# Patient Record
Sex: Male | Born: 1960 | Race: Black or African American | Hispanic: No | Marital: Single | Smoking: Current every day smoker
Health system: Southern US, Community
[De-identification: ages and names within clinical notes are randomized; demographics above are authoritative.]

## PROBLEM LIST (undated history)

## (undated) HISTORY — PX: FACIAL RECONSTRUCTION SURGERY: SHX631

---

## 2013-04-24 ENCOUNTER — Emergency Department: Payer: Self-pay | Admitting: Emergency Medicine

## 2013-04-24 LAB — COMPREHENSIVE METABOLIC PANEL
ALT: 109 U/L — AB (ref 12–78)
Albumin: 3.9 g/dL (ref 3.4–5.0)
Alkaline Phosphatase: 104 U/L
Anion Gap: 6 — ABNORMAL LOW (ref 7–16)
BILIRUBIN TOTAL: 0.5 mg/dL (ref 0.2–1.0)
BUN: 4 mg/dL — AB (ref 7–18)
CALCIUM: 8.8 mg/dL (ref 8.5–10.1)
CHLORIDE: 99 mmol/L (ref 98–107)
Co2: 28 mmol/L (ref 21–32)
Creatinine: 0.68 mg/dL (ref 0.60–1.30)
EGFR (Non-African Amer.): >60
GLUCOSE: 85 mg/dL (ref 65–99)
OSMOLALITY: 263 (ref 275–301)
POTASSIUM: 4.2 mmol/L (ref 3.5–5.1)
SGOT(AST): 140 U/L — ABNORMAL HIGH (ref 15–37)
Sodium: 133 mmol/L — ABNORMAL LOW (ref 136–145)
TOTAL PROTEIN: 9 g/dL — AB (ref 6.4–8.2)

## 2013-04-24 LAB — URINALYSIS, COMPLETE
Bacteria: NONE SEEN
Bilirubin,UR: NEGATIVE
Glucose,UR: NEGATIVE mg/dL (ref 0–75)
Ketone: NEGATIVE
LEUKOCYTE ESTERASE: NEGATIVE
Nitrite: NEGATIVE
Ph: 6 (ref 4.5–8.0)
Protein: 30
RBC,UR: <1 /HPF (ref 0–5)
SPECIFIC GRAVITY: 1.003 (ref 1.003–1.030)
Squamous Epithelial: NONE SEEN

## 2013-04-24 LAB — CBC
HCT: 43.7 % (ref 40.0–52.0)
HGB: 15 g/dL (ref 13.0–18.0)
MCH: 32.4 pg (ref 26.0–34.0)
MCHC: 34.3 g/dL (ref 32.0–36.0)
MCV: 95 fL (ref 80–100)
Platelet: 65 10*3/uL — ABNORMAL LOW (ref 150–440)
RBC: 4.62 10*6/uL (ref 4.40–5.90)
RDW: 14.4 % (ref 11.5–14.5)
WBC: 5.2 10*3/uL (ref 3.8–10.6)

## 2013-04-24 LAB — LIPASE, BLOOD: LIPASE: 376 U/L (ref 73–393)

## 2013-08-19 ENCOUNTER — Emergency Department: Payer: Self-pay | Admitting: Emergency Medicine

## 2015-09-10 ENCOUNTER — Emergency Department: Payer: Self-pay

## 2015-09-10 DIAGNOSIS — R55 Syncope and collapse: Secondary | ICD-10-CM | POA: Insufficient documentation

## 2015-09-10 LAB — CBC
HEMATOCRIT: 38.4 % — AB (ref 40.0–52.0)
HEMOGLOBIN: 13.2 g/dL (ref 13.0–18.0)
MCH: 33.7 pg (ref 26.0–34.0)
MCHC: 34.3 g/dL (ref 32.0–36.0)
MCV: 98.1 fL (ref 80.0–100.0)
Platelets: 115 10*3/uL — ABNORMAL LOW (ref 150–440)
RBC: 3.92 MIL/uL — AB (ref 4.40–5.90)
RDW: 14.2 % (ref 11.5–14.5)
WBC: 5.4 10*3/uL (ref 3.8–10.6)

## 2015-09-10 LAB — URINALYSIS COMPLETE WITH MICROSCOPIC (ARMC ONLY)
Bilirubin Urine: NEGATIVE
Glucose, UA: NEGATIVE mg/dL
HGB URINE DIPSTICK: NEGATIVE
KETONES UR: NEGATIVE mg/dL
NITRITE: NEGATIVE
PH: 6 (ref 5.0–8.0)
PROTEIN: NEGATIVE mg/dL
SPECIFIC GRAVITY, URINE: 1.005 (ref 1.005–1.030)

## 2015-09-10 LAB — BASIC METABOLIC PANEL
ANION GAP: 8 (ref 5–15)
BUN: 6 mg/dL (ref 6–20)
CHLORIDE: 97 mmol/L — AB (ref 101–111)
CO2: 28 mmol/L (ref 22–32)
Calcium: 9.1 mg/dL (ref 8.9–10.3)
Creatinine, Ser: 0.74 mg/dL (ref 0.61–1.24)
GFR calc non Af Amer: >60 mL/min (ref >60)
Glucose, Bld: 109 mg/dL — ABNORMAL HIGH (ref 65–99)
Potassium: 3.9 mmol/L (ref 3.5–5.1)
Sodium: 133 mmol/L — ABNORMAL LOW (ref 135–145)

## 2015-09-10 NOTE — ED Notes (Signed)
Patient reports thought he had eaten something funny so he went to the bathroom to have a bowel movement.  States when he got up to clean himself he got sweaty and passed out x 2.  Wife states that patient has had diarrhea x 2 weeks and complaining of pain down his entire left side.  Wife reports that patient head his head on the tub when he fell.  Wife also reports that patient has appeared off to her for the past 2 weeks.

## 2015-09-10 NOTE — ED Notes (Signed)
Patient here via EMS for syncopal episode while on the toilet. Admits to drinking several beers today. Wife was insistent that he come to the ER because he "passed out." Patient is ambulatory with steady gait. Initial BP 90's, with EMS pressure was 140's.

## 2015-09-11 ENCOUNTER — Emergency Department: Payer: Self-pay

## 2015-09-11 ENCOUNTER — Emergency Department
Admission: EM | Admit: 2015-09-11 | Discharge: 2015-09-11 | Disposition: A | Discharge status: Home / Self Care | Payer: Self-pay | Attending: Emergency Medicine | Admitting: Emergency Medicine

## 2015-09-11 DIAGNOSIS — R55 Syncope and collapse: Secondary | ICD-10-CM

## 2015-09-11 MED ORDER — IBUPROFEN 600 MG PO TABS
600.0000 mg | ORAL_TABLET | ORAL | Status: AC
  Administered 2015-09-11: 600 mg via ORAL
  Filled 2015-09-11: qty 1

## 2015-09-11 NOTE — ED Provider Notes (Signed)
[facility] Emergency Department Provider Note  ____________________________________________  Time seen: Approximately 4:24 AM  I have reviewed the triage vital signs and the nursing notes.   HISTORY  Chief Complaint Loss of Consciousness    HPI Willie Baker is a 55 y.o. male denies previous medical history.  Patient reports that he was using the bathroom today, had a somewhat loose stool and while using the bathroom "feel lightheaded and then passed out. His wife came in and stood him up and then he passed out a second time very briefly. He recalls event except for a brief episode where he passed out. There is no seizure. He is not any chest pain, palpitations, shortness of breath.  Currently reports he feels fine.  No past medical history on file.  There are no active problems to display for this patient.   No past surgical history on file.  No current outpatient prescriptions on file.  Allergies Review of patient's allergies indicates not on file.  No family history on file.  Social History Social History  Substance Use Topics  . Smoking status: Not on file  . Smokeless tobacco: Not on file  . Alcohol Use: Not on file  He does smoke, one pack a day He does use 2-3 beers daily Denies illicit drug use  Review of Systems Constitutional: No fever/chills Eyes: No visual changes. ENT: No sore throat. Cardiovascular: Denies chest pain. Respiratory: Denies shortness of breath. Gastrointestinal: No abdominal pain.  No nausea, no vomiting.  No diarrhea.  No constipation. Genitourinary: Negative for dysuria. Musculoskeletal: Negative for back pain. Reports the big toe feels slightly sore over his right foot, and no numbness tingling or weakness. Skin: Negative for rash. Neurological: Negative for headaches, focal weakness or numbness.  10-point ROS otherwise negative.  ____________________________________________   PHYSICAL  EXAM:  VITAL SIGNS: ED Triage Vitals  Enc Vitals Group     BP 09/10/15 1942 101/77 mmHg     Pulse Rate 09/10/15 1942 80     Resp 09/10/15 1942 20     Temp 09/10/15 1942 97.8 F (36.6 C)     Temp Source 09/10/15 1942 Oral     SpO2 09/10/15 1942 98 %     Weight 09/10/15 1942 170 lb (77.111 kg)     Height 09/10/15 1942 6\' 1"  (1.854 m)     Head Cir --      Peak Flow --      Pain Score 09/10/15 1943 4     Pain Loc --      Pain Edu? --      Excl. in GC? --    Constitutional: Alert and oriented. Well appearing and in no acute distress. Eyes: Conjunctivae are normal. PERRL. EOMI. Head: Atraumatic. Nose: No congestion/rhinnorhea. Mouth/Throat: Mucous membranes are moist.  Oropharynx non-erythematous. Neck: No stridor.  No cervical spine tenderness Cardiovascular: Normal rate, regular rhythm. Grossly normal heart sounds.  Good peripheral circulation. Respiratory: Normal respiratory effort.  No retractions. Lungs CTAB. Gastrointestinal: Soft and nontender. No distention.  Musculoskeletal: No lower extremity tenderness nor edema.  No joint effusions. Neurologic:  Normal speech and language. No gross focal neurologic deficits are appreciated. No gait instability. Skin:  Skin is warm, dry and intact. No rash noted. Psychiatric: Mood and affect are normal. Speech and behavior are normal.  ____________________________________________   LABS (all labs ordered are listed, but only abnormal results are displayed)  Labs Reviewed  BASIC METABOLIC PANEL - Abnormal; Notable for the following:  Sodium 133 (*)    Chloride 97 (*)    Glucose, Bld 109 (*)    All other components within normal limits  CBC - Abnormal; Notable for the following:    RBC 3.92 (*)    HCT 38.4 (*)    Platelets 115 (*)    All other components within normal limits  URINALYSIS COMPLETEWITH MICROSCOPIC ([facility] ONLY) - Abnormal; Notable for the following:    Color, Urine YELLOW (*)    APPearance CLEAR (*)     Leukocytes, UA TRACE (*)    Bacteria, UA RARE (*)    Squamous Epithelial / LPF 0-5 (*)    All other components within normal limits   ____________________________________________  EKG  ED ECG REPORT I, Baran Kuhrt, the attending physician, personally viewed and interpreted this ECG.  Date: 09/10/2015 EKG Time: 1953 Rate: 70 Rhythm: normal sinus rhythm QRS Axis: normal Intervals: normal ST/T Wave abnormalities: normal Conduction Disturbances: none Narrative Interpretation: unremarkable  ____________________________________________  RADIOLOGY  DG Foot Complete Right (Final result) Result time: 09/11/15 03:52:02   Final result by Rad Results In Interface (09/11/15 03:52:02)   Narrative:   CLINICAL DATA: Right foot pain. Medial pain, no known injury.  EXAM: RIGHT FOOT COMPLETE - 3+ VIEW  COMPARISON: None.  FINDINGS: There is no evidence of fracture or dislocation. Questionable bony erosion involving the lateral aspect of the first metatarsal head with overhanging edge. Minimal medial soft tissue thickening about the first metatarsal phalangeal joint.  IMPRESSION: Soft tissue thickening about the medial first metatarsal phalangeal joint. Questionable bony erosion at the first metatarsal head. Findings suggest gout.   Electronically Signed By: Rubye Oaks M.D. On: 09/11/2015 03:52          CT Head Wo Contrast (Final result) Result time: 09/10/15 21:39:33   Final result by Rad Results In Interface (09/10/15 21:39:33)   Narrative:   CLINICAL DATA: Acute onset of diaphoresis and syncope. Recent diarrhea. Hit head on tub. Initial encounter.  EXAM: CT HEAD WITHOUT CONTRAST  TECHNIQUE: Contiguous axial images were obtained from the base of the skull through the vertex without intravenous contrast.  COMPARISON: None.  FINDINGS: There is no evidence of acute infarction, mass lesion, or intra- or extra-axial hemorrhage on CT.  Mild  periventricular white matter change may reflect small vessel ischemic microangiopathy.  The posterior fossa, including the cerebellum, brainstem and fourth ventricle, is within normal limits. The third and lateral ventricles, and basal ganglia are unremarkable in appearance. The cerebral hemispheres are symmetric in appearance, with normal gray-white differentiation. No mass effect or midline shift is seen.  There is no evidence of fracture; visualized osseous structures are unremarkable in appearance. The visualized portions of the orbits are within normal limits. The paranasal sinuses and mastoid air cells are well-aerated. No significant soft tissue abnormalities are seen.  IMPRESSION: 1. No evidence of traumatic intracranial injury or fracture. 2. Mild small vessel ischemic microangiopathy.   Electronically Signed By: Roanna Raider M.D. On: 09/10/2015 21:39       ____________________________________________   PROCEDURES  Procedure(s) performed: None  Critical Care performed: No  ____________________________________________   INITIAL IMPRESSION / ASSESSMENT AND PLAN / ED COURSE  Pertinent labs & imaging results that were available during my care of the patient were reviewed by me and considered in my medical decision making (see chart for details).  Patient does for evaluation of syncope. He reports that he got lightheaded while having a bowel movement, started to lower himself and then fell striking  his head on the floor. He has no evidence of traumatic injury. Nexus negative. He is currently awake and alert with no evidence of neurologic cardiac or pulmonary condition or acuity. He is ambulatory without distress, reports all symptoms resolved. He denies any black or bloody stool, infectious symptoms, abdominal pain or other concerns at this time.  Patient is negative for high risk factors by [facility] syncope, no evidence of arrhythmia, he is  hemodynamically stable.  Labs reassuring. Discussed with the patient, and since he has no primary I suggested that he may wish to follow up and told him he should establish a primary care for further evaluation. Patient is agreeable. Wife will be driving him home. Provided work note for the next couple of days.  Return precautions and treatment recommendations and follow-up discussed with the patient who is agreeable with the plan.  ____________________________________________   FINAL CLINICAL IMPRESSION(S) / ED DIAGNOSES  Final diagnoses:  Syncope and collapse      Sharyn Creamer, MD 09/11/15 [id number]

## 2015-09-11 NOTE — ED Notes (Signed)
Pt presents to ED with c/o lower back pain radiating down right leg. Pt reports was using bathroom, was having diarrhea, when he attempted to stand patient reports became dizzy and diaphoretic and had syncopal episode x 2. Syncopal episode was witnessed by spouse, who states pt hit head on tub. Pt denies hx of the same. Pt alert and oriented x4, skin warm and dry, no increased work in breathing noted.

## 2015-09-11 NOTE — ED Notes (Signed)

## 2015-09-11 NOTE — Discharge Instructions (Signed)
You have been seen today in the Emergency Department (ED)  for syncope (passing out).  Your workup including labs and EKG show reassuring results.  Your symptoms may be due to dehydration, so it is important that you drink plenty of non-alcoholic fluids. ° °Please call your regular doctor as soon as possible to schedule the next available clinic appointment to follow up with him/her regarding your visit to the ED and your symptoms.  Return to the Emergency Department (ED)  if you have any further syncopal episodes (pass out again) or develop ANY chest pain, pressure, tightness, trouble breathing, sudden sweating, or other symptoms that concern you. ° ° ° °Syncope °Syncope is a medical term for fainting or passing out. This means you lose consciousness and drop to the ground. People are generally unconscious for less than 5 minutes. You may have some muscle twitches for up to 15 seconds before waking up and returning to normal. Syncope occurs more often in older adults, but it can happen to anyone. While most causes of syncope are not dangerous, syncope can be a sign of a serious medical problem. It is important to seek medical care.  °CAUSES  °Syncope is caused by a sudden drop in blood flow to the brain. The specific cause is often not determined. Factors that can bring on syncope include: °· Taking medicines that lower blood pressure. °· Sudden changes in posture, such as standing up quickly. °· Taking more medicine than prescribed. °· Standing in one place for too long. °· Seizure disorders. °· Dehydration and excessive exposure to heat. °· Low blood sugar (hypoglycemia). °· Straining to have a bowel movement. °· Heart disease, irregular heartbeat, or other circulatory problems. °· Fear, emotional distress, seeing blood, or severe pain. °SYMPTOMS  °Right before fainting, you may: °· Feel dizzy or light-headed. °· Feel nauseous. °· See all white or all black in your field of vision. °· Have cold, clammy  skin. °DIAGNOSIS  °Your health care provider will ask about your symptoms, perform a physical exam, and perform an electrocardiogram (ECG) to record the electrical activity of your heart. Your health care provider may also perform other heart or blood tests to determine the cause of your syncope which may include: °· Transthoracic echocardiogram (TTE). During echocardiography, sound waves are used to evaluate how blood flows through your heart. °· Transesophageal echocardiogram (TEE). °· Cardiac monitoring. This allows your health care provider to monitor your heart rate and rhythm in real time. °· Holter monitor. This is a portable device that records your heartbeat and can help diagnose heart arrhythmias. It allows your health care provider to track your heart activity for several days, if needed. °· Stress tests by exercise or by giving medicine that makes the heart beat faster. °TREATMENT  °In most cases, no treatment is needed. Depending on the cause of your syncope, your health care provider may recommend changing or stopping some of your medicines. °HOME CARE INSTRUCTIONS °· Have someone stay with you until you feel stable. °· Do not drive, use machinery, or play sports until your health care provider says it is okay. °· Keep all follow-up appointments as directed by your health care provider. °· Lie down right away if you start feeling like you might faint. Breathe deeply and steadily. Wait until all the symptoms have passed. °· Drink enough fluids to keep your urine clear or pale yellow. °· If you are taking blood pressure or heart medicine, get up slowly and take several minutes to sit   and then stand. This can reduce dizziness. °SEEK IMMEDIATE MEDICAL CARE IF:  °· You have a severe headache. °· You have unusual pain in the chest, abdomen, or back. °· You are bleeding from your mouth or rectum, or you have black or tarry stool. °· You have an irregular or very fast heartbeat. °· You have pain with  breathing. °· You have repeated fainting or seizure-like jerking during an episode. °· You faint when sitting or lying down. °· You have confusion. °· You have trouble walking. °· You have severe weakness. °· You have vision problems. °If you fainted, call your local emergency services (911 in [place] Do not drive yourself to the hospital.  °  °This information is not intended to replace advice given to you by your health care provider. Make sure you discuss any questions you have with your health care provider. °  °Document Released: 04/03/2005 Document Revised: 08/18/2014 Document Reviewed: 06/02/2011 °Elsevier Interactive Patient Education ©2016 [employer] ° °

## 2015-10-24 ENCOUNTER — Emergency Department
Admission: EM | Admit: 2015-10-24 | Discharge: 2015-10-24 | Disposition: A | Discharge status: Home / Self Care | Payer: Self-pay | Attending: Emergency Medicine | Admitting: Emergency Medicine

## 2015-10-24 DIAGNOSIS — Z5321 Procedure and treatment not carried out due to patient leaving prior to being seen by health care provider: Secondary | ICD-10-CM | POA: Insufficient documentation

## 2015-10-24 DIAGNOSIS — R066 Hiccough: Secondary | ICD-10-CM | POA: Insufficient documentation

## 2015-10-24 NOTE — ED Notes (Signed)
In room to triage pt and pt reports he is no longer having the hiccups which is what brought him to the ED. Pt states he is going to go home and will return if he needs later.

## 2016-05-24 ENCOUNTER — Inpatient Hospital Stay
Admission: EM | Admit: 2016-05-24 | Discharge: 2016-06-23 | DRG: 896 | Disposition: A | Discharge status: Hospice | Payer: Medicaid Other | Attending: Internal Medicine | Admitting: Internal Medicine

## 2016-05-24 ENCOUNTER — Encounter: Payer: Self-pay | Admitting: Emergency Medicine

## 2016-05-24 DIAGNOSIS — R112 Nausea with vomiting, unspecified: Secondary | ICD-10-CM

## 2016-05-24 DIAGNOSIS — E8809 Other disorders of plasma-protein metabolism, not elsewhere classified: Secondary | ICD-10-CM | POA: Diagnosis not present

## 2016-05-24 DIAGNOSIS — Z6821 Body mass index (BMI) 21.0-21.9, adult: Secondary | ICD-10-CM

## 2016-05-24 DIAGNOSIS — Z515 Encounter for palliative care: Secondary | ICD-10-CM | POA: Diagnosis not present

## 2016-05-24 DIAGNOSIS — E876 Hypokalemia: Secondary | ICD-10-CM | POA: Diagnosis present

## 2016-05-24 DIAGNOSIS — E860 Dehydration: Secondary | ICD-10-CM | POA: Diagnosis present

## 2016-05-24 DIAGNOSIS — R05 Cough: Secondary | ICD-10-CM

## 2016-05-24 DIAGNOSIS — Z7189 Other specified counseling: Secondary | ICD-10-CM

## 2016-05-24 DIAGNOSIS — F10251 Alcohol dependence with alcohol-induced psychotic disorder with hallucinations: Secondary | ICD-10-CM | POA: Diagnosis present

## 2016-05-24 DIAGNOSIS — F10231 Alcohol dependence with withdrawal delirium: Principal | ICD-10-CM | POA: Diagnosis present

## 2016-05-24 DIAGNOSIS — E871 Hypo-osmolality and hyponatremia: Secondary | ICD-10-CM

## 2016-05-24 DIAGNOSIS — Z4659 Encounter for fitting and adjustment of other gastrointestinal appliance and device: Secondary | ICD-10-CM

## 2016-05-24 DIAGNOSIS — R109 Unspecified abdominal pain: Secondary | ICD-10-CM

## 2016-05-24 DIAGNOSIS — K56600 Partial intestinal obstruction, unspecified as to cause: Secondary | ICD-10-CM | POA: Diagnosis present

## 2016-05-24 DIAGNOSIS — E43 Unspecified severe protein-calorie malnutrition: Secondary | ICD-10-CM | POA: Diagnosis present

## 2016-05-24 DIAGNOSIS — K567 Ileus, unspecified: Secondary | ICD-10-CM

## 2016-05-24 DIAGNOSIS — F0280 Dementia in other diseases classified elsewhere without behavioral disturbance: Secondary | ICD-10-CM | POA: Diagnosis present

## 2016-05-24 DIAGNOSIS — F1096 Alcohol use, unspecified with alcohol-induced persisting amnestic disorder: Secondary | ICD-10-CM

## 2016-05-24 DIAGNOSIS — R32 Unspecified urinary incontinence: Secondary | ICD-10-CM | POA: Diagnosis not present

## 2016-05-24 DIAGNOSIS — K56609 Unspecified intestinal obstruction, unspecified as to partial versus complete obstruction: Secondary | ICD-10-CM

## 2016-05-24 DIAGNOSIS — F1026 Alcohol dependence with alcohol-induced persisting amnestic disorder: Secondary | ICD-10-CM | POA: Diagnosis present

## 2016-05-24 DIAGNOSIS — R509 Fever, unspecified: Secondary | ICD-10-CM | POA: Diagnosis not present

## 2016-05-24 DIAGNOSIS — K7010 Alcoholic hepatitis without ascites: Secondary | ICD-10-CM | POA: Diagnosis present

## 2016-05-24 DIAGNOSIS — R000 Tachycardia, unspecified: Secondary | ICD-10-CM | POA: Diagnosis present

## 2016-05-24 DIAGNOSIS — F1010 Alcohol abuse, uncomplicated: Secondary | ICD-10-CM

## 2016-05-24 DIAGNOSIS — R059 Cough, unspecified: Secondary | ICD-10-CM

## 2016-05-24 DIAGNOSIS — Z66 Do not resuscitate: Secondary | ICD-10-CM | POA: Diagnosis not present

## 2016-05-24 DIAGNOSIS — G9340 Encephalopathy, unspecified: Secondary | ICD-10-CM | POA: Diagnosis present

## 2016-05-24 DIAGNOSIS — R410 Disorientation, unspecified: Secondary | ICD-10-CM

## 2016-05-24 DIAGNOSIS — F17200 Nicotine dependence, unspecified, uncomplicated: Secondary | ICD-10-CM | POA: Diagnosis present

## 2016-05-24 LAB — COMPREHENSIVE METABOLIC PANEL
ALT: 48 U/L (ref 17–63)
AST: 110 U/L — ABNORMAL HIGH (ref 15–41)
Albumin: 4 g/dL (ref 3.5–5.0)
Alkaline Phosphatase: 88 U/L (ref 38–126)
Anion gap: 9 (ref 5–15)
BUN: <5 mg/dL — ABNORMAL LOW (ref 6–20)
CHLORIDE: 93 mmol/L — AB (ref 101–111)
CO2: 26 mmol/L (ref 22–32)
Calcium: 8.9 mg/dL (ref 8.9–10.3)
Creatinine, Ser: 0.67 mg/dL (ref 0.61–1.24)
Glucose, Bld: 101 mg/dL — ABNORMAL HIGH (ref 65–99)
POTASSIUM: 3.9 mmol/L (ref 3.5–5.1)
SODIUM: 128 mmol/L — AB (ref 135–145)
Total Bilirubin: 1 mg/dL (ref 0.3–1.2)
Total Protein: 8.3 g/dL — ABNORMAL HIGH (ref 6.5–8.1)

## 2016-05-24 LAB — URINALYSIS, COMPLETE (UACMP) WITH MICROSCOPIC
BACTERIA UA: NONE SEEN
Bilirubin Urine: NEGATIVE
GLUCOSE, UA: NEGATIVE mg/dL
Hgb urine dipstick: NEGATIVE
KETONES UR: NEGATIVE mg/dL
Nitrite: NEGATIVE
PROTEIN: NEGATIVE mg/dL
Specific Gravity, Urine: 1.004 — ABNORMAL LOW (ref 1.005–1.030)
pH: 7 (ref 5.0–8.0)

## 2016-05-24 LAB — CBC
HEMATOCRIT: 39.4 % — AB (ref 40.0–52.0)
HEMOGLOBIN: 13.5 g/dL (ref 13.0–18.0)
MCH: 33.7 pg (ref 26.0–34.0)
MCHC: 34.2 g/dL (ref 32.0–36.0)
MCV: 98.5 fL (ref 80.0–100.0)
Platelets: 102 10*3/uL — ABNORMAL LOW (ref 150–440)
RBC: 4 MIL/uL — AB (ref 4.40–5.90)
RDW: 13.2 % (ref 11.5–14.5)
WBC: 5.9 10*3/uL (ref 3.8–10.6)

## 2016-05-24 LAB — LIPASE, BLOOD: LIPASE: 36 U/L (ref 11–51)

## 2016-05-24 MED ORDER — ONDANSETRON 4 MG PO TBDP
ORAL_TABLET | ORAL | Status: AC
  Filled 2016-05-24: qty 1

## 2016-05-24 MED ORDER — ONDANSETRON 4 MG PO TBDP
4.0000 mg | ORAL_TABLET | Freq: Once | ORAL | Status: AC | PRN
  Administered 2016-05-24: 4 mg via ORAL

## 2016-05-24 MED ORDER — PROCHLORPERAZINE EDISYLATE 5 MG/ML IJ SOLN
10.0000 mg | Freq: Once | INTRAMUSCULAR | Status: AC
  Administered 2016-05-24: 10 mg via INTRAVENOUS
  Filled 2016-05-24: qty 2

## 2016-05-24 MED ORDER — SODIUM CHLORIDE 0.9 % IV BOLUS (SEPSIS)
1000.0000 mL | Freq: Once | INTRAVENOUS | Status: AC
  Administered 2016-05-24: 1000 mL via INTRAVENOUS

## 2016-05-24 NOTE — ED Triage Notes (Signed)
Pt ambulatory to triage in NAD, report n/v/d x 3-4 days, reports fever/chills at home as well.

## 2016-05-24 NOTE — ED Provider Notes (Signed)
[facility] Emergency Department Provider Note  ____________________________________________   I have reviewed the triage vital signs and the nursing notes.   HISTORY  Chief Complaint Emesis   History limited by: Not Limited   HPI Willie Baker is a 56 y.o. male who presents to the emergency department today because of concern for nausea and vomiting. These symptoms have been going on for the past four days. The patient states that he has not been able to keep anything down. The patient has not noticed any blood in his emesis. States that he has also been having some diarrhea. He denies any fevers. Denies any abdominal pain. The patient does state that he drinks roughly 12 beers a day. Denies frequent OTC pain medication use.    History reviewed. No pertinent past medical history.  There are no active problems to display for this patient.   History reviewed. No pertinent surgical history.  Prior to Admission medications   Not on File    Allergies Milk-related compounds  History reviewed. No pertinent family history.  Social History Social History  Substance Use Topics  . Smoking status: Current Every Day Smoker  . Smokeless tobacco: Never Used  . Alcohol use Yes    Review of Systems  Constitutional: Negative for fever. Cardiovascular: Negative for chest pain. Respiratory: Negative for shortness of breath. Gastrointestinal: Negative for abdominal pain. Positive for nausea, vomiting and diarrhea.  Neurological: Negative for headaches, focal weakness or numbness.  10-point ROS otherwise negative.  ____________________________________________   PHYSICAL EXAM:  VITAL SIGNS: ED Triage Vitals  Enc Vitals Group     BP 05/24/16 1902 120/79     Pulse Rate 05/24/16 1902 (!) 103     Resp 05/24/16 1902 20     Temp 05/24/16 1902 99.9 F (37.7 C)     Temp Source 05/24/16 1902 Oral     SpO2 05/24/16 1902 98 %     Weight 05/24/16 1903  160 lb (72.6 kg)     Height 05/24/16 1903 6\' 1"  (1.854 m)     Head Circumference --      Peak Flow --      Pain Score 05/24/16 1904 9   Constitutional: Alert and oriented. Well appearing and in no distress. Eyes: Conjunctivae are normal. Normal extraocular movements. ENT   Head: Normocephalic and atraumatic.   Nose: No congestion/rhinnorhea.   Mouth/Throat: Mucous membranes are moist.   Neck: No stridor. Hematological/Lymphatic/Immunilogical: No cervical lymphadenopathy. Cardiovascular: Tachycardic, regular rhythm.  No murmurs, rubs, or gallops.  Respiratory: Normal respiratory effort without tachypnea nor retractions. Breath sounds are clear and equal bilaterally. No wheezes/rales/rhonchi. Gastrointestinal: Soft and non tender. No rebound. No guarding.  Genitourinary: Deferred Musculoskeletal: Normal range of motion in all extremities. No lower extremity edema. Neurologic:  Normal speech and language. No gross focal neurologic deficits are appreciated.  Skin:  Skin is warm, dry and intact. No rash noted. Psychiatric: Mood and affect are normal. Speech and behavior are normal. Patient exhibits appropriate insight and judgment.  ____________________________________________    LABS (pertinent positives/negatives)  Labs Reviewed  COMPREHENSIVE METABOLIC PANEL - Abnormal; Notable for the following:       Result Value   Sodium 128 (*)    Chloride 93 (*)    Glucose, Bld 101 (*)    BUN <5 (*)    Total Protein 8.3 (*)    AST 110 (*)    All other components within normal limits  CBC - Abnormal; Notable for  the following:    RBC 4.00 (*)    HCT 39.4 (*)    Platelets 102 (*)    All other components within normal limits  URINALYSIS, COMPLETE (UACMP) WITH MICROSCOPIC - Abnormal; Notable for the following:    Color, Urine YELLOW (*)    APPearance CLEAR (*)    Specific Gravity, Urine 1.004 (*)    Leukocytes, UA SMALL (*)    Squamous Epithelial / LPF 0-5 (*)    All other  components within normal limits  LIPASE, BLOOD     ____________________________________________   EKG  I, Phineas SemenGraydon Enaya Howze, attending physician, personally viewed and interpreted this EKG  EKG Time: 0210 Rate: 127 Rhythm: sinus tachycardia Axis: normal Intervals: qtc 451 QRS: narrow, q waves V1 ST changes: no st elevation Impression: abnormal ekg   ____________________________________________    RADIOLOGY  None  ____________________________________________   PROCEDURES  Procedures  ____________________________________________   INITIAL IMPRESSION / ASSESSMENT AND PLAN / ED COURSE  Pertinent labs & imaging results that were available during my care of the patient were reviewed by me and considered in my medical decision making (see chart for details).  Patient presented to the emergency department today because of concerns for nausea and vomiting. Blood work showed that his sodium 1 level was slightly decreased. No addition the patient was tachycardic. He did receive multiple IV fluid boluses here in the emergency department however remained tachycardic. This point I think the tachycardia is probably related both to dehydration and alcohol withdrawal. Will however plan on admission the hospital service for further workup and evaluation.  ____________________________________________   FINAL CLINICAL IMPRESSION(S) / ED DIAGNOSES  Final diagnoses:  Nausea and vomiting, intractability of vomiting not specified, unspecified vomiting type  Hyponatremia     Note: This dictation was prepared with Dragon dictation. Any transcriptional errors that result from this process are unintentional     Phineas SemenGraydon Rahi Chandonnet, MD 05/25/16 [phone]

## 2016-05-25 DIAGNOSIS — R000 Tachycardia, unspecified: Secondary | ICD-10-CM | POA: Diagnosis present

## 2016-05-25 LAB — TSH: TSH: 1.451 u[IU]/mL (ref 0.350–4.500)

## 2016-05-25 LAB — ETHANOL: Alcohol, Ethyl (B): <5 mg/dL (ref <5)

## 2016-05-25 MED ORDER — ADULT MULTIVITAMIN W/MINERALS CH
1.0000 | ORAL_TABLET | Freq: Every day | ORAL | Status: DC
  Administered 2016-05-25 – 2016-05-28 (×3): 1 via ORAL
  Filled 2016-05-25 (×4): qty 1

## 2016-05-25 MED ORDER — SODIUM CHLORIDE 0.9 % IV SOLN
INTRAVENOUS | Status: DC
  Administered 2016-05-25 – 2016-05-26 (×5): via INTRAVENOUS

## 2016-05-25 MED ORDER — DOCUSATE SODIUM 100 MG PO CAPS
100.0000 mg | ORAL_CAPSULE | Freq: Two times a day (BID) | ORAL | Status: DC
  Administered 2016-05-25 – 2016-06-20 (×13): 100 mg via ORAL
  Filled 2016-05-25 (×20): qty 1

## 2016-05-25 MED ORDER — VITAMIN B-1 100 MG PO TABS
100.0000 mg | ORAL_TABLET | Freq: Every day | ORAL | Status: DC
  Administered 2016-05-25 – 2016-06-12 (×7): 100 mg via ORAL
  Filled 2016-05-25 (×10): qty 1

## 2016-05-25 MED ORDER — PANTOPRAZOLE SODIUM 40 MG PO TBEC
40.0000 mg | DELAYED_RELEASE_TABLET | Freq: Every day | ORAL | Status: DC
  Administered 2016-05-25 – 2016-05-28 (×3): 40 mg via ORAL
  Filled 2016-05-25 (×3): qty 1

## 2016-05-25 MED ORDER — LORAZEPAM 0.5 MG PO TABS
1.0000 mg | ORAL_TABLET | Freq: Four times a day (QID) | ORAL | Status: DC | PRN
  Administered 2016-05-25: 21:00:00 1 mg via ORAL
  Filled 2016-05-25: qty 1

## 2016-05-25 MED ORDER — LORAZEPAM 2 MG/ML IJ SOLN
0.0000 mg | Freq: Four times a day (QID) | INTRAMUSCULAR | Status: AC
  Administered 2016-05-25: 23:00:00 2 mg via INTRAVENOUS
  Administered 2016-05-26 (×2): 4 mg via INTRAVENOUS
  Filled 2016-05-25 (×3): qty 2

## 2016-05-25 MED ORDER — FOLIC ACID 1 MG PO TABS
1.0000 mg | ORAL_TABLET | Freq: Every day | ORAL | Status: DC
  Administered 2016-05-25 – 2016-05-28 (×3): 1 mg via ORAL
  Filled 2016-05-25 (×3): qty 1

## 2016-05-25 MED ORDER — LORAZEPAM 2 MG/ML IJ SOLN
1.0000 mg | Freq: Four times a day (QID) | INTRAMUSCULAR | Status: DC | PRN
  Administered 2016-05-26: 1 mg via INTRAVENOUS
  Filled 2016-05-25 (×2): qty 1

## 2016-05-25 MED ORDER — ONDANSETRON HCL 4 MG PO TABS: 4.0000 mg | ORAL_TABLET | Freq: Four times a day (QID) | ORAL | Status: DC | PRN

## 2016-05-25 MED ORDER — SODIUM CHLORIDE 0.9 % IV BOLUS (SEPSIS)
1000.0000 mL | Freq: Once | INTRAVENOUS | Status: AC
  Administered 2016-05-25: 1000 mL via INTRAVENOUS

## 2016-05-25 MED ORDER — METOPROLOL TARTRATE 25 MG PO TABS
25.0000 mg | ORAL_TABLET | Freq: Two times a day (BID) | ORAL | Status: DC
  Administered 2016-05-25 – 2016-05-28 (×6): 25 mg via ORAL
  Filled 2016-05-25 (×6): qty 1

## 2016-05-25 MED ORDER — BOOST / RESOURCE BREEZE PO LIQD
1.0000 | Freq: Three times a day (TID) | ORAL | Status: DC
  Administered 2016-05-27: 1 via ORAL
  Administered 2016-05-27: 237 mL via ORAL
  Administered 2016-05-28: 0.5 via ORAL
  Administered 2016-05-31: 1 via ORAL

## 2016-05-25 MED ORDER — ACETAMINOPHEN 650 MG RE SUPP
650.0000 mg | Freq: Four times a day (QID) | RECTAL | Status: DC | PRN
  Administered 2016-05-28 – 2016-06-03 (×3): 650 mg via RECTAL
  Filled 2016-05-25 (×3): qty 1

## 2016-05-25 MED ORDER — LORAZEPAM 2 MG/ML IJ SOLN
1.0000 mg | Freq: Once | INTRAMUSCULAR | Status: AC
  Administered 2016-05-25: 1 mg via INTRAVENOUS
  Filled 2016-05-25: qty 1

## 2016-05-25 MED ORDER — ONDANSETRON HCL 4 MG/2ML IJ SOLN: 4.0000 mg | Freq: Four times a day (QID) | INTRAMUSCULAR | Status: DC | PRN

## 2016-05-25 MED ORDER — SODIUM CHLORIDE 0.9% FLUSH
3.0000 mL | Freq: Two times a day (BID) | INTRAVENOUS | Status: DC
  Administered 2016-05-25 – 2016-06-13 (×31): 3 mL via INTRAVENOUS
  Administered 2016-06-14: 21:00:00 10 mL via INTRAVENOUS
  Administered 2016-06-14 – 2016-06-15 (×2): 3 mL via INTRAVENOUS
  Administered 2016-06-15: 10 mL via INTRAVENOUS
  Administered 2016-06-16 – 2016-06-22 (×11): 3 mL via INTRAVENOUS

## 2016-05-25 MED ORDER — ACETAMINOPHEN 325 MG PO TABS
650.0000 mg | ORAL_TABLET | Freq: Four times a day (QID) | ORAL | Status: DC | PRN
  Administered 2016-05-25: 20:00:00 650 mg via ORAL
  Filled 2016-05-25: qty 2

## 2016-05-25 MED ORDER — THIAMINE HCL 100 MG/ML IJ SOLN
100.0000 mg | Freq: Every day | INTRAMUSCULAR | Status: DC
  Administered 2016-05-26 – 2016-06-13 (×12): 100 mg via INTRAVENOUS
  Filled 2016-05-25 (×14): qty 2

## 2016-05-25 MED ORDER — ENOXAPARIN SODIUM 40 MG/0.4ML ~~LOC~~ SOLN: 40.0000 mg | SUBCUTANEOUS | Status: DC

## 2016-05-25 MED ORDER — TIOTROPIUM BROMIDE MONOHYDRATE 18 MCG IN CAPS
18.0000 ug | ORAL_CAPSULE | Freq: Every day | RESPIRATORY_TRACT | Status: DC
  Administered 2016-05-25 – 2016-06-13 (×4): 18 ug via RESPIRATORY_TRACT
  Filled 2016-05-25 (×2): qty 5

## 2016-05-25 MED ORDER — LORAZEPAM 2 MG/ML IJ SOLN
0.0000 mg | Freq: Two times a day (BID) | INTRAMUSCULAR | Status: AC
Start: 2016-05-27 — End: 2016-05-28
  Administered 2016-05-27 (×2): 2 mg via INTRAVENOUS
  Administered 2016-05-27 – 2016-05-28 (×2): 4 mg via INTRAVENOUS
  Filled 2016-05-25: qty 2
  Filled 2016-05-25 (×4): qty 1
  Filled 2016-05-25: qty 2

## 2016-05-25 NOTE — H&P (Signed)
Willie Baker is an 56 y.o. male.   Chief Complaint: Abdominal pain HPI: The patient with no past medical history presents emergency department complaining of abdominal pain. He states that he has had multiple episodes of nonbloody nonbilious emesis today. The patient also states that he has been subjectively febrile. Upon presentation the patient did not have a fever but he was tachycardic. After multiple fluid boluses his tachycardia persisted as well as his nausea. The patient admits to being a drinker and has not had any liquor for more than 24 hours. Due to concern for potential withdrawal from alcohol and persistent tachycardia the emergency department staff called the hospitalist service for admission.  History reviewed. No pertinent past medical history.  Past Surgical History:  Procedure Laterality Date  . FACIAL RECONSTRUCTION SURGERY      Family History  Problem Relation Age of Onset  . Breast cancer Mother    Social History:  reports that he has been smoking.  He has never used smokeless tobacco. He reports that he drinks alcohol. He reports that he does not use drugs.  Allergies:  Allergies  Allergen Reactions  . Milk-Related Compounds     No prescriptions prior to admission.    Results for orders placed or performed during the hospital encounter of 05/24/16 (from the past 48 hour(s))  Lipase, blood     Status: None   Collection Time: 05/24/16  7:02 PM  Result Value Ref Range   Lipase 36 11 - 51 U/L  Comprehensive metabolic panel     Status: Abnormal   Collection Time: 05/24/16  7:02 PM  Result Value Ref Range   Sodium 128 (L) 135 - 145 mmol/L   Potassium 3.9 3.5 - 5.1 mmol/L   Chloride 93 (L) 101 - 111 mmol/L   CO2 26 22 - 32 mmol/L   Glucose, Bld 101 (H) 65 - 99 mg/dL   BUN <5 (L) 6 - 20 mg/dL   Creatinine, Ser 0.67 0.61 - 1.24 mg/dL   Calcium 8.9 8.9 - 10.3 mg/dL   Total Protein 8.3 (H) 6.5 - 8.1 g/dL   Albumin 4.0 3.5 - 5.0 g/dL   AST 110 (H) 15 - 41  U/L   ALT 48 17 - 63 U/L   Alkaline Phosphatase 88 38 - 126 U/L   Total Bilirubin 1.0 0.3 - 1.2 mg/dL   GFR calc non Af Amer >60 >60 mL/min   GFR calc Af Amer >60 >60 mL/min    Comment: (NOTE) The eGFR has been calculated using the CKD EPI equation. This calculation has not been validated in all clinical situations. eGFR's persistently <60 mL/min signify possible Chronic Kidney Disease.    Anion gap 9 5 - 15  CBC     Status: Abnormal   Collection Time: 05/24/16  7:02 PM  Result Value Ref Range   WBC 5.9 3.8 - 10.6 K/uL   RBC 4.00 (L) 4.40 - 5.90 MIL/uL   Hemoglobin 13.5 13.0 - 18.0 g/dL   HCT 39.4 (L) 40.0 - 52.0 %   MCV 98.5 80.0 - 100.0 fL   MCH 33.7 26.0 - 34.0 pg   MCHC 34.2 32.0 - 36.0 g/dL   RDW 13.2 11.5 - 14.5 %   Platelets 102 (L) 150 - 440 K/uL  Urinalysis, Complete w Microscopic     Status: Abnormal   Collection Time: 05/24/16  7:02 PM  Result Value Ref Range   Color, Urine YELLOW (A) YELLOW   APPearance CLEAR (A)  CLEAR   Specific Gravity, Urine 1.004 (L) 1.005 - 1.030   pH 7.0 5.0 - 8.0   Glucose, UA NEGATIVE NEGATIVE mg/dL   Hgb urine dipstick NEGATIVE NEGATIVE   Bilirubin Urine NEGATIVE NEGATIVE   Ketones, ur NEGATIVE NEGATIVE mg/dL   Protein, ur NEGATIVE NEGATIVE mg/dL   Nitrite NEGATIVE NEGATIVE   Leukocytes, UA SMALL (A) NEGATIVE   RBC / HPF 0-5 0 - 5 RBC/hpf   WBC, UA 0-5 0 - 5 WBC/hpf   Bacteria, UA NONE SEEN NONE SEEN   Squamous Epithelial / LPF 0-5 (A) NONE SEEN  TSH     Status: None   Collection Time: 05/24/16  7:02 PM  Result Value Ref Range   TSH 1.451 0.350 - 4.500 uIU/mL    Comment: Performed by a 3rd Generation assay with a functional sensitivity of <=0.01 uIU/mL.   No results found.  Review of Systems  Constitutional: Positive for fever (subjective). Negative for chills.  HENT: Negative for sore throat and tinnitus.   Eyes: Negative for blurred vision and redness.  Respiratory: Negative for cough and shortness of breath.    Cardiovascular: Negative for chest pain, palpitations, orthopnea and PND.  Gastrointestinal: Positive for abdominal pain, nausea and vomiting. Negative for diarrhea.  Genitourinary: Negative for dysuria, frequency and urgency.  Musculoskeletal: Negative for joint pain and myalgias.  Skin: Negative for rash.       No lesions  Neurological: Negative for speech change, focal weakness and weakness.  Endo/Heme/Allergies: Does not bruise/bleed easily.       No temperature intolerance  Psychiatric/Behavioral: Negative for depression and suicidal ideas.    Blood pressure (!) 145/80, pulse (!) 111, temperature 100.1 F (37.8 C), temperature source Oral, resp. rate 18, height 6' 1"  (1.854 m), weight 69.1 kg (152 lb 6 oz), SpO2 98 %. Physical Exam  Constitutional: He is oriented to person, place, and time. He appears well-developed and well-nourished. No distress.  HENT:  Head: Normocephalic and atraumatic.  Mouth/Throat: Oropharynx is clear and moist.  Eyes: Conjunctivae and EOM are normal. Pupils are equal, round, and reactive to light. No scleral icterus.  Neck: Normal range of motion. Neck supple. No JVD present. No tracheal deviation present. No thyromegaly present.  Cardiovascular: Normal rate, regular rhythm and normal heart sounds.  Exam reveals no gallop and no friction rub.   No murmur heard. Respiratory: Effort normal and breath sounds normal. No respiratory distress. He has no wheezes.  GI: Soft. Bowel sounds are normal. He exhibits no distension. There is no tenderness.  Genitourinary:  Genitourinary Comments: Deferred  Musculoskeletal: Normal range of motion. He exhibits no edema.  Lymphadenopathy:    He has no cervical adenopathy.  Neurological: He is alert and oriented to person, place, and time. No cranial nerve deficit.  Skin: Skin is warm and dry. No rash noted. No erythema.  Psychiatric: He has a normal mood and affect. His behavior is normal. Judgment and thought content  normal.     Assessment/Plan This is a 56 year old male admitted for persistent tachycardia. 1. Tachycardia: Sinus; persistent. Exacerbated by nausea and vomiting, relative dehydration and possible alcohol withdrawals. Continue to fluid resuscitate and monitor telemetry. 2. Intractable nausea and vomiting: Underlying cause is likely gastritis secondary to alcohol use. I started the patient on Protonix and we will address his nausea symptomatically. 3. Alcohol abuse: Symptoms likely represent withdrawal. I place the patient on a CIWA scale. 4. Hyponatremia: Secondary to the above. Hydrate with normal saline. Encourage by mouth intake.  Advance diet as tolerated. 5. DVT prophylaxis: Lovenox 6. GI prophylaxis: PPI The patient is a full code. Time spent on admission was inpatient care possibly 45 minutes  Harrie Foreman, MD 05/25/2016, 7:02 AM

## 2016-05-25 NOTE — Progress Notes (Signed)
[facility] [place] [facility]   PATIENT NAME: Willie Baker    MR#:  [id number]  DATE OF BIRTH:  04/06/1961  SUBJECTIVE:    Patient doing well. Remains tachycardic and still endorses epigastric pain and loose stools. No vomiting since admission. Says he was drinking 6-12 beers a day for a very long time. Understands it is an issue and is causing his medical issues.    REVIEW OF SYSTEMS:   Review of Systems  Constitutional: Negative for chills, fever and weight loss.  HENT: Negative for ear pain, hearing loss and tinnitus.   Eyes: Negative for blurred vision, double vision and photophobia.  Respiratory: Negative for cough, hemoptysis, sputum production and shortness of breath.   Cardiovascular: Negative for chest pain, palpitations and orthopnea.  Gastrointestinal: Positive for abdominal pain and diarrhea. Negative for blood in stool, heartburn, nausea and vomiting.  Genitourinary: Negative for dysuria, frequency and urgency.  Musculoskeletal: Negative for back pain, myalgias and neck pain.  Skin: Negative for itching and rash.  Neurological: Negative for dizziness, tingling and headaches.  Endo/Heme/Allergies: Negative for environmental allergies and polydipsia. Does not bruise/bleed easily.  Psychiatric/Behavioral: Negative for depression, substance abuse and suicidal ideas.    DRUG ALLERGIES:   Allergies  Allergen Reactions  . Milk-Related Compounds     VITALS:  Blood pressure (!) 145/80, pulse (!) 111, temperature 100.1 F (37.8 C), temperature source Oral, resp. rate 18, height 6\' 1"  (1.854 m), weight 69.1 kg (152 lb 6 oz), SpO2 98 %.  PHYSICAL EXAMINATION:   Physical Exam  GENERAL:  56 y.o.-year-old patient lying in the bed with no acute distress.  EYES: Pupils equal, round, reactive to light and accommodation. No scleral icterus. Extraocular muscles intact.  HEENT: Head atraumatic, normocephalic. Oropharynx and nasopharynx clear.  NECK:   Supple, no jugular venous distention. No thyroid enlargement, no tenderness.  LUNGS: Normal breath sounds bilaterally, no wheezing, rales, rhonchi. No use of accessory muscles of respiration.  CARDIOVASCULAR: S1, S2 normal. No murmurs, rubs, or gallops. Tachycardic. ABDOMEN: Soft, nontender, nondistended. Bowel sounds present. No organomegaly or mass.  EXTREMITIES: No cyanosis, clubbing or edema b/l.    NEUROLOGIC: Cranial nerves II through XII are intact. No focal Motor or sensory deficits b/l.   PSYCHIATRIC:  patient is alert and oriented x 3.  SKIN: No obvious rash, lesion, or ulcer.   LABORATORY PANEL:  CBC  Recent Labs Lab 05/24/16 1902  WBC 5.9  HGB 13.5  HCT 39.4*  PLT 102*    Chemistries   Recent Labs Lab 05/24/16 1902  NA 128*  K 3.9  CL 93*  CO2 26  GLUCOSE 101*  BUN <5*  CREATININE 0.67  CALCIUM 8.9  AST 110*  ALT 48  ALKPHOS 88  BILITOT 1.0   Cardiac Enzymes No results for input(s): TROPONINI in the last 168 hours. RADIOLOGY:  No results found. ASSESSMENT AND PLAN:   56 yo male with no past medical history presented to the ED with abdominal pain and non-bloody emesis. Admitted for persistent tachycardia.  1) Tachycardia - persistent, due to suspected alcohol withdrawals - Continue IVF  - Continue to monitor heart rate   2) Epigastric pain w/ intractable nausea and vomiting - likely due to gastritis secondary to alcohol abuse  - Continue IV Protonix - Address nausea symptomatically   3) Alcohol abuse  - Patient educated on importance of alcohol cessation  - Continue CIWA scale   4) Hyponatremia  - Sodium 128 yesterday, recheck  CMP and monitor levels - Advance to heart healthy diet   5) DVT prophylaxis: Lovenox  Case discussed with Care Management/Social Worker. Management plans discussed with the patient, family and they are in agreement.  CODE STATUS: full  DVT Prophylaxis: Lovenox  TOTAL TIME TAKING CARE OF THIS PATIENT: 20 minutes.   >50% time spent on counselling and coordination of care  POSSIBLE D/C IN 1 DAYS, DEPENDING ON CLINICAL CONDITION.    Bethena Midget,  Physician Assistant Student    This is a Consulting civil engineer note, solely for education purposes. This note was reviewed with and approved by attending preceptor.  CC: Primary care physician; No PCP Per Patient

## 2016-05-25 NOTE — Progress Notes (Signed)
Initial Nutrition Assessment  DOCUMENTATION CODES:   Non-severe (moderate) malnutrition in context of chronic illness  INTERVENTION:  Provide Boost Breeze po TID, each supplement provides 250 kcal and 9 grams of protein.  Encouraged adequate intake of calories and protein with meals and beverages to prevent further weight loss.   NUTRITION DIAGNOSIS:   Malnutrition (Moderate) related to chronic illness, poor appetite (abdominal pain, NV) as evidenced by energy intake < 75% for > or equal to 1 month, moderate depletions of muscle mass, moderate depletion of body fat.  GOAL:   Patient will meet greater than or equal to 90% of their needs  MONITOR:   PO intake, Supplement acceptance, Labs, I & O's, Weight trends  REASON FOR ASSESSMENT:   Malnutrition Screening Tool    ASSESSMENT:   56 year old male with no PMHx presented with abdominal pain and non-bloody emesis admitted for persistent tachycardia.    Spoke with patient at bedside. He reports he has had a poor appetite for the past 4-5 months. He has been experiencing absence of hunger (anorexia), abdominal pain, and N/V for the past 3-4 months. Patient reports he is only having one small meal per day (usually a sandwich or chicken nuggets from [employer] with no sides). Patient reports even when his appetite was good he would only eat 1-2 meals per day. 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 Patient is amenable to drinking Boost Breeze between meals.   Patient reports UBW "160-something" and that he has been losing weight. Per chart patient has lost 18 lbs (10.6% body weight) over 8 months, which is not significant for time frame.   Medications reviewed and include: Colace, folic acid 1 mg daily, multivitamin with minerals daily, pantoprazole, thiamine 100 mg daily, NS @ 125 ml/hr.   Labs reviewed: Sodium 128,  Chloride 93, BUN <5.   Nutrition-Focused physical exam completed. Findings are moderate fat depletion, moderate muscle depletion, and no edema.   Diet Order:  Diet Heart Room service appropriate? Yes; Fluid consistency: Thin  Skin:  Reviewed, no issues  Last BM:  05/25/2016  Height:   Ht Readings from Last 1 Encounters:  05/25/16 6\' 1"  (1.854 m)    Weight:   Wt Readings from Last 1 Encounters:  05/25/16 152 lb 6 oz (69.1 kg)    Ideal Body Weight:  83.6 kg  BMI:  Body mass index is 20.1 kg/m.  Estimated Nutritional Needs:   Kcal:  1900-2065 (MSJ x 1.2-1.3)  Protein:  70-83 grams (1-1.2 grams/kg)  Fluid:  2 L/day (30 ml/kg)  EDUCATION NEEDS:   No education needs identified at this time  Helane RimaLeanne Caldonia Leap, MS, RD, LDN Pager: [phone] After Hours Pager: [phone]

## 2016-05-25 NOTE — Progress Notes (Signed)
[facility] - [place] [facility]   PATIENT NAME: Willie Baker    MR#:  [id number]  DATE OF BIRTH:  07/22/1960  SUBJECTIVE:  Came in with nausea and vomiting and epigastric pain Tolerating CLD Wife in the room Wants to go home Remains tachycardic-denies palpitations Last ETOH drink 2 days ago--drinks heavy but minimizes  REVIEW OF SYSTEMS:   Review of Systems  Constitutional: Negative for chills, fever and weight loss.  HENT: Negative for ear discharge, ear pain and nosebleeds.   Eyes: Negative for blurred vision, pain and discharge.  Respiratory: Negative for sputum production, shortness of breath, wheezing and stridor.   Cardiovascular: Negative for chest pain, palpitations, orthopnea and PND.  Gastrointestinal: Positive for abdominal pain, nausea and vomiting. Negative for diarrhea.  Genitourinary: Negative for frequency and urgency.  Musculoskeletal: Negative for back pain and joint pain.  Neurological: Positive for weakness. Negative for sensory change, speech change and focal weakness.  Psychiatric/Behavioral: Negative for depression and hallucinations. The patient is not nervous/anxious.    Tolerating Diet:CLD Tolerating PT: not needed  DRUG ALLERGIES:   Allergies  Allergen Reactions  . Milk-Related Compounds     VITALS:  Blood pressure (!) 145/80, pulse (!) 111, temperature 100.1 F (37.8 C), temperature source Oral, resp. rate 18, height 6\' 1"  (1.854 m), weight 69.1 kg (152 lb 6 oz), SpO2 98 %.  PHYSICAL EXAMINATION:   Physical Exam  GENERAL:  56 y.o.-year-old patient lying in the bed with no acute distress.  EYES: Pupils equal, round, reactive to light and accommodation. No scleral icterus. Extraocular muscles intact.  HEENT: Head atraumatic, normocephalic. Oropharynx and nasopharynx clear.  NECK:  Supple, no jugular venous distention. No thyroid enlargement, no tenderness.  LUNGS: Normal breath sounds bilaterally, no wheezing,  rales, rhonchi. No use of accessory muscles of respiration.  CARDIOVASCULAR: S1, S2 normal. No murmurs, rubs, or gallops. tachycardia ABDOMEN: Soft, nontender, nondistended. Bowel sounds present. No organomegaly or mass.  EXTREMITIES: No cyanosis, clubbing or edema b/l.    NEUROLOGIC: Cranial nerves II through XII are intact. No focal Motor or sensory deficits b/l.   PSYCHIATRIC:  patient is alert and oriented x 3.  SKIN: No obvious rash, lesion, or ulcer.   LABORATORY PANEL:  CBC  Recent Labs Lab 05/24/16 1902  WBC 5.9  HGB 13.5  HCT 39.4*  PLT 102*    Chemistries   Recent Labs Lab 05/24/16 1902  NA 128*  K 3.9  CL 93*  CO2 26  GLUCOSE 101*  BUN <5*  CREATININE 0.67  CALCIUM 8.9  AST 110*  ALT 48  ALKPHOS 88  BILITOT 1.0   Cardiac Enzymes No results for input(s): TROPONINI in the last 168 hours. RADIOLOGY:  No results found. ASSESSMENT AND PLAN:   56 yo male with no past medical history presented to the ED with abdominal pain and non-bloody emesis. Admitted for persistent tachycardia.  1) Tachycardia - persistent, due to suspected alcohol withdrawal although scoring low on CIWA - Continue IVF  - Continue to monitor heart rate  -Po metoprolol  2) Epigastric pain w/ intractable nausea and vomiting - likely due to gastritis secondary to alcohol abuse  - Continue IV Protonix - Address nausea symptomatically  -tolerating CLD---advance diet  3) Alcohol abuse with ETOH induce hepatitis - Patient educated on importance of alcohol cessation  - Continue CIWA scale   4) Hyponatremia  - Sodium 128 yesterday, recheck CMP and monitor levels -check labs in am  5) DVT prophylaxis:SCD's  Has low ptls Case discussed with Care Management/Social Worker. Management plans discussed with the patient, family and they are in agreement.  CODE STATUS: full  DVT Prophylaxis: SCD. Pt ambulatory TOTAL TIME TAKING CARE OF THIS PATIENT: 25 minutes.  >50% time spent on  counselling and coordination of care pt ad wife POSSIBLE D/C IN **1-2DAYS, DEPENDING ON CLINICAL CONDITION.  Note: This dictation was prepared with Dragon dictation along with smaller phrase technology. Any transcriptional errors that result from this process are unintentional.  Willie Baker M.D on 05/25/2016 at 12:26 PM  Between 7am to 6pm - Pager - [phone]  After 6pm go to [email or web address] - Social research officer, government  [facility]  Office  [phone]  CC: Primary care physician; No PCP Per Patient

## 2016-05-25 NOTE — Plan of Care (Signed)
Dr. Text to inform of 2 loose BMS this morning.  However, patient states he has had loose BMS for several months.

## 2016-05-26 DIAGNOSIS — E871 Hypo-osmolality and hyponatremia: Secondary | ICD-10-CM | POA: Diagnosis present

## 2016-05-26 DIAGNOSIS — K56600 Partial intestinal obstruction, unspecified as to cause: Secondary | ICD-10-CM | POA: Diagnosis present

## 2016-05-26 DIAGNOSIS — R112 Nausea with vomiting, unspecified: Secondary | ICD-10-CM | POA: Diagnosis present

## 2016-05-26 DIAGNOSIS — R32 Unspecified urinary incontinence: Secondary | ICD-10-CM | POA: Diagnosis not present

## 2016-05-26 DIAGNOSIS — Z6821 Body mass index (BMI) 21.0-21.9, adult: Secondary | ICD-10-CM | POA: Diagnosis not present

## 2016-05-26 DIAGNOSIS — F17200 Nicotine dependence, unspecified, uncomplicated: Secondary | ICD-10-CM | POA: Diagnosis present

## 2016-05-26 DIAGNOSIS — R509 Fever, unspecified: Secondary | ICD-10-CM | POA: Diagnosis not present

## 2016-05-26 DIAGNOSIS — K7010 Alcoholic hepatitis without ascites: Secondary | ICD-10-CM | POA: Diagnosis present

## 2016-05-26 DIAGNOSIS — F10231 Alcohol dependence with withdrawal delirium: Secondary | ICD-10-CM | POA: Diagnosis present

## 2016-05-26 DIAGNOSIS — E8809 Other disorders of plasma-protein metabolism, not elsewhere classified: Secondary | ICD-10-CM | POA: Diagnosis not present

## 2016-05-26 DIAGNOSIS — F10251 Alcohol dependence with alcohol-induced psychotic disorder with hallucinations: Secondary | ICD-10-CM | POA: Diagnosis present

## 2016-05-26 DIAGNOSIS — E860 Dehydration: Secondary | ICD-10-CM | POA: Diagnosis present

## 2016-05-26 DIAGNOSIS — Z515 Encounter for palliative care: Secondary | ICD-10-CM | POA: Diagnosis not present

## 2016-05-26 DIAGNOSIS — Z66 Do not resuscitate: Secondary | ICD-10-CM | POA: Diagnosis not present

## 2016-05-26 DIAGNOSIS — G9340 Encephalopathy, unspecified: Secondary | ICD-10-CM | POA: Diagnosis present

## 2016-05-26 DIAGNOSIS — F0280 Dementia in other diseases classified elsewhere without behavioral disturbance: Secondary | ICD-10-CM | POA: Diagnosis present

## 2016-05-26 DIAGNOSIS — E43 Unspecified severe protein-calorie malnutrition: Secondary | ICD-10-CM | POA: Diagnosis present

## 2016-05-26 DIAGNOSIS — F1026 Alcohol dependence with alcohol-induced persisting amnestic disorder: Secondary | ICD-10-CM | POA: Diagnosis present

## 2016-05-26 DIAGNOSIS — E876 Hypokalemia: Secondary | ICD-10-CM | POA: Diagnosis present

## 2016-05-26 LAB — PHOSPHORUS: Phosphorus: 2.6 mg/dL (ref 2.5–4.6)

## 2016-05-26 LAB — POTASSIUM: Potassium: 3 mmol/L — ABNORMAL LOW (ref 3.5–5.1)

## 2016-05-26 LAB — MAGNESIUM: Magnesium: 1.3 mg/dL — ABNORMAL LOW (ref 1.7–2.4)

## 2016-05-26 LAB — HEMOGLOBIN A1C
Hgb A1c MFr Bld: 4.8 % (ref 4.8–5.6)
Mean Plasma Glucose: 91 mg/dL

## 2016-05-26 LAB — SODIUM: Sodium: 129 mmol/L — ABNORMAL LOW (ref 135–145)

## 2016-05-26 LAB — MRSA PCR SCREENING: MRSA by PCR: NEGATIVE

## 2016-05-26 LAB — GLUCOSE, CAPILLARY: Glucose-Capillary: 119 mg/dL — ABNORMAL HIGH (ref 65–99)

## 2016-05-26 MED ORDER — POTASSIUM CHLORIDE IN NACL 20-0.9 MEQ/L-% IV SOLN
INTRAVENOUS | Status: DC
  Administered 2016-05-26: 15:00:00 via INTRAVENOUS
  Administered 2016-05-27: 125 mL/h via INTRAVENOUS
  Administered 2016-05-27 – 2016-06-04 (×19): via INTRAVENOUS
  Filled 2016-05-26 (×29): qty 1000

## 2016-05-26 MED ORDER — LORAZEPAM 2 MG/ML IJ SOLN
4.0000 mg | Freq: Once | INTRAMUSCULAR | Status: AC
  Administered 2016-05-26: 4 mg via INTRAVENOUS
  Filled 2016-05-26: qty 2

## 2016-05-26 MED ORDER — LORAZEPAM 2 MG/ML IJ SOLN
2.0000 mg | Freq: Once | INTRAMUSCULAR | Status: AC
  Administered 2016-05-26: 02:00:00 2 mg via INTRAVENOUS

## 2016-05-26 MED ORDER — CHLORDIAZEPOXIDE HCL 5 MG PO CAPS
10.0000 mg | ORAL_CAPSULE | Freq: Once | ORAL | Status: AC
  Administered 2016-05-26: 10 mg via ORAL
  Filled 2016-05-26: qty 2

## 2016-05-26 MED ORDER — NICOTINE 14 MG/24HR TD PT24
14.0000 mg | MEDICATED_PATCH | Freq: Every day | TRANSDERMAL | Status: DC
  Administered 2016-05-26 – 2016-06-19 (×23): 14 mg via TRANSDERMAL
  Filled 2016-05-26 (×10): qty 1
  Filled 2016-05-26: qty 2
  Filled 2016-05-26 (×12): qty 1

## 2016-05-26 MED ORDER — MAGNESIUM SULFATE 4 GM/100ML IV SOLN
4.0000 g | Freq: Once | INTRAVENOUS | Status: AC
  Administered 2016-05-26: 4 g via INTRAVENOUS
  Filled 2016-05-26: qty 100

## 2016-05-26 MED ORDER — DEXMEDETOMIDINE HCL IN NACL 200 MCG/50ML IV SOLN
0.4000 ug/kg/h | INTRAVENOUS | Status: DC
  Administered 2016-05-26 (×2): 0.9 ug/kg/h via INTRAVENOUS
  Administered 2016-05-26: 1 ug/kg/h via INTRAVENOUS
  Administered 2016-05-26: 0.5 ug/kg/h via INTRAVENOUS
  Administered 2016-05-26 (×2): 0.8 ug/kg/h via INTRAVENOUS
  Administered 2016-05-27: 1.1 ug/kg/h via INTRAVENOUS
  Administered 2016-05-27: 1.2 ug/kg/h via INTRAVENOUS
  Administered 2016-05-27 (×2): 1.1 ug/kg/h via INTRAVENOUS
  Administered 2016-05-27: 1.2 ug/kg/h via INTRAVENOUS
  Administered 2016-05-27: 1.1 ug/kg/h via INTRAVENOUS
  Filled 2016-05-26 (×11): qty 50

## 2016-05-26 MED ORDER — HALOPERIDOL LACTATE 5 MG/ML IJ SOLN
5.0000 mg | Freq: Once | INTRAMUSCULAR | Status: AC
  Administered 2016-05-26: 5 mg via INTRAVENOUS

## 2016-05-26 MED ORDER — CHLORDIAZEPOXIDE HCL 10 MG PO CAPS: 10.0000 mg | ORAL_CAPSULE | Freq: Three times a day (TID) | ORAL | Status: DC

## 2016-05-26 MED ORDER — HALOPERIDOL LACTATE 5 MG/ML IJ SOLN
INTRAMUSCULAR | Status: AC
  Filled 2016-05-26: qty 1

## 2016-05-26 NOTE — Progress Notes (Signed)
05/26/16 0948  Charting Type  Charting Type Initial  Orders Chart Check (once per shift) Completed  Neurological  Neuro (WDL) X  Level of Consciousness Alert  Orientation Level Disoriented X4  Cognition Impulsive;Poor attention/concentration;Poor judgement;Poor safety awareness  Speech Slurred/Dysarthria  Pupil Assessment  Yes  R Pupil Size (mm) 3  R Pupil Shape Round  R Pupil Reaction Sluggish  L Pupil Size (mm) 3  L Pupil Shape Round  L Pupil Reaction Sluggish  Additional Pupil Assessments No  Motor Function/Sensation Assessment Motor response  RUE Motor Response Tremors;Purposeful movement  LUE Motor Response Purposeful movement;Tremors  RLE Motor Response Purposeful movement;Tremors  LLE Motor Response Tremors;Purposeful movement  Neuro Symptoms Agitation  Neuro symptoms relieved by Anti-anxiety medication  Glasgow Coma Scale  Eye Opening 4  Best Verbal Response (NON-intubated) 4  Best Motor Response 6  Glasgow Coma Scale Score 14  Richmond Agitation Sedation Scale  Richmond Agitation Sedation Scale (RASS) 3  RASS Goal 0  CAM-ICU Feature 1:  Acute onset of fluctuating course  Is the patient different from his/her baseline mental status? Yes  Has the patient had any fluctuation in mental status in the past 24 hours? Yes  CAM-ICU Feature 2:  Inattention  Number of errors greater than 2? Yes  CAM-ICU Feature 3:  Altered level of consciousness  RASS anything other than zero? Yes  CAM-ICU Feature 4:  Disorganized thinking  CAM-ICU Positive? Yes  Delirium Prevention:  Universal Requirements (Complete for all ICU patients)  Universal Precautions Initiated *See Row Information* Yes  Interventions for patient with positive CAM  CAM Positive:  Interventions Continue Universal (preventative) measures;Utilize bed alarms;Relocate patient close to nurses' station;Hide tubes (stockinette over IV line), consider removing tubes and drains if at all possible;Use minimum amount of  sedation for briefest duration possible  HEENT  HEENT (WDL) X  Teeth Missing (Comment)  Tongue Pink  Mucous Membrane(s) Moist  Voice Clear  Respiratory  Respiratory (WDL) WDL  Cardiac  Cardiac (WDL) X  Pulse Regular  Heart Sounds S1, S2;No adventitious heart sounds  Jugular Venous Distention (JVD) No  Cardiac Rhythm NSR  Telemetry Box Number [id number]  Tele Box Verification Completed by Second Verifier Completed  Antiarrhythmic device No  Vascular  Vascular (WDL) WDL  Integumentary  Integumentary (WDL) X  RN Assisting with Skin Assessment on Admission staci rn  Skin Color Appropriate for ethnicity  Skin Condition Dry;Flaky  Skin Integrity Intact  Skin Turgor Non-tenting  Braden Scale (Ages 8 and up)  Sensory Perceptions 4  Moisture 3  Activity 1  Mobility 3  Nutrition 2  Friction and Shear 3  Braden Scale Score 16  Braden Interventions  Braden Scale Interventions PUP Handout;Pillow;Heels  Musculoskeletal  Musculoskeletal (WDL) X  Assistive Device None  Generalized Weakness Yes  Weight Bearing Restrictions No  Gastrointestinal  Gastrointestinal (WDL) WDL  Last BM Date 05/25/16  GU Assessment  Genitourinary (WDL) WDL  Genitourinary Symptoms Other (Comment) (incontinence)  Genitalia  Male Genitalia Intact  Psychosocial  Psychosocial (WDL) X  Patient Behaviors Uncooperative;Agitated;Irritable;Aggressive physically;Restless  Needs Expressed Emotional  Emotional support given Given to patient

## 2016-05-26 NOTE — Progress Notes (Addendum)
CIWA 4. Pt will not allow lab to draw blood.  Sodium ordered for this morning.  Lab will return later today. Pt staying in bed but still disoriented to place, time, situation. Some visual  hallucinations. Henriette CombsSarah Seymone Forlenza RN [phone] Pt continues to try and get up.  Tried to assist pt with urinal but he insisted on getting up and became aggressive with staff. Security called again and two officers assisted with getting pt to lay back in bed.  Pt incontinent of urine and bed/gown was changed.  Charge nurse called [facility] doc and received order for another 4 mg IV Ativan.  CIWA 22 at this time.  VSS. Henriette CombsSarah Wyeth Hoffer RN

## 2016-05-26 NOTE — Progress Notes (Signed)
   05/26/16 1325  RN Transport Assessment (Complete at Beginning of Shift)  RN to accompany transport? Yes  Transport method Bed  Oxygen needed for transport? No  Does IV pole need to accompany transport? Yes  Patient RN's name and contact number [phone]  Hand-Off documentation  Report given to (Full Name) kelsey rn  Report received from (Full Name) Trixie Maclaren rn

## 2016-05-26 NOTE — Progress Notes (Signed)
Attempted to call and update significant other - no answer. Bo McclintockBrewer,Toneshia Coello S, RN

## 2016-05-26 NOTE — Progress Notes (Signed)
[facility] - [place] at [facility]   PATIENT NAME: Willie Baker    MR#:  [id number]  DATE OF BIRTH:  08/10/1960  SUBJECTIVE:   Patient went into severe alcohol withdrawal overnight required multiple doses of IV Ativan and Haldol. Remains agitated. Transferred to the intensive care unit earlier this morning and placed on a Precedex drip.  REVIEW OF SYSTEMS:   Review of Systems  Unable to perform ROS: Mental acuity   Tolerating Diet:CLD Tolerating PT: not needed  DRUG ALLERGIES:   Allergies  Allergen Reactions  . Milk-Related Compounds     VITALS:  Blood pressure (!) 163/91, pulse 68, temperature 97.6 F (36.4 C), temperature source Axillary, resp. rate (!) 22, height 6\' 1"  (1.854 m), weight 66.1 kg (145 lb 11.6 oz), SpO2 98 %.  PHYSICAL EXAMINATION:   Physical Exam  GENERAL:  56 y.o.-year-old patient lying in the bed lethargic/encephalopathic EYES: Pupils equal, round, reactive to light. No scleral icterus. Extraocular muscles intact.  HEENT: Head atraumatic, normocephalic. Oropharynx and nasopharynx clear.  NECK:  Supple, no jugular venous distention. No thyroid enlargement, no tenderness.  LUNGS: Normal breath sounds bilaterally, no wheezing, rales, rhonchi. No use of accessory muscles of respiration.  CARDIOVASCULAR: S1, S2 normal. No murmurs, rubs, or gallops. tachycardia ABDOMEN: Soft, nontender, nondistended. Bowel sounds present. No organomegaly or mass.  EXTREMITIES: No cyanosis, clubbing or edema b/l.    NEUROLOGIC: Cranial nerves II through XII are intact. No focal Motor or sensory deficits b/l.  Encephalopathic PSYCHIATRIC:  patient is alert and oriented x 1.  Encephalopathic.  SKIN: No obvious rash, lesion, or ulcer.   LABORATORY PANEL:  CBC  Recent Labs Lab 05/24/16 1902  WBC 5.9  HGB 13.5  HCT 39.4*  PLT 102*    Chemistries   Recent Labs Lab 05/24/16 1902 05/26/16 1014  NA 128* 129*  K 3.9 3.0*  CL 93*  --   CO2  26  --   GLUCOSE 101*  --   BUN <5*  --   CREATININE 0.67  --   CALCIUM 8.9  --   AST 110*  --   ALT 48  --   ALKPHOS 88  --   BILITOT 1.0  --    Cardiac Enzymes No results for input(s): TROPONINI in the last 168 hours. RADIOLOGY:  No results found. ASSESSMENT AND PLAN:   56 yo male with no past medical history presented to the ED with abdominal pain and non-bloody emesis. Admitted for persistent tachycardia.  1. Tachycardia-this is sinus tachycardia due to alcohol withdrawal.  - follow HR which has improved since yesterday.   2. Alcohol abuse/withdrawal-patient went to severe alcohol withdrawal overnight requiring multiple doses of Ativan and Haldol. -Continues to be agitated and attempting to get out of bed. Transfer to the intensive care unit and placed on Precedex drip. -Discontinue Librium, continue Ativan, Precedex drip for now.  3. Epigastric pain w/ intractable nausea and vomiting - likely due to gastritis secondary to alcohol abuse  - Continue IV Protonix  4. Hyponatremia -due to alcohol abuse -Continue IV fluids, sodium up to 129 today. Will monitor.   5. Hypokalemia - will cont. To supplement and repeat level in a.m.  - check Mg. Level.   Case discussed with Care Management/Social Worker. Management plans discussed with the patient, family and they are in agreement.  CODE STATUS: full  DVT Prophylaxis: Ted's & SCD's  TOTAL TIME TAKING CARE OF THIS PATIENT: 30 minutes.    Note:  This dictation was prepared with Dragon dictation along with smaller phrase technology. Any transcriptional errors that result from this process are unintentional.  Houston Siren M.D on 05/26/2016 at 4:09 PM  Between 7am to 6pm - Pager - [phone]  After 6pm go to [email or web address] - Social research officer, government  [facility]  Office  [phone]  CC: Primary care physician; No PCP Per Patient

## 2016-05-26 NOTE — Progress Notes (Signed)
Around 1330 two RN's attempted to assess skin and turn patient. Patient became combative and began to yell at nurses to leave him alone. Patient swatted at nurses. Nurses covered patient back up and left the room per patient request. Patient is now currently resting on a precedex drip at 0.278mcg. Will continue to assess.

## 2016-05-26 NOTE — Progress Notes (Signed)
Report given to MindenminesStacy, RN prior to transfer. Bo McclintockBrewer,Naama Sappington S, RN

## 2016-05-26 NOTE — Progress Notes (Signed)
MEDICATION RELATED CONSULT NOTE   Pharmacy Consult for electrolyte management  Pharmacy consulted for electrolyte management for 56 yo male admitted to ICU. Patient currently being treated under the CIWA protocol. Patient currently receiving NS at 14025mL/hr.    Plan:   Will add potassium 20mEq to IV fluids. This will provide 60mEq of potassium in 24 hour period.    Will replace magnesium 4g IV x 1.   Will recheck electrolytes with am labs.   Allergies  Allergen Reactions  . Milk-Related Compounds     Patient Measurements: Height: 6\' 1"  (185.4 cm) Weight: 145 lb 11.6 oz (66.1 kg) IBW/kg (Calculated) : 79.9  Vital Signs: Temp: 97.6 F (36.4 C) (02/09 1300) Temp Source: Axillary (02/09 1300) BP: 155/89 (02/09 1300) Pulse Rate: 68 (02/09 1300) Intake/Output from previous day: 02/08 0701 - 02/09 0700 In: 3022.9 [I.V.:3022.9] Out: 950 [Urine:950] Intake/Output from this shift: Total I/O In: 423.5 [I.V.:423.5] Out: 300 [Urine:300]  Labs:  Recent Labs  05/24/16 1902  WBC 5.9  HGB 13.5  HCT 39.4*  PLT 102*  CREATININE 0.67  ALBUMIN 4.0  PROT 8.3*  AST 110*  ALT 48  ALKPHOS 88  BILITOT 1.0   Estimated Creatinine Clearance: 98.7 mL/min (by C-G formula based on SCr of 0.67 mg/dL).   Microbiology: Recent Results (from the past 720 hour(s))  MRSA PCR Screening     Status: None   Collection Time: 05/26/16  9:57 AM  Result Value Ref Range Status   MRSA by PCR NEGATIVE NEGATIVE Final    Comment:        The GeneXpert MRSA Assay (FDA approved for NASAL specimens only), is one component of a comprehensive MRSA colonization surveillance program. It is not intended to diagnose MRSA infection nor to guide or monitor treatment for MRSA infections.     Pharmacy will continue to monitor and adjust per consult.    Tondalaya Perren L 05/26/2016,3:08 PM

## 2016-05-26 NOTE — Progress Notes (Signed)
CIWA 26.  Rapid called per protocol.  Dr Sheryle Hailiamond to unit and security as having difficultly keeping pt in bed with 3 people present at bedside.  Pt given 5 mg IV Haldol and continues to try to get out of bed.  Security remains on unit as pt is agitated and does not want to remain in bed.  Pt unsteady, disoriented, verbally aggressive, unable to reorient. Given verbal order to give 4 mg IV Ativan on schedule. Henriette CombsSarah Sander Speckman RN

## 2016-05-26 NOTE — Progress Notes (Addendum)
Unable to keep pt in bed.  Pt is becoming more disoriented to time and place.  Mild hallucination, sweating and aggressive.   Last CIWA is 12.  Pt has received 3 mg Ativan this shift with no decrease in CIWA. Spoke with Dr Anne HahnWillis and Librium ordered.  Awaiting rx from pharmacy. Bed alarm on and frequent rounding.  Henriette CombsSarah Wilmarie Sparlin RN Pt again out of bed.  Became very aggressive and was getting ready to push me.  Security code called and another nurse came into the room.  Able to persuade pt sit down on bed.  Called Dr Sheryle Hailiamond and received order for 2 mg IV Ativan.  Pt placed back into bed and alarm back on.  Wrapped IV with coban to keep IV from being pulled out.  Pt now with visual hallucination. Henriette CombsSarah Tasfia Vasseur RN 0300 CIWA now 23.  Pt with increased hallucinations, imaginarily drinking from a bottle, calling out for his girlfriend. Disoriented to place, time and situation. Very restless in bed. VSS. Tele sitter is active. Recheck CIWA in one hour. Henriette CombsSarah Jamontae Thwaites RN

## 2016-05-27 LAB — BASIC METABOLIC PANEL
ANION GAP: 8 (ref 5–15)
BUN: 6 mg/dL (ref 6–20)
CO2: 21 mmol/L — ABNORMAL LOW (ref 22–32)
Calcium: 8.4 mg/dL — ABNORMAL LOW (ref 8.9–10.3)
Chloride: 103 mmol/L (ref 101–111)
Creatinine, Ser: 0.42 mg/dL — ABNORMAL LOW (ref 0.61–1.24)
Glucose, Bld: 111 mg/dL — ABNORMAL HIGH (ref 65–99)
Potassium: 3.4 mmol/L — ABNORMAL LOW (ref 3.5–5.1)
SODIUM: 132 mmol/L — AB (ref 135–145)

## 2016-05-27 LAB — PHOSPHORUS: Phosphorus: 3.5 mg/dL (ref 2.5–4.6)

## 2016-05-27 LAB — MAGNESIUM: MAGNESIUM: 1.8 mg/dL (ref 1.7–2.4)

## 2016-05-27 MED ORDER — LORAZEPAM 1 MG PO TABS: 1.0000 mg | ORAL_TABLET | ORAL | Status: DC | PRN

## 2016-05-27 MED ORDER — LORAZEPAM 2 MG/ML IJ SOLN
1.0000 mg | INTRAMUSCULAR | Status: DC | PRN
  Administered 2016-05-27 – 2016-06-02 (×25): 2 mg via INTRAVENOUS
  Administered 2016-06-03: 15:00:00 1 mg via INTRAVENOUS
  Administered 2016-06-03 – 2016-06-08 (×8): 2 mg via INTRAVENOUS
  Filled 2016-05-27 (×30): qty 1

## 2016-05-27 MED ORDER — DEXMEDETOMIDINE HCL IN NACL 200 MCG/50ML IV SOLN
0.2000 ug/kg/h | INTRAVENOUS | Status: AC
Start: 2016-05-27 — End: 2016-05-28
  Administered 2016-05-27 – 2016-05-28 (×3): 0.7 ug/kg/h via INTRAVENOUS
  Administered 2016-05-28: 0.6 ug/kg/h via INTRAVENOUS
  Administered 2016-05-28 (×2): 0.7 ug/kg/h via INTRAVENOUS
  Filled 2016-05-27 (×7): qty 50

## 2016-05-27 MED ORDER — LORAZEPAM 2 MG/ML IJ SOLN
1.0000 mg | Freq: Four times a day (QID) | INTRAMUSCULAR | Status: DC | PRN
Start: 2016-05-27 — End: 2016-06-08
  Administered 2016-05-29 – 2016-06-08 (×9): 1 mg via INTRAVENOUS
  Filled 2016-05-27 (×13): qty 1

## 2016-05-27 MED ORDER — HALOPERIDOL LACTATE 5 MG/ML IJ SOLN
2.5000 mg | Freq: Four times a day (QID) | INTRAMUSCULAR | Status: DC | PRN
  Administered 2016-05-27 – 2016-06-02 (×5): 2.5 mg via INTRAVENOUS
  Filled 2016-05-27 (×5): qty 1

## 2016-05-27 NOTE — Progress Notes (Signed)
1230 Attempting out of bed. Incontient of urine. Fighting and cursing. Pushed nurse.Bed changed. 1315 attempting out of bed..Used urinal.

## 2016-05-27 NOTE — Progress Notes (Signed)
Patient not voided this shift.   Brief on and dry.  Asked patient if he had to void, became very agitated, cursed at nurse and say to leave him alone.  Unable to persuade to void.  Will attempt later.

## 2016-05-27 NOTE — Progress Notes (Addendum)
0800 Agitated and theatening physical violence. Precedex at max of 1.2. 0900 talking with MD but cursing loudly. 1000 refused medications, water and boost.1100 Attempted to get out of bed. Fighjting and cursing. Took 1000 medications. Incontinent. of urine. Bathed and changed. Late note [phone] given 2mg  of Ativan early for extreme agitation.

## 2016-05-27 NOTE — Progress Notes (Signed)
[facility] - [place] [facility]   PATIENT NAME: Willie Baker    MR#:  [id number]  DATE OF BIRTH:  06/02/1960  SUBJECTIVE:   Remains on Precedex gtt but still waking up and requiring intermittent doses of Ativan, Haldol.    REVIEW OF SYSTEMS:   Review of Systems  Unable to perform ROS: Mental acuity   Tolerating Diet: NO  DRUG ALLERGIES:   Allergies  Allergen Reactions  . Milk-Related Compounds     VITALS:  Blood pressure (!) 158/90, pulse 82, temperature 99.3 F (37.4 C), resp. rate (!) 22, height 6\' 1"  (1.854 m), weight 67.2 kg (148 lb 2.4 oz), SpO2 95 %.  PHYSICAL EXAMINATION:   Physical Exam  GENERAL:  56 y.o.-year-old patient lying in the bed lethargic/encephalopathic EYES: Pupils equal, round, reactive to light. No scleral icterus. Extraocular muscles intact.  HEENT: Head atraumatic, normocephalic. Oropharynx and nasopharynx clear.  NECK:  Supple, no jugular venous distention. No thyroid enlargement, no tenderness.  LUNGS: Normal breath sounds bilaterally, no wheezing, rales, rhonchi. No use of accessory muscles of respiration.  CARDIOVASCULAR: S1, S2 normal. No murmurs, rubs, or gallops. tachycardia ABDOMEN: Soft, nontender, nondistended. Bowel sounds present. No organomegaly or mass.  EXTREMITIES: No cyanosis, clubbing or edema b/l.    NEUROLOGIC: Cranial nerves II through XII are intact. No focal Motor or sensory deficits b/l.  Encephalopathic PSYCHIATRIC:  patient is alert and oriented x 1.  Encephalopathic.  SKIN: No obvious rash, lesion, or ulcer.   LABORATORY PANEL:  CBC  Recent Labs Lab 05/24/16 1902  WBC 5.9  HGB 13.5  HCT 39.4*  PLT 102*    Chemistries   Recent Labs Lab 05/24/16 1902  05/27/16 0555  NA 128*  < > 132*  K 3.9  < > 3.4*  CL 93*  --  103  CO2 26  --  21*  GLUCOSE 101*  --  111*  BUN <5*  --  6  CREATININE 0.67  --  0.42*  CALCIUM 8.9  --  8.4*  MG  --   < > 1.8  AST 110*  --   --   ALT 48   --   --   ALKPHOS 88  --   --   BILITOT 1.0  --   --   < > = values in this interval not displayed. Cardiac Enzymes No results for input(s): TROPONINI in the last 168 hours. RADIOLOGY:  No results found. ASSESSMENT AND PLAN:   56 yo male with no past medical history presented to the ED with abdominal pain and non-bloody emesis. Admitted for persistent tachycardia.  1. Tachycardia-this is sinus tachycardia due to alcohol withdrawal.  - follow HR which has improved.    2. Alcohol abuse/withdrawal-Continue Precedex drip, IV Ativan, IV Haldol as needed.  3. Epigastric pain w/ intractable nausea and vomiting - likely due to gastritis secondary to alcohol abuse  - Continue IV Protonix  4. Hyponatremia -due to alcohol abuse -Continue IV fluids, sodium up to 132 today. Will monitor.   5. Hypokalemia - will cont. To supplement and repeat level in a.m.  - Mg. Level normal.   Case discussed with Care Management/Social Worker. Management plans discussed with the patient, family and they are in agreement.  CODE STATUS: full  DVT Prophylaxis: Ted's & SCD's  TOTAL TIME TAKING CARE OF THIS PATIENT: 25 minutes.    Note: This dictation was prepared with Dragon dictation along with smaller phrase technology. Any transcriptional errors  that result from this process are unintentional.  Houston Siren M.D on 05/27/2016 at 2:47 PM  Between 7am to 6pm - Pager - [phone]  After 6pm go to [email or web address] - Social research officer, government  [facility]  Office  [phone]  CC: Primary care physician; No PCP Per Patient

## 2016-05-27 NOTE — Progress Notes (Signed)
MEDICATION RELATED CONSULT NOTE   Pharmacy Consult for electrolyte management  Pharmacy consulted for electrolyte management for 56 yo male admitted to ICU. Patient currently being treated under the CIWA protocol. Patient currently receiving NS with 20 mEq @  15425mL/hr.    Plan:   K slightly below goal however trending up therefore will continue the same. All other labs wnl.   Will recheck electrolytes with am labs.   Allergies  Allergen Reactions  . Milk-Related Compounds     Patient Measurements: Height: 6\' 1"  (185.4 cm) Weight: 148 lb 2.4 oz (67.2 kg) IBW/kg (Calculated) : 79.9  Vital Signs: Temp: 98 F (36.7 C) (02/10 0200) Temp Source: Oral (02/10 0200) BP: 147/83 (02/10 0700) Pulse Rate: 73 (02/10 0700) Intake/Output from previous day: 02/09 0701 - 02/10 0700 In: 2185.2 [I.V.:2085.2; IV Piggyback:100] Out: 875 [Urine:875] Intake/Output from this shift: No intake/output data recorded.  Labs:  Recent Labs  05/24/16 1902 05/26/16 1014 05/27/16 0555  WBC 5.9  --   --   HGB 13.5  --   --   HCT 39.4*  --   --   PLT 102*  --   --   CREATININE 0.67  --  0.42*  MG  --  1.3* 1.8  PHOS  --  2.6 3.5  ALBUMIN 4.0  --   --   PROT 8.3*  --   --   AST 110*  --   --   ALT 48  --   --   ALKPHOS 88  --   --   BILITOT 1.0  --   --    Estimated Creatinine Clearance: 100.3 mL/min (by C-G formula based on SCr of 0.42 mg/dL (L)).   Microbiology: Recent Results (from the past 720 hour(s))  MRSA PCR Screening     Status: None   Collection Time: 05/26/16  9:57 AM  Result Value Ref Range Status   MRSA by PCR NEGATIVE NEGATIVE Final    Comment:        The GeneXpert MRSA Assay (FDA approved for NASAL specimens only), is one component of a comprehensive MRSA colonization surveillance program. It is not intended to diagnose MRSA infection nor to guide or monitor treatment for MRSA infections.     Pharmacy will continue to monitor and adjust per consult.     Wenda Vanschaick D 05/27/2016,8:37 AM

## 2016-05-28 LAB — BASIC METABOLIC PANEL
ANION GAP: 8 (ref 5–15)
BUN: 6 mg/dL (ref 6–20)
CALCIUM: 8.2 mg/dL — AB (ref 8.9–10.3)
CO2: 20 mmol/L — AB (ref 22–32)
Chloride: 102 mmol/L (ref 101–111)
Creatinine, Ser: 0.58 mg/dL — ABNORMAL LOW (ref 0.61–1.24)
GFR calc non Af Amer: >60 mL/min (ref >60)
GLUCOSE: 109 mg/dL — AB (ref 65–99)
POTASSIUM: 3.1 mmol/L — AB (ref 3.5–5.1)
Sodium: 130 mmol/L — ABNORMAL LOW (ref 135–145)

## 2016-05-28 LAB — MAGNESIUM
MAGNESIUM: 1.9 mg/dL (ref 1.7–2.4)
Magnesium: 1.3 mg/dL — ABNORMAL LOW (ref 1.7–2.4)

## 2016-05-28 LAB — POTASSIUM: POTASSIUM: 3.3 mmol/L — AB (ref 3.5–5.1)

## 2016-05-28 MED ORDER — DEXMEDETOMIDINE HCL IN NACL 200 MCG/50ML IV SOLN
0.4000 ug/kg/h | INTRAVENOUS | Status: DC
  Administered 2016-05-28: 0.9 ug/kg/h via INTRAVENOUS
  Administered 2016-05-28: 1 ug/kg/h via INTRAVENOUS
  Administered 2016-05-28 – 2016-05-29 (×7): 1.2 ug/kg/h via INTRAVENOUS
  Administered 2016-05-29: 0.9 ug/kg/h via INTRAVENOUS
  Administered 2016-05-29: 1.2 ug/kg/h via INTRAVENOUS
  Administered 2016-05-29: 0.8 ug/kg/h via INTRAVENOUS
  Administered 2016-05-30: 0.6 ug/kg/h via INTRAVENOUS
  Administered 2016-05-30 (×3): 1 ug/kg/h via INTRAVENOUS
  Administered 2016-05-30: 0.8 ug/kg/h via INTRAVENOUS
  Filled 2016-05-28 (×14): qty 50

## 2016-05-28 MED ORDER — MAGNESIUM SULFATE 4 GM/100ML IV SOLN
4.0000 g | Freq: Once | INTRAVENOUS | Status: AC
  Administered 2016-05-28: 4 g via INTRAVENOUS
  Filled 2016-05-28: qty 100

## 2016-05-28 MED ORDER — SODIUM CHLORIDE 0.9 % IV SOLN
30.0000 meq | Freq: Once | INTRAVENOUS | Status: AC
  Administered 2016-05-28: 30 meq via INTRAVENOUS
  Filled 2016-05-28: qty 15

## 2016-05-28 MED ORDER — ORAL CARE MOUTH RINSE
15.0000 mL | Freq: Two times a day (BID) | OROMUCOSAL | Status: DC
  Administered 2016-05-28 – 2016-06-20 (×18): 15 mL via OROMUCOSAL

## 2016-05-28 NOTE — Progress Notes (Signed)
Pt awake conversing with wife.  Pt has slurred speech noted.  Pt trying to get up to "Go to the bathroom."  Pt cussing at wife and staff.  Hitting hands on side rail, attempting to get out of bed.  Precedex gtt increased back to 0.6 see MAR

## 2016-05-28 NOTE — Progress Notes (Signed)
CCU NP notified of fever not responsive to tylenol.  All other VSS.  Cooling blanket ordered.

## 2016-05-28 NOTE — Progress Notes (Signed)
[facility] - [place] at [facility]   PATIENT NAME: Willie Baker    MR#:  [id number]  DATE OF BIRTH:  [birth year]  SUBJECTIVE:   Remains on Precedex gtt and when weaned gets agitated and starts getting upset and uses foul language.  Low grade fever noted.   REVIEW OF SYSTEMS:   Review of Systems  Unable to perform ROS: Mental acuity  Constitutional: Positive for fever.   Tolerating Diet: NO  DRUG ALLERGIES:   Allergies  Allergen Reactions  . Milk-Related Compounds     VITALS:  Blood pressure (!) 159/91, pulse 74, temperature (!) 100.9 F (38.3 C), temperature source Axillary, resp. rate (!) 22, height 6\' 1"  (1.854 m), weight 68.1 kg (150 lb 2.1 oz), SpO2 98 %.  PHYSICAL EXAMINATION:   Physical Exam  GENERAL:  56 y.o.-year-old patient lying in the bed lethargic/encephalopathic EYES: Pupils equal, round, reactive to light. No scleral icterus. Extraocular muscles intact.  HEENT: Head atraumatic, normocephalic. Oropharynx and nasopharynx clear. Dry Oral Mucosa NECK:  Supple, no jugular venous distention. No thyroid enlargement, no tenderness.  LUNGS: Normal breath sounds bilaterally, no wheezing, rales, rhonchi. No use of accessory muscles of respiration.  CARDIOVASCULAR: S1, S2 normal. No murmurs, rubs, or gallops. tachycardia ABDOMEN: Soft, nontender, nondistended. Bowel sounds present. No organomegaly or mass.  EXTREMITIES: No cyanosis, clubbing or edema b/l.    NEUROLOGIC: Cranial nerves II through XII are intact. No focal Motor or sensory deficits b/l.  Encephalopathic PSYCHIATRIC:  patient is alert and oriented x 1.  Encephalopathic.  SKIN: No obvious rash, lesion, or ulcer.   LABORATORY PANEL:  CBC  Recent Labs Lab 05/24/16 1902  WBC 5.9  HGB 13.5  HCT 39.4*  PLT 102*    Chemistries   Recent Labs Lab 05/24/16 1902  05/28/16 0628  NA 128*  < > 130*  K 3.9  < > 3.1*  CL 93*  < > 102  CO2 26  < > 20*  GLUCOSE 101*  < > 109*   BUN <5*  < > 6  CREATININE 0.67  < > 0.58*  CALCIUM 8.9  < > 8.2*  MG  --   < > 1.3*  AST 110*  --   --   ALT 48  --   --   ALKPHOS 88  --   --   BILITOT 1.0  --   --   < > = values in this interval not displayed. Cardiac Enzymes No results for input(s): TROPONINI in the last 168 hours. RADIOLOGY:  No results found. ASSESSMENT AND PLAN:   56 yo male with no past medical history presented to the ED with abdominal pain and non-bloody emesis. Admitted for persistent tachycardia.  1. Tachycardia-this is sinus tachycardia due to alcohol withdrawal.  - follow HR which has improved.    2. Alcohol abuse/withdrawal-Continue Precedex drip, IV Ativan, IV Haldol as needed. - very slow to improve.    3. Epigastric pain w/ intractable nausea and vomiting - likely due to gastritis secondary to alcohol abuse  - Continue IV Protonix  4. Hyponatremia -due to alcohol abuse -Continue IV fluids, sodium up to 130 today. Will monitor.   5. Hypokalemia - will cont. To supplement and repeat level in a.m.  - Mg. Level was 1.3 and also given IV Mg.    Case discussed with Care Management/Social Worker. Management plans discussed with the patient, family and they are in agreement.  CODE STATUS: full  DVT Prophylaxis: Ted's &  SCD's  TOTAL TIME TAKING CARE OF THIS PATIENT: 25 minutes.    Note: This dictation was prepared with Dragon dictation along with smaller phrase technology. Any transcriptional errors that result from this process are unintentional.  Houston SirenSAINANI,Dietrich Samuelson J M.D on 05/28/2016 at 2:35 PM  Between 7am to 6pm - Pager - [phone]  After 6pm go to [email or web address] - Social research officer, governmentpassword [employer]AS [facility]  [facility]  Office  [phone]  CC: Primary care physician; No PCP Per Patient

## 2016-05-28 NOTE — Progress Notes (Addendum)
MEDICATION RELATED CONSULT NOTE   Pharmacy Consult for electrolyte management  Pharmacy consulted for electrolyte management for 56 yo male admitted to ICU. Patient currently being treated under the CIWA protocol. Patient currently receiving NS with 20 mEq @  12625mL/hr.    K= 3.1; Magnesium: 1.3   Plan:    Will give Magnesium 4 g IV x 1. Will recheck K and Magnesium @ 18:00. K may be falsely low due to hypomagensia.   Allergies  Allergen Reactions  . Milk-Related Compounds     Patient Measurements: Height: 6\' 1"  (185.4 cm) Weight: 150 lb 2.1 oz (68.1 kg) IBW/kg (Calculated) : 79.9  Vital Signs: Temp: 99.3 F (37.4 C) (02/11 0500) Temp Source: Axillary (02/11 0500) BP: 96/61 (02/11 0600) Pulse Rate: 92 (02/11 0600) Intake/Output from previous day: 02/10 0701 - 02/11 0700 In: 3410.4 [P.O.:240; I.V.:3170.4] Out: 800 [Urine:800] Intake/Output from this shift: No intake/output data recorded.  Labs:  Recent Labs  05/26/16 1014 05/27/16 0555 05/28/16 0628  CREATININE  --  0.42* 0.58*  MG 1.3* 1.8 1.3*  PHOS 2.6 3.5  --    Estimated Creatinine Clearance: 101.7 mL/min (by C-G formula based on SCr of 0.58 mg/dL (L)).   Microbiology: Recent Results (from the past 720 hour(s))  MRSA PCR Screening     Status: None   Collection Time: 05/26/16  9:57 AM  Result Value Ref Range Status   MRSA by PCR NEGATIVE NEGATIVE Final    Comment:        The GeneXpert MRSA Assay (FDA approved for NASAL specimens only), is one component of a comprehensive MRSA colonization surveillance program. It is not intended to diagnose MRSA infection nor to guide or monitor treatment for MRSA infections.     Pharmacy will continue to monitor and adjust per consult.    Willie Baker D 05/28/2016,10:20 AM

## 2016-05-28 NOTE — Progress Notes (Signed)
MEDICATION RELATED CONSULT NOTE   Pharmacy Consult for electrolyte management  Pharmacy consulted for electrolyte management for 56 yo male admitted to ICU. Patient currently being treated under the CIWA protocol. Patient currently receiving NS with 20 mEq @  1725mL/hr.    K= 3.1; Magnesium: 1.3   Plan:    Will give Magnesium 4 g IV x 1. Will recheck K and Magnesium @ 18:00. K may be falsely low due to hypomagensia.   2/11: K @ 18:00 = 3.3,  Mag = 1.9 Will order KCl 30 mEq IV X 1 ordered for 2/11 @ 19:00. Will recheck K on 2/12 with AM labs.   Allergies  Allergen Reactions  . Milk-Related Compounds     Patient Measurements: Height: 6\' 1"  (185.4 cm) Weight: 150 lb 2.1 oz (68.1 kg) IBW/kg (Calculated) : 79.9  Vital Signs: Temp: 100.9 F (38.3 C) (02/11 1300) Temp Source: Axillary (02/11 1300) BP: 126/73 (02/11 1700) Pulse Rate: 85 (02/11 1700) Intake/Output from previous day: 02/10 0701 - 02/11 0700 In: 3410.4 [P.O.:240; I.V.:3170.4] Out: 800 [Urine:800] Intake/Output from this shift: No intake/output data recorded.  Labs:  Recent Labs  05/26/16 1014 05/27/16 0555 05/28/16 0628 05/28/16 1811  CREATININE  --  0.42* 0.58*  --   MG 1.3* 1.8 1.3* 1.9  PHOS 2.6 3.5  --   --    Estimated Creatinine Clearance: 101.7 mL/min (by C-G formula based on SCr of 0.58 mg/dL (L)).   Microbiology: Recent Results (from the past 720 hour(s))  MRSA PCR Screening     Status: None   Collection Time: 05/26/16  9:57 AM  Result Value Ref Range Status   MRSA by PCR NEGATIVE NEGATIVE Final    Comment:        The GeneXpert MRSA Assay (FDA approved for NASAL specimens only), is one component of a comprehensive MRSA colonization surveillance program. It is not intended to diagnose MRSA infection nor to guide or monitor treatment for MRSA infections.     Pharmacy will continue to monitor and adjust per consult.    Reda Citron D 05/28/2016,6:40 PM

## 2016-05-28 NOTE — Plan of Care (Signed)
Problem: Physical Regulation: Goal: Complications related to the disease process, condition or treatment will be avoided or minimized Outcome: Progressing ETOH withdrawal sx being adequately managed w/prn IV ativan and cont precedex gtt per orders.  No seizures, VSS.  Problem: Safety: Goal: Ability to remain free from injury will improve Outcome: Progressing Remains safe this shift.  Procedures explained and pt compliant when awake.  No aggressive behavior at this time.  Pt compliant w/plan of care.

## 2016-05-29 LAB — BASIC METABOLIC PANEL
ANION GAP: 8 (ref 5–15)
BUN: <5 mg/dL — ABNORMAL LOW (ref 6–20)
CHLORIDE: 105 mmol/L (ref 101–111)
CO2: 21 mmol/L — ABNORMAL LOW (ref 22–32)
CREATININE: 0.39 mg/dL — AB (ref 0.61–1.24)
Calcium: 8 mg/dL — ABNORMAL LOW (ref 8.9–10.3)
GFR calc non Af Amer: >60 mL/min (ref >60)
Glucose, Bld: 100 mg/dL — ABNORMAL HIGH (ref 65–99)
POTASSIUM: 3.4 mmol/L — AB (ref 3.5–5.1)
Sodium: 134 mmol/L — ABNORMAL LOW (ref 135–145)

## 2016-05-29 MED ORDER — FOLIC ACID 5 MG/ML IJ SOLN
1.0000 mg | Freq: Every day | INTRAMUSCULAR | Status: DC
  Administered 2016-05-29 – 2016-06-06 (×9): 1 mg via INTRAVENOUS
  Filled 2016-05-29 (×9): qty 0.2

## 2016-05-29 MED ORDER — LABETALOL HCL 5 MG/ML IV SOLN
10.0000 mg | INTRAVENOUS | Status: DC | PRN
  Administered 2016-05-29 – 2016-06-01 (×13): 10 mg via INTRAVENOUS
  Filled 2016-05-29 (×14): qty 4

## 2016-05-29 MED ORDER — METOPROLOL TARTRATE 5 MG/5ML IV SOLN
5.0000 mg | Freq: Four times a day (QID) | INTRAVENOUS | Status: DC
  Administered 2016-05-29 – 2016-05-30 (×4): 5 mg via INTRAVENOUS
  Filled 2016-05-29 (×5): qty 5

## 2016-05-29 MED ORDER — PANTOPRAZOLE SODIUM 40 MG IV SOLR
40.0000 mg | INTRAVENOUS | Status: DC
  Administered 2016-05-29 – 2016-06-06 (×9): 40 mg via INTRAVENOUS
  Filled 2016-05-29 (×9): qty 40

## 2016-05-29 NOTE — Progress Notes (Signed)
[facility] - [place] at [facility]   PATIENT NAME: Willie Baker    MR#:  [id number]  DATE OF BIRTH:  04/24/1960  SUBJECTIVE:   Remains on Precedex gtt and when weaned yesterday pt. gets agitated and starts getting upset and uses foul language.  Continues to have Low grade fever  REVIEW OF SYSTEMS:   Review of Systems  Unable to perform ROS: Mental acuity  Constitutional: Positive for fever (low grade).   Tolerating Diet: NO  DRUG ALLERGIES:   Allergies  Allergen Reactions  . Milk-Related Compounds     VITALS:  Blood pressure 138/87, pulse (!) 105, temperature 99.1 F (37.3 C), temperature source Axillary, resp. rate (!) 33, height 6\' 1"  (1.854 m), weight 68.1 kg (150 lb 2.1 oz), SpO2 97 %.  PHYSICAL EXAMINATION:   Physical Exam  GENERAL:  56 y.o.-year-old patient lying in the bed lethargic/encephalopathic EYES: Pupils equal, round, reactive to light. No scleral icterus. Extraocular muscles intact.  HEENT: Head atraumatic, normocephalic. Oropharynx and nasopharynx clear. Dry Oral Mucosa NECK:  Supple, no jugular venous distention. No thyroid enlargement, no tenderness.  LUNGS: Normal breath sounds bilaterally, no wheezing, rales, rhonchi. No use of accessory muscles of respiration.  CARDIOVASCULAR: S1, S2 normal. No murmurs, rubs, or gallops. tachycardia ABDOMEN: Soft, nontender, nondistended. Bowel sounds present. No organomegaly or mass.  EXTREMITIES: No cyanosis, clubbing or edema b/l.    NEUROLOGIC: Cranial nerves II through XII are intact. No focal Motor or sensory deficits b/l.  Encephalopathic PSYCHIATRIC:  patient is alert and oriented x 1.  Encephalopathic.  SKIN: No obvious rash, lesion, or ulcer.   LABORATORY PANEL:  CBC  Recent Labs Lab 05/24/16 1902  WBC 5.9  HGB 13.5  HCT 39.4*  PLT 102*    Chemistries   Recent Labs Lab 05/24/16 1902  05/28/16 1811 05/29/16 0444  NA 128*  < >  --  134*  K 3.9  < > 3.3* 3.4*  CL  93*  < >  --  105  CO2 26  < >  --  21*  GLUCOSE 101*  < >  --  100*  BUN <5*  < >  --  <5*  CREATININE 0.67  < >  --  0.39*  CALCIUM 8.9  < >  --  8.0*  MG  --   < > 1.9  --   AST 110*  --   --   --   ALT 48  --   --   --   ALKPHOS 88  --   --   --   BILITOT 1.0  --   --   --   < > = values in this interval not displayed. Cardiac Enzymes No results for input(s): TROPONINI in the last 168 hours. RADIOLOGY:  No results found. ASSESSMENT AND PLAN:   56 yo male with no past medical history presented to the ED with abdominal pain and non-bloody emesis. Admitted for persistent tachycardia.  1. Tachycardia-this is sinus tachycardia due to alcohol withdrawal.  - HR improved and stable.   2. Alcohol abuse/withdrawal-Continue Precedex drip, IV Ativan, IV Haldol as needed. - very slow to improve.    3. Epigastric pain w/ intractable nausea and vomiting - likely due to gastritis secondary to alcohol abuse  - Continue IV Protonix  4. Hyponatremia -due to alcohol abuse -Continue IV fluids, sodium up to 134 today. Will monitor.   5. Hypokalemia - improving w/ supplementation and will monitor.  -  Mg. Level was 1.9 and improved w/ supplementation.   6. Fever - pt. Has low grade fever of 100.9.  Source unclear. ?? Atelectasis.  - if continues to spike will initiate fever work up with CXR, UA and follow fever curve.  Hold off on abx. For now.   Discussed plan of care with wife at bedside.   Case discussed with Care Management/Social Worker. Management plans discussed with the patient, family and they are in agreement.  CODE STATUS: full  DVT Prophylaxis: Ted's & SCD's  TOTAL TIME TAKING CARE OF THIS PATIENT: 25 minutes.    Note: This dictation was prepared with Dragon dictation along with smaller phrase technology. Any transcriptional errors that result from this process are unintentional.  Houston Siren M.D on 05/29/2016 at 4:12 PM  Between 7am to 6pm - Pager -  [phone]  After 6pm go to [email or web address] - Social research officer, government  [facility]  Office  [phone]  CC: Primary care physician; No PCP Per Patient

## 2016-05-29 NOTE — Progress Notes (Signed)
MEDICATION RELATED CONSULT NOTE - INITIAL   Pharmacy Consult for Electrolyte Monitoring and Replacement   Allergies  Allergen Reactions  . Milk-Related Compounds     Patient Measurements: Height: 6\' 1"  (185.4 cm) Weight: 150 lb 2.1 oz (68.1 kg) IBW/kg (Calculated) : 79.9  Vital Signs: Temp: 99.9 F (37.7 C) (02/12 1000) Temp Source: Axillary (02/12 1000) BP: 156/88 (02/12 1300) Pulse Rate: 98 (02/12 1300) Intake/Output from previous day: 02/11 0701 - 02/12 0700 In: 3678.4 [I.V.:3413.4; IV Piggyback:265] Out: 75 [Urine:75] Intake/Output from this shift: Total I/O In: 100.1 [I.V.:100.1] Out: -   Labs:  Recent Labs  05/27/16 0555 05/28/16 0628 05/28/16 1811 05/29/16 0444  CREATININE 0.42* 0.58*  --  0.39*  MG 1.8 1.3* 1.9  --   PHOS 3.5  --   --   --    Estimated Creatinine Clearance: 101.7 mL/min (by C-G formula based on SCr of 0.39 mg/dL (L)).   Microbiology: Recent Results (from the past 720 hour(s))  MRSA PCR Screening     Status: None   Collection Time: 05/26/16  9:57 AM  Result Value Ref Range Status   MRSA by PCR NEGATIVE NEGATIVE Final    Comment:        The GeneXpert MRSA Assay (FDA approved for NASAL specimens only), is one component of a comprehensive MRSA colonization surveillance program. It is not intended to diagnose MRSA infection nor to guide or monitor treatment for MRSA infections.     Medical History: History reviewed. No pertinent past medical history.  Assessment: 56 y/o M with a h/o EtOH abuse admitted with abdominal pain and tachycardia.   Plan:  K is slightly low but patient is receiving IVF providing 60 meq K/day. Will f/u AM labs.   Luisa Harthristy, Jeniel Slauson D 05/29/2016,1:58 PM

## 2016-05-30 LAB — BASIC METABOLIC PANEL
Anion gap: 9 (ref 5–15)
BUN: 7 mg/dL (ref 6–20)
CALCIUM: 8.5 mg/dL — AB (ref 8.9–10.3)
CO2: 19 mmol/L — AB (ref 22–32)
Chloride: 103 mmol/L (ref 101–111)
Creatinine, Ser: 0.53 mg/dL — ABNORMAL LOW (ref 0.61–1.24)
Glucose, Bld: 111 mg/dL — ABNORMAL HIGH (ref 65–99)
POTASSIUM: 4.1 mmol/L (ref 3.5–5.1)
Sodium: 131 mmol/L — ABNORMAL LOW (ref 135–145)

## 2016-05-30 LAB — MAGNESIUM: Magnesium: 1.4 mg/dL — ABNORMAL LOW (ref 1.7–2.4)

## 2016-05-30 MED ORDER — MAGNESIUM SULFATE 4 GM/100ML IV SOLN
4.0000 g | Freq: Once | INTRAVENOUS | Status: AC
  Administered 2016-05-30: 4 g via INTRAVENOUS
  Filled 2016-05-30: qty 100

## 2016-05-30 NOTE — Progress Notes (Signed)
[facility] -  at [facility]   PATIENT NAME: Willie Baker    MR#:  [id number]  DATE OF BIRTH:  08/24/1960  SUBJECTIVE:   No fever overnight.  Remains on Precedex gtt.  Wife at bedside.  As per nursing staff pt. Was a bit more cooperative today when weaned off the Precedex gtt.   REVIEW OF SYSTEMS:   Review of Systems  Unable to perform ROS: Mental acuity  Constitutional: Negative for fever.   Tolerating Diet: No  DRUG ALLERGIES:   Allergies  Allergen Reactions  . Milk-Related Compounds     VITALS:  Blood pressure 106/67, pulse 94, temperature 98.4 F (36.9 C), temperature source Axillary, resp. rate (!) 26, height 6\' 1"  (1.854 m), weight 68.5 kg (151 lb 0.2 oz), SpO2 97 %.  PHYSICAL EXAMINATION:   Physical Exam  GENERAL:  56 y.o.-year-old patient lying in the bed lethargic/encephalopathic EYES: Pupils equal, round, reactive to light. No scleral icterus.   HEENT: Head atraumatic, normocephalic. Oropharynx and nasopharynx clear. Dry Oral Mucosa NECK:  Supple, no jugular venous distention. No thyroid enlargement, no tenderness.  LUNGS: Normal breath sounds bilaterally, no wheezing, rales, rhonchi. No use of accessory muscles of respiration.  CARDIOVASCULAR: S1, S2 normal. No murmurs, rubs, or gallops. tachycardia ABDOMEN: Soft, nontender, nondistended. Bowel sounds present. No organomegaly or mass.  EXTREMITIES: No cyanosis, clubbing or edema b/l.    NEUROLOGIC: Cranial nerves II through XII are intact. No focal Motor or sensory deficits b/l.  Encephalopathic PSYCHIATRIC:  patient is alert and oriented x 1.  Encephalopathic.  SKIN: No obvious rash, lesion, or ulcer.   LABORATORY PANEL:  CBC  Recent Labs Lab 05/24/16 1902  WBC 5.9  HGB 13.5  HCT 39.4*  PLT 102*    Chemistries   Recent Labs Lab 05/24/16 1902  05/30/16 0749  NA 128*  < > 131*  K 3.9  < > 4.1  CL 93*  < > 103  CO2 26  < > 19*  GLUCOSE 101*  < > 111*  BUN <5*   < > 7  CREATININE 0.67  < > 0.53*  CALCIUM 8.9  < > 8.5*  MG  --   < > 1.4*  AST 110*  --   --   ALT 48  --   --   ALKPHOS 88  --   --   BILITOT 1.0  --   --   < > = values in this interval not displayed. Cardiac Enzymes No results for input(s): TROPONINI in the last 168 hours. RADIOLOGY:  No results found. ASSESSMENT AND PLAN:   56 yo male with no past medical history presented to the ED with abdominal pain and non-bloody emesis. Admitted for persistent tachycardia.  1. Tachycardia-this is sinus tachycardia due to alcohol withdrawal.  - HR improved and stable.   2. Alcohol abuse/withdrawal-Continue Precedex drip, IV Ativan, IV Haldol as needed. - very slow to improve.    3. Epigastric pain w/ intractable nausea and vomiting - likely due to gastritis secondary to alcohol abuse  - Continue IV Protonix  4. Hyponatremia -due to alcohol abuse -Continue IV fluids, sodium level stable and will monitor.    5. Hypokalemia - improving w/ supplementation and will monitor.  - Mg. Level was 1.4 and cont. Supplementation and will repeat level in a.m.    6. Fever - now resolved.  Follow fever curve.   7. Nutrition - if encephalopathy not improving in the next 24 hrs  will place Dobhoff and start enteral nutrition.   Discussed plan of care with wife at bedside.   Case discussed with Care Management/Social Worker. Management plans discussed with the patient, family and they are in agreement.  CODE STATUS: full  DVT Prophylaxis: Ted's & SCD's  TOTAL TIME TAKING CARE OF THIS PATIENT: 25 minutes.    Note: This dictation was prepared with Dragon dictation along with smaller phrase technology. Any transcriptional errors that result from this process are unintentional.  Houston SirenSAINANI,VIVEK J M.D on 05/30/2016 at 3:58 PM  Between 7am to 6pm - Pager - [phone]  After 6pm go to [email or web address] - Social research officer, governmentpassword EPAS [facility]  [facility]  Office  [phone]  CC: Primary care  physician; No PCP Per Patient

## 2016-05-30 NOTE — Progress Notes (Signed)
Nutrition Follow-up  DOCUMENTATION CODES:   Non-severe (moderate) malnutrition in context of chronic illness  INTERVENTION:  1. Discussed with MD Sainani; Recommend place Dobhoff tube, begin Vital 1.5 @ 3320mL/hr, if tolerating increase by 10 every 8 hours to goal rate of 8455mL/hr 150cc free water q4h At goal provides 1980 calories, 89gm protein, 1903cc free water 2. Recommend monitor Mg, Phos, K for at least 3 days, replete as necessary, patient is at risk for refeeding.  NUTRITION DIAGNOSIS:   Malnutrition (Moderate) related to chronic illness, poor appetite (abdominal pain, NV) as evidenced by energy intake < 75% for > or equal to 1 month, moderate depletions of muscle mass, moderate depletion of body fat. -ongoing  GOAL:   Patient will meet greater than or equal to 90% of their needs -not meeting  MONITOR:   PO intake, Supplement acceptance, Labs, I & O's, Weight trends  REASON FOR ASSESSMENT:   Malnutrition Screening Tool    ASSESSMENT:   56 year old male with no PMHx presented with abdominal pain and non-bloody emesis admitted for persistent tachycardia.   Patient with alcohol withdrawal, agitated, and aggressive. Today, was lethargic - receiving IV Halodol & Ativan Consumed nothing for 5 days, was malnourished upon admission. Labs and medications reviewed: Na 131, Mg 1.4 Colace, MVI w/ Minerals, Thiamine B1 NS w/ KCL @ 11825mL/hr Precedex gtt  Diet Order:  Diet Heart Room service appropriate? Yes; Fluid consistency: Thin  Skin:  Reviewed, no issues  Last BM:  05/25/2016  Height:   Ht Readings from Last 1 Encounters:  05/26/16 6\' 1"  (1.854 m)    Weight:   Wt Readings from Last 1 Encounters:  05/30/16 151 lb 0.2 oz (68.5 kg)    Ideal Body Weight:  83.6 kg  BMI:  Body mass index is 19.92 kg/m.  Estimated Nutritional Needs:   Kcal:  1900-2065 (MSJ x 1.2-1.3)  Protein:  70-83 grams (1-1.2 grams/kg)  Fluid:  2 L/day (30 ml/kg)  EDUCATION NEEDS:    No education needs identified at this time  Willie AnoWilliam M. Jesseca Marsch, MS, RD LDN Inpatient Clinical Dietitian Pager [phone]

## 2016-05-30 NOTE — Progress Notes (Signed)
MEDICATION RELATED CONSULT NOTE - INITIAL   Pharmacy Consult for Electrolyte Monitoring and Replacement   Allergies  Allergen Reactions  . Milk-Related Compounds     Patient Measurements: Height: 6\' 1"  (185.4 cm) Weight: 151 lb 0.2 oz (68.5 kg) IBW/kg (Calculated) : 79.9  Vital Signs: Temp: 97.4 F (36.3 C) (02/13 0800) Temp Source: Axillary (02/13 0800) BP: 99/71 (02/13 1100) Pulse Rate: 84 (02/13 1100) Intake/Output from previous day: 02/12 0701 - 02/13 0700 In: 3189.7 [I.V.:3139.5; IV Piggyback:50.2] Out: -  Intake/Output from this shift: Total I/O In: 1056.7 [I.V.:1056.7] Out: -   Labs:  Recent Labs  05/28/16 0628 05/28/16 1811 05/29/16 0444 05/30/16 0749  CREATININE 0.58*  --  0.39* 0.53*  MG 1.3* 1.9  --  1.4*   Estimated Creatinine Clearance: 102.3 mL/min (by C-G formula based on SCr of 0.53 mg/dL (L)).   Microbiology: Recent Results (from the past 720 hour(s))  MRSA PCR Screening     Status: None   Collection Time: 05/26/16  9:57 AM  Result Value Ref Range Status   MRSA by PCR NEGATIVE NEGATIVE Final    Comment:        The GeneXpert MRSA Assay (FDA approved for NASAL specimens only), is one component of a comprehensive MRSA colonization surveillance program. It is not intended to diagnose MRSA infection nor to guide or monitor treatment for MRSA infections.     Medical History: History reviewed. No pertinent past medical history.  Assessment: 56 y/o M with a h/o EtOH abuse admitted with abdominal pain and tachycardia.   Plan:  Magnesium sulfate 4 g iv once and f/u AM labs.   Luisa HartChristy, Arleth Mccullar D 05/30/2016,11:41 AM

## 2016-05-31 ENCOUNTER — Inpatient Hospital Stay: Payer: Medicaid Other

## 2016-05-31 LAB — MAGNESIUM: Magnesium: 1.8 mg/dL (ref 1.7–2.4)

## 2016-05-31 LAB — BASIC METABOLIC PANEL
ANION GAP: 10 (ref 5–15)
BUN: 8 mg/dL (ref 6–20)
CHLORIDE: 107 mmol/L (ref 101–111)
CO2: 18 mmol/L — AB (ref 22–32)
Calcium: 8.5 mg/dL — ABNORMAL LOW (ref 8.9–10.3)
Creatinine, Ser: 0.56 mg/dL — ABNORMAL LOW (ref 0.61–1.24)
GFR calc Af Amer: >60 mL/min (ref >60)
GFR calc non Af Amer: >60 mL/min (ref >60)
GLUCOSE: 97 mg/dL (ref 65–99)
POTASSIUM: 4.6 mmol/L (ref 3.5–5.1)
Sodium: 135 mmol/L (ref 135–145)

## 2016-05-31 MED ORDER — BISACODYL 10 MG RE SUPP
10.0000 mg | Freq: Once | RECTAL | Status: AC
  Administered 2016-05-31: 10 mg via RECTAL
  Filled 2016-05-31: qty 1

## 2016-05-31 MED ORDER — METOPROLOL TARTRATE 5 MG/5ML IV SOLN
5.0000 mg | Freq: Four times a day (QID) | INTRAVENOUS | Status: DC | PRN
  Administered 2016-05-31 – 2016-06-07 (×18): 5 mg via INTRAVENOUS
  Administered 2016-06-13: 4 mg via INTRAVENOUS
  Filled 2016-05-31 (×25): qty 5

## 2016-05-31 NOTE — Care Management Note (Signed)
Case Management Note  Patient Details  Name: Willie Baker MRN: [id number] Date of Birth: 01/07/1961  Subjective/Objective:                  Spoke with patient significant other regarding discharge planning. Patient is self-pay; No income last 6 months.His PCP is with [facility] in [place]; [facility].  [place]. No home O2. He was able to drive; independent at home.   Action/Plan: Applications to Medications Management and [facility] left at bedside along with my contact card. She has been made aware that patient is supposed to transfer to floor today. RNCM to continue to follow for medication needs.  Expected Discharge Date:                  Expected Discharge Plan:     In-House Referral:     Discharge planning Services  CM Consult  Post Acute Care Choice:    Choice offered to:  Spouse  DME Arranged:    DME Agency:     HH Arranged:    HH Agency:     Status of Service:     If discussed at [employer] Length of Stay Meetings, dates discussed:    Additional Comments:  Collie Siadngela Wren Pryce, RN 05/31/2016, 11:15 AM

## 2016-05-31 NOTE — Progress Notes (Signed)
Pt. Alert but disoriented to time, place, and situation. Still unable to safely take po meds. Precedex remains off. Pt confused but no longer combative and has remained in bed. HR in 115-125 and blood pressure systolic in the 145-165. Being treated with lopressor and labetalol prn. Abdomen is slightly distended, no BM since 2/11. Abdominal x-ray ordered showing small bowel dilation. Dophoff placement has been suspended and pt received ducolax suppository @ 1800.

## 2016-05-31 NOTE — Progress Notes (Signed)
[facility] - [place] at [facility]   PATIENT NAME: Willie Baker    MR#:  [id number]  DATE OF BIRTH:  September 18, 1960  SUBJECTIVE:   A bit awake today but not following commands.  Off Precedex gtt now.  Remains on CIWA.    REVIEW OF SYSTEMS:   Review of Systems  Unable to perform ROS: Mental acuity  Constitutional: Negative for fever.   Tolerating Diet: No  DRUG ALLERGIES:   Allergies  Allergen Reactions  . Milk-Related Compounds     VITALS:  Blood pressure (!) 161/97, pulse (!) 122, temperature 99.1 F (37.3 C), temperature source Oral, resp. rate (!) 28, height 6\' 1"  (1.854 m), weight 70.7 kg (155 lb 13.8 oz), SpO2 95 %.  PHYSICAL EXAMINATION:   Physical Exam  GENERAL:  56 y.o.-year-old patient lying in the bed lethargic/encephalopathic EYES: Pupils equal, round, reactive to light. No scleral icterus.   HEENT: Head atraumatic, normocephalic. Oropharynx and nasopharynx clear. Dry Oral Mucosa NECK:  Supple, no jugular venous distention. No thyroid enlargement, no tenderness.  LUNGS: Normal breath sounds bilaterally, no wheezing, rales, upper airway rhonchi. No use of accessory muscles of respiration.  CARDIOVASCULAR: S1, S2 normal. No murmurs, rubs, or gallops. tachycardia ABDOMEN: Soft, nontender, slightly distended. Bowel sounds present. No organomegaly or mass.  EXTREMITIES: No cyanosis, clubbing or edema b/l.    NEUROLOGIC: Cranial nerves II through XII are intact. No focal Motor or sensory deficits b/l.  Encephalopathic PSYCHIATRIC:  patient is alert and oriented x 1.  Encephalopathic.  SKIN: No obvious rash, lesion, or ulcer.   LABORATORY PANEL:  CBC  Recent Labs Lab 05/24/16 1902  WBC 5.9  HGB 13.5  HCT 39.4*  PLT 102*    Chemistries   Recent Labs Lab 05/24/16 1902  05/31/16 0529  NA 128*  < > 135  K 3.9  < > 4.6  CL 93*  < > 107  CO2 26  < > 18*  GLUCOSE 101*  < > 97  BUN <5*  < > 8  CREATININE 0.67  < > 0.56*  CALCIUM  8.9  < > 8.5*  MG  --   < > 1.8  AST 110*  --   --   ALT 48  --   --   ALKPHOS 88  --   --   BILITOT 1.0  --   --   < > = values in this interval not displayed. Cardiac Enzymes No results for input(s): TROPONINI in the last 168 hours. RADIOLOGY:  Dg Abd 1 View  Result Date: 05/31/2016 CLINICAL DATA:  Previous history of ileus EXAM: ABDOMEN - 1 VIEW COMPARISON:  None. FINDINGS: Scattered small bowel and large bowel gas is noted. Multiple dilated loops of small bowel are noted in the left mid abdomen. Prominence of the cecum is noted to just over 10 cm. No definitive obstructive changes are seen. No free air is seen. IMPRESSION: Mild dilatation of the small bowel and cecum likely related to a generalized ileus. No free air is noted. Electronically Signed   By: Alcide Clever M.D.   On: 05/31/2016 13:06   Dg Chest Port 1 View  Result Date: 05/31/2016 CLINICAL DATA:  Cough, smoker EXAM: PORTABLE CHEST 1 VIEW COMPARISON:  None. FINDINGS: The heart size and mediastinal contours are within normal limits. No pulmonary consolidation or peribronchial thickening. There is minimal atelectasis at the left lung base. No CHF, effusion or pneumothorax. The visualized skeletal structures are unremarkable. IMPRESSION: No  active disease. Electronically Signed   By: Tollie Ethavid  Kwon M.D.   On: 05/31/2016 01:08   ASSESSMENT AND PLAN:   56 yo male with no past medical history presented to the ED with abdominal pain and non-bloody emesis. Admitted for persistent tachycardia.  1. Tachycardia-this is sinus tachycardia due to alcohol withdrawal.  - HR improved and stable.   2. Alcohol abuse/withdrawal- off Precedex gtt now and will cont. CIWA.  - very slow to improve.    3. Epigastric pain w/ intractable nausea and vomiting - likely due to gastritis secondary to alcohol abuse  - Continue IV Protonix  4. Hyponatremia -due to alcohol abuse - resolved w/ IV fluids.    5. Hypokalemia/Hypomagnesemia - improved w/  supplementation and will cont. To monitor.  6. Fever - now resolved.  Follow fever curve.   7. Nutrition - if mental status not improving will start dobhoff feeding soon.    8. Abdominal distension - X-ray showing ileus.  No N/V.  Will given Dulcolax and follow clinically.   Discussed plan of care with wife at bedside.   Case discussed with Care Management/Social Worker. Management plans discussed with the patient, family and they are in agreement.  CODE STATUS: full  DVT Prophylaxis: Ted's & SCD's  TOTAL TIME TAKING CARE OF THIS PATIENT: 30 minutes.    Note: This dictation was prepared with Dragon dictation along with smaller phrase technology. Any transcriptional errors that result from this process are unintentional.  [place] SirenSAINANI,Josslyn Ciolek J M.D on 05/31/2016 at 4:07 PM  Between 7am to 6pm - Pager - [phone]  After 6pm go to [email or web address] - Social research officer, governmentpassword EPAS [facility]  [facility] [facility]  Office  [phone]  CC: Primary care physician; No PCP Per Patient

## 2016-05-31 NOTE — Progress Notes (Signed)
MEDICATION RELATED CONSULT NOTE - INITIAL   Pharmacy Consult for Electrolyte Monitoring and Replacement   Allergies  Allergen Reactions  . Milk-Related Compounds     Patient Measurements: Height: 6\' 1"  (185.4 cm) Weight: 155 lb 13.8 oz (70.7 kg) IBW/kg (Calculated) : 79.9  Vital Signs: Temp: 99.1 F (37.3 C) (02/14 0800) Temp Source: Oral (02/14 0800) BP: 159/91 (02/14 1200) Pulse Rate: 118 (02/14 1200) Intake/Output from previous day: 02/13 0701 - 02/14 0700 In: 3393.2 [I.V.:3393.2] Out: -  Intake/Output from this shift: Total I/O In: 550 [I.V.:500; IV Piggyback:50] Out: -   Labs:  Recent Labs  05/28/16 1811 05/29/16 0444 05/30/16 0749 05/31/16 0529  CREATININE  --  0.39* 0.53* 0.56*  MG 1.9  --  1.4* 1.8   Estimated Creatinine Clearance: 105.6 mL/min (by C-G formula based on SCr of 0.56 mg/dL (L)).   Microbiology: Recent Results (from the past 720 hour(s))  MRSA PCR Screening     Status: None   Collection Time: 05/26/16  9:57 AM  Result Value Ref Range Status   MRSA by PCR NEGATIVE NEGATIVE Final    Comment:        The GeneXpert MRSA Assay (FDA approved for NASAL specimens only), is one component of a comprehensive MRSA colonization surveillance program. It is not intended to diagnose MRSA infection nor to guide or monitor treatment for MRSA infections.     Medical History: History reviewed. No pertinent past medical history.  Assessment: 56 y/o M with a h/o EtOH abuse admitted with abdominal pain and tachycardia.   Plan:  Electrolytes are WNL. Will hold off on checking another BMET for 48 hours.   Luisa Harthristy, Aubrielle Stroud D 05/31/2016,12:18 PM

## 2016-05-31 NOTE — Progress Notes (Signed)
Nutrition Follow-up  DOCUMENTATION CODES:   Non-severe (moderate) malnutrition in context of chronic illness  INTERVENTION:  1. Per RN, suspected patient has ileus, CT today 2. If appropriate, follow previous tubefeeding recommendations, otherwise, monitor For diet advancement per MD  NUTRITION DIAGNOSIS:   Malnutrition (Moderate) related to chronic illness, poor appetite (abdominal pain, NV) as evidenced by energy intake < 75% for > or equal to 1 month, moderate depletions of muscle mass, moderate depletion of body fat. -ongoing GOAL:   Patient will meet greater than or equal to 90% of their needs -not meeting MONITOR:   PO intake, Supplement acceptance, Labs, I & O's, Weight trends  REASON FOR ASSESSMENT:   Malnutrition Screening Tool    ASSESSMENT:   56 year old male with no PMHx presented with abdominal pain and non-bloody emesis admitted for persistent tachycardia.   Patient remains agitated, requiring ativan No PO intake x  6 days Labs and medications reviewed: Colace, MVI w/ Minerals, Thiamine, Folic Acid NS w/ KCL @ 12825mL/hr   Diet Order:  Diet Heart Room service appropriate? Yes; Fluid consistency: Thin  Skin:  Reviewed, no issues  Last BM:  05/25/2016  Height:   Ht Readings from Last 1 Encounters:  05/26/16 6\' 1"  (1.854 m)    Weight:   Wt Readings from Last 1 Encounters:  05/31/16 155 lb 13.8 oz (70.7 kg)    Ideal Body Weight:  83.6 kg  BMI:  Body mass index is 20.56 kg/m.  Estimated Nutritional Needs:   Kcal:  1900-2065 (MSJ x 1.2-1.3)  Protein:  70-83 grams (1-1.2 grams/kg)  Fluid:  2 L/day (30 ml/kg)  EDUCATION NEEDS:   No education needs identified at this time  Dionne AnoWilliam M. Corene Resnick, MS, RD LDN Inpatient Clinical Dietitian Pager [phone]

## 2016-06-01 ENCOUNTER — Inpatient Hospital Stay: Payer: Medicaid Other

## 2016-06-01 MED ORDER — HYDRALAZINE HCL 20 MG/ML IJ SOLN
10.0000 mg | Freq: Once | INTRAMUSCULAR | Status: AC
  Administered 2016-06-01: 10 mg via INTRAVENOUS
  Filled 2016-06-01: qty 1

## 2016-06-01 MED ORDER — HYDRALAZINE HCL 20 MG/ML IJ SOLN
20.0000 mg | Freq: Once | INTRAMUSCULAR | Status: AC
  Administered 2016-06-01: 20 mg via INTRAVENOUS
  Filled 2016-06-01: qty 1

## 2016-06-01 MED ORDER — METOPROLOL TARTRATE 5 MG/5ML IV SOLN
5.0000 mg | Freq: Once | INTRAVENOUS | Status: AC
  Administered 2016-06-01: 5 mg via INTRAVENOUS
  Filled 2016-06-01: qty 5

## 2016-06-01 NOTE — Progress Notes (Signed)
MEDICATION RELATED CONSULT NOTE - INITIAL   Pharmacy Consult for Electrolyte Monitoring and Replacement   Allergies  Allergen Reactions  . Milk-Related Compounds     Patient Measurements: Height: 6\' 1"  (185.4 cm) Weight: 154 lb 5.2 oz (70 kg) IBW/kg (Calculated) : 79.9  Vital Signs: Temp: 99.5 F (37.5 C) (02/15 0800) Temp Source: Oral (02/15 0800) BP: 162/95 (02/15 1200) Pulse Rate: 114 (02/15 1200) Intake/Output from previous day: 02/14 0701 - 02/15 0700 In: 2800 [I.V.:2750; IV Piggyback:50] Out: -  Intake/Output from this shift: Total I/O In: 1050 [I.V.:1000; IV Piggyback:50] Out: -   Labs:  Recent Labs  05/30/16 0749 05/31/16 0529  CREATININE 0.53* 0.56*  MG 1.4* 1.8   Estimated Creatinine Clearance: 104.5 mL/min (by C-G formula based on SCr of 0.56 mg/dL (L)).   Microbiology: Recent Results (from the past 720 hour(s))  MRSA PCR Screening     Status: None   Collection Time: 05/26/16  9:57 AM  Result Value Ref Range Status   MRSA by PCR NEGATIVE NEGATIVE Final    Comment:        The GeneXpert MRSA Assay (FDA approved for NASAL specimens only), is one component of a comprehensive MRSA colonization surveillance program. It is not intended to diagnose MRSA infection nor to guide or monitor treatment for MRSA infections.     Medical History: History reviewed. No pertinent past medical history.  Assessment: 56 y/o M with a h/o EtOH abuse admitted with abdominal pain and tachycardia.   Plan:  Will recheck electrolytes 2/16.  Luisa Harthristy, Lenora Gomes D 06/01/2016,1:58 PM

## 2016-06-01 NOTE — Progress Notes (Signed)
Patient alert and disoriented to time, place and situation. BP and HR elevated throughout shift despite administration of medications. Currently BP is 141/90 and HR 119. This nurse spoke with hospitalists more than once during the night about HR and BP and received new orders with some improvement noted. Patient has not put forth effort in accepting PO intake or medications and is felt to be unsafe to do so by this nurse therefore PO medications and nourishment held. No significant BM this shift, only smear noted. CIWA  Scores 4 and 5 for the night.

## 2016-06-01 NOTE — Progress Notes (Signed)
[facility] - [place] at [facility]   PATIENT NAME: Willie Baker    MR#:  [id number]  DATE OF BIRTH:  01-11-1961  SUBJECTIVE:   Remains off predex gtt but still not much awake.  Cont. CIWA.  No other acute events overnight.  Abd X-ray showing ileus and minimal response to Dulcolax suppository.   REVIEW OF SYSTEMS:   Review of Systems  Unable to perform ROS: Mental acuity  Constitutional: Negative for fever.   Tolerating Diet: No  DRUG ALLERGIES:   Allergies  Allergen Reactions  . Milk-Related Compounds     VITALS:  Blood pressure (!) 162/95, pulse (!) 114, temperature 99.5 F (37.5 C), temperature source Oral, resp. rate 18, height 6\' 1"  (1.854 m), weight 70 kg (154 lb 5.2 oz), SpO2 95 %.  PHYSICAL EXAMINATION:   Physical Exam  GENERAL:  56 y.o.-year-old patient lying in  bed lethargic/encephalopathic EYES: Pupils equal, round, reactive to light. No scleral icterus.   HEENT: Head atraumatic, normocephalic. Oropharynx and nasopharynx clear. Dry Oral Mucosa NECK:  Supple, no jugular venous distention. No thyroid enlargement, no tenderness.  LUNGS: Normal breath sounds bilaterally, no wheezing, rales, upper airway rhonchi. No use of accessory muscles of respiration.  CARDIOVASCULAR: S1, S2 normal. No murmurs, rubs, or gallops. tachycardia ABDOMEN: Soft, nontender, slightly distended. Hypoactive BS. No organomegaly or mass.  EXTREMITIES: No cyanosis, clubbing or edema b/l.    NEUROLOGIC: Cranial nerves II through XII are intact. No focal Motor or sensory deficits b/l.  Encephalopathic PSYCHIATRIC:  patient is alert and oriented x 1.  Encephalopathic.  SKIN: No obvious rash, lesion, or ulcer.   LABORATORY PANEL:  CBC No results for input(s): WBC, HGB, HCT, PLT in the last 168 hours.  Chemistries   Recent Labs Lab 05/31/16 0529  NA 135  K 4.6  CL 107  CO2 18*  GLUCOSE 97  BUN 8  CREATININE 0.56*  CALCIUM 8.5*  MG 1.8   Cardiac  Enzymes No results for input(s): TROPONINI in the last 168 hours. RADIOLOGY:  Dg Abd 1 View  Result Date: 05/31/2016 CLINICAL DATA:  Previous history of ileus EXAM: ABDOMEN - 1 VIEW COMPARISON:  None. FINDINGS: Scattered small bowel and large bowel gas is noted. Multiple dilated loops of small bowel are noted in the left mid abdomen. Prominence of the cecum is noted to just over 10 cm. No definitive obstructive changes are seen. No free air is seen. IMPRESSION: Mild dilatation of the small bowel and cecum likely related to a generalized ileus. No free air is noted. Electronically Signed   By: Alcide Clever M.D.   On: 05/31/2016 13:06   Dg Chest Port 1 View  Result Date: 05/31/2016 CLINICAL DATA:  Cough, smoker EXAM: PORTABLE CHEST 1 VIEW COMPARISON:  None. FINDINGS: The heart size and mediastinal contours are within normal limits. No pulmonary consolidation or peribronchial thickening. There is minimal atelectasis at the left lung base. No CHF, effusion or pneumothorax. The visualized skeletal structures are unremarkable. IMPRESSION: No active disease. Electronically Signed   By: Tollie Eth M.D.   On: 05/31/2016 01:08   ASSESSMENT AND PLAN:   56 yo male with no past medical history presented to the ED with abdominal pain and non-bloody emesis. Admitted for persistent tachycardia.  1. Tachycardia-this is sinus tachycardia due to alcohol withdrawal.  - HR improved and stable.  No acute issue  2. Abdominal distension/ileus - X-ray yesterday showing ileus.  Minimal response to Dulcolax.   - placed  NG tube to low intermittent suction.  Will get X-ray in a.m and if improving consider starting on trickle tube feeds.  3. Alcohol abuse/withdrawal- off Precedex gtt now and will cont. CIWA.  - very slow to improve.    4. Epigastric pain w/ intractable nausea and vomiting - likely due to gastritis secondary to alcohol abuse  - Continue IV Protonix  5. Hyponatremia -due to alcohol abuse - resolved  w/ IV fluids.    6. Hypokalemia/Hypomagnesemia - improved w/ supplementation and will cont. To monitor.  7. Fever - now resolved.  Follow fever curve.   8. Nutrition - if ileus improving in a.m. And abdomen less distended will start Trickle tube feeds.    Case discussed with Care Management/Social Worker. Management plans discussed with the patient, family and they are in agreement.  CODE STATUS: full  DVT Prophylaxis: Ted's & SCD's  TOTAL TIME TAKING CARE OF THIS PATIENT: 25 minutes.    Note: This dictation was prepared with Dragon dictation along with smaller phrase technology. Any transcriptional errors that result from this process are unintentional.  [place] SirenSAINANI,VIVEK J M.D on 06/01/2016 at 2:39 PM  Between 7am to 6pm - Pager - [phone]  After 6pm go to [email or web address] - Social research officer, governmentpassword EPAS [facility]  [facility]  Office  [phone]  CC: Primary care physician; No PCP Per Patient

## 2016-06-01 NOTE — Progress Notes (Signed)
Nutrition Follow-up  DOCUMENTATION CODES:   Non-severe (moderate) malnutrition in context of chronic illness  INTERVENTION:  Patient currently with NGT to LIWS - 100cc light brown output during my visit. Monitor for needs  NUTRITION DIAGNOSIS:   Malnutrition (Moderate) related to chronic illness, poor appetite (abdominal pain, NV) as evidenced by energy intake < 75% for > or equal to 1 month, moderate depletions of muscle mass, moderate depletion of body fat. -ongoing  GOAL:   Patient will meet greater than or equal to 90% of their needs -not meeting  MONITOR:   PO intake, Supplement acceptance, Labs, I & O's, Weight trends  REASON FOR ASSESSMENT:   Malnutrition Screening Tool    ASSESSMENT:   56 year old male with no PMHx presented with abdominal pain and non-bloody emesis admitted for persistent tachycardia.   Lethargic, unable to respond No PO x7 days NGT to suction Foul smell in room - had medium liquid stool early this morning per NT No urine output documented Labs and medications reviewed: Colace, Banana Bag NS w/ KCL @ 16425mL/hr  Diet Order:  Diet Heart Room service appropriate? Yes; Fluid consistency: Thin  Skin:  Reviewed, no issues  Last BM:  05/25/2016  Height:   Ht Readings from Last 1 Encounters:  05/26/16 6\' 1"  (1.854 m)    Weight:   Wt Readings from Last 1 Encounters:  06/01/16 154 lb 5.2 oz (70 kg)    Ideal Body Weight:  83.6 kg  BMI:  Body mass index is 20.36 kg/m.  Estimated Nutritional Needs:   Kcal:  1900-2065 (MSJ x 1.2-1.3)  Protein:  70-83 grams (1-1.2 grams/kg)  Fluid:  2 L/day (30 ml/kg)  EDUCATION NEEDS:   No education needs identified at this time  Dionne AnoWilliam M. Ashleynicole Mcclees, MS, RD LDN Inpatient Clinical Dietitian Pager [phone]

## 2016-06-02 ENCOUNTER — Inpatient Hospital Stay: Payer: Medicaid Other

## 2016-06-02 ENCOUNTER — Encounter: Payer: Self-pay | Admitting: Radiology

## 2016-06-02 DIAGNOSIS — K567 Ileus, unspecified: Secondary | ICD-10-CM

## 2016-06-02 LAB — CBC
HCT: 38.4 % — ABNORMAL LOW (ref 40.0–52.0)
HEMOGLOBIN: 12.9 g/dL — AB (ref 13.0–18.0)
MCH: 32.6 pg (ref 26.0–34.0)
MCHC: 33.6 g/dL (ref 32.0–36.0)
MCV: 96.8 fL (ref 80.0–100.0)
Platelets: 177 10*3/uL (ref 150–440)
RBC: 3.97 MIL/uL — ABNORMAL LOW (ref 4.40–5.90)
RDW: 13.4 % (ref 11.5–14.5)
WBC: 5.9 10*3/uL (ref 3.8–10.6)

## 2016-06-02 LAB — BLOOD GAS, ARTERIAL
Acid-Base Excess: 1.2 mmol/L (ref 0.0–2.0)
Bicarbonate: 25 mmol/L (ref 20.0–28.0)
FIO2: 0.21
O2 SAT: 88.1 %
PATIENT TEMPERATURE: 37
pCO2 arterial: 36 mmHg (ref 32.0–48.0)
pH, Arterial: 7.45 (ref 7.350–7.450)
pO2, Arterial: 52 mmHg — ABNORMAL LOW (ref 83.0–108.0)

## 2016-06-02 LAB — BASIC METABOLIC PANEL
Anion gap: 7 (ref 5–15)
BUN: 6 mg/dL (ref 6–20)
CHLORIDE: 104 mmol/L (ref 101–111)
CO2: 23 mmol/L (ref 22–32)
CREATININE: 0.45 mg/dL — AB (ref 0.61–1.24)
Calcium: 8.2 mg/dL — ABNORMAL LOW (ref 8.9–10.3)
GFR calc non Af Amer: >60 mL/min (ref >60)
Glucose, Bld: 103 mg/dL — ABNORMAL HIGH (ref 65–99)
POTASSIUM: 3.4 mmol/L — AB (ref 3.5–5.1)
SODIUM: 134 mmol/L — AB (ref 135–145)

## 2016-06-02 LAB — GLUCOSE, CAPILLARY
Glucose-Capillary: 100 mg/dL — ABNORMAL HIGH (ref 65–99)
Glucose-Capillary: 107 mg/dL — ABNORMAL HIGH (ref 65–99)
Glucose-Capillary: 99 mg/dL (ref 65–99)

## 2016-06-02 LAB — HEPATIC FUNCTION PANEL
ALBUMIN: 2.7 g/dL — AB (ref 3.5–5.0)
ALT: 22 U/L (ref 17–63)
AST: 32 U/L (ref 15–41)
Alkaline Phosphatase: 54 U/L (ref 38–126)
Bilirubin, Direct: 0.7 mg/dL — ABNORMAL HIGH (ref 0.1–0.5)
Indirect Bilirubin: 0.9 mg/dL (ref 0.3–0.9)
TOTAL PROTEIN: 6.6 g/dL (ref 6.5–8.1)
Total Bilirubin: 1.6 mg/dL — ABNORMAL HIGH (ref 0.3–1.2)

## 2016-06-02 LAB — PHOSPHORUS: PHOSPHORUS: 2.6 mg/dL (ref 2.5–4.6)

## 2016-06-02 LAB — TRIGLYCERIDES: TRIGLYCERIDES: 88 mg/dL (ref <150)

## 2016-06-02 LAB — MAGNESIUM: MAGNESIUM: 1.4 mg/dL — AB (ref 1.7–2.4)

## 2016-06-02 MED ORDER — TRACE MINERALS CR-CU-MN-SE-ZN 10-1000-500-60 MCG/ML IV SOLN
INTRAVENOUS | Status: AC
  Administered 2016-06-02: 20:00:00 via INTRAVENOUS
  Filled 2016-06-02: qty 720

## 2016-06-02 MED ORDER — IOPAMIDOL (ISOVUE-300) INJECTION 61%
100.0000 mL | Freq: Once | INTRAVENOUS | Status: AC | PRN
  Administered 2016-06-02: 100 mL via INTRAVENOUS

## 2016-06-02 MED ORDER — MAGNESIUM SULFATE 4 GM/100ML IV SOLN
4.0000 g | Freq: Once | INTRAVENOUS | Status: AC
  Administered 2016-06-02: 09:00:00 4 g via INTRAVENOUS
  Filled 2016-06-02: qty 100

## 2016-06-02 MED ORDER — TRACE MINERALS CR-CU-MN-SE-ZN 10-1000-500-60 MCG/ML IV SOLN
INTRAVENOUS | Status: DC
  Filled 2016-06-02: qty 720

## 2016-06-02 MED ORDER — SODIUM CHLORIDE 0.9 % IV SOLN
30.0000 meq | Freq: Once | INTRAVENOUS | Status: AC
  Administered 2016-06-02: 30 meq via INTRAVENOUS
  Filled 2016-06-02: qty 15

## 2016-06-02 MED ORDER — INSULIN ASPART 100 UNIT/ML ~~LOC~~ SOLN
0.0000 [IU] | Freq: Four times a day (QID) | SUBCUTANEOUS | Status: DC
  Administered 2016-06-04 – 2016-06-06 (×2): 1 [IU] via SUBCUTANEOUS
  Filled 2016-06-02 (×2): qty 1

## 2016-06-02 MED ORDER — IOPAMIDOL (ISOVUE-300) INJECTION 61%
15.0000 mL | INTRAVENOUS | Status: AC
  Administered 2016-06-02 (×2): 15 mL via ORAL

## 2016-06-02 NOTE — Progress Notes (Signed)
Nutrition Follow-up  DOCUMENTATION CODES:   Non-severe (moderate) malnutrition in context of chronic illness  INTERVENTION:  When PICC line placed and confirmed, recommend initiating Clinimix E 5/20 at 30 ml/hr. On 2/18 if electrolytes and CBGs are WNL can advance to goal of Clinimix E 5/20 @ 65 ml/hr. If baseline TG WNL can initiate 20% ILE at 20 ml/hr over 12 hours on 2/18.  Goal regimen provides 1853 kcal, 78 grams of protein, 1800 ml fluid daily.  Consider decreasing IV fluids with initiation of TPN.  Monitor magnesium, potassium, and phosphorus daily for at least 3 days, MD to replete as needed, as pt is at risk for refeeding syndrome given moderate malnutrition, inadequate intake for 8 days.  Will discontinue order for [facility] as patient NPO.  NUTRITION DIAGNOSIS:   Malnutrition (Moderate) related to chronic illness, poor appetite (abdominal pain, NV) as evidenced by energy intake < 75% for > or equal to 1 month, moderate depletions of muscle mass, moderate depletion of body fat.  Ongoing.  GOAL:   Patient will meet greater than or equal to 90% of their needs  Not met.   MONITOR:   PO intake, Supplement acceptance, Labs, I & O's, Weight trends  REASON FOR ASSESSMENT:   Malnutrition Screening Tool    ASSESSMENT:   56 year old male with no PMHx presented with abdominal pain and non-bloody emesis admitted for persistent tachycardia.   Patient now day 8 of inadequate intake. He was made NPO on 2/15. CT Abdomen/Pelvis 2/16 showing findings consistent with high-grade distal small bowel obstruction, likely due to adhesions. Surgery has been consulted in setting of SBO.   Discussed nutrition plan of care with Dr. Verdell Carmine. Plan is to initiate TPN today pending PICC placement. Appropriate to initiate TPN today.   Access: order for PICC placed at 1041  Medications reviewed and include: Colace, folic acid 1 mg daily, multivitamin with minerals daily, pantoprazole,  thiamine 100 mg daily, NS with KCl 20 mEq/L @ 125 ml/hr.   Labs reviewed: Sodium 134, Potassium 3.4, Creatinine 0.45, Magnesium 1.4.  Magnesium repletion has been ordered.   NGT: 800 ml output from 0700 2/15 to 0700 2/16  Urine output unmeasured.   Discussed with RN. Plan is for PICC line to be placed today.  Diet Order:  Diet NPO time specified Except for: Sips with Meds  Skin:  Reviewed, no issues  Last BM:  05/28/2016  Height:   Ht Readings from Last 1 Encounters:  05/26/16 _0  (1.854 m)    Weight:   Wt Readings from Last 1 Encounters:  06/02/16 153 lb 14.4 oz (69.8 kg)    Ideal Body Weight:  83.6 kg  BMI:  Body mass index is 20.3 kg/m.  Estimated Nutritional Needs:   Kcal:  1900-2065 (MSJ x 1.2-1.3)  Protein:  70-83 grams (1-1.2 grams/kg)  Fluid:  2 L/day (30 ml/kg)  EDUCATION NEEDS:   No education needs identified at this time  Willey Blade, MS, RD, LDN Pager: [phone] After Hours Pager: [phone]

## 2016-06-02 NOTE — Progress Notes (Signed)
MEDICATION RELATED CONSULT NOTE - INITIAL   Pharmacy Consult for Electrolyte Monitoring and Replacement   Allergies  Allergen Reactions  . Milk-Related Compounds     Patient Measurements: Height: 6\' 1"  (185.4 cm) Weight: 153 lb 14.4 oz (69.8 kg) IBW/kg (Calculated) : 79.9  Vital Signs: Temp: 99.5 F (37.5 C) (02/16 0442) Temp Source: Oral (02/16 0442) BP: 162/97 (02/16 0442) Pulse Rate: 116 (02/16 0554) Intake/Output from previous day: 02/15 0701 - 02/16 0700 In: 2614.6 [I.V.:2564.6; IV Piggyback:50] Out: 800 [Emesis/NG output:800] Intake/Output from this shift: No intake/output data recorded.  Labs:  Recent Labs  05/30/16 0749 05/31/16 0529 06/02/16 0353  CREATININE 0.53* 0.56* 0.45*  MG 1.4* 1.8 1.4*  PHOS  --   --  2.6   Estimated Creatinine Clearance: 104.2 mL/min (by C-G formula based on SCr of 0.45 mg/dL (L)).   Microbiology: Recent Results (from the past 720 hour(s))  MRSA PCR Screening     Status: None   Collection Time: 05/26/16  9:57 AM  Result Value Ref Range Status   MRSA by PCR NEGATIVE NEGATIVE Final    Comment:        The GeneXpert MRSA Assay (FDA approved for NASAL specimens only), is one component of a comprehensive MRSA colonization surveillance program. It is not intended to diagnose MRSA infection nor to guide or monitor treatment for MRSA infections.    Assessment: 56 y/o M with a h/o EtOH abuse admitted with abdominal pain and tachycardia. Patient is currently NPO.  K = 3.4, Mg = 1.4, Phos = 2.6  Plan:  Give magnesium sulfate 4 g IV x 1 dose Patient is on maintenance fluids 0.9% NaCl with 20 mEq KCl/L at a rate of 125 mL/hr.   Will recheck electrolytes with AM labs tomorrow.   Cindi CarbonMary M Vaida Kerchner, PharmD Clinical Pharmacist 06/02/2016,7:27 AM

## 2016-06-02 NOTE — Progress Notes (Signed)
PHARMACY - ADULT TOTAL PARENTERAL NUTRITION CONSULT NOTE   Pharmacy Consult for TPN monitoring (electrolyte and BG) Indication: ileus  Patient Measurements: Height: 6\' 1"  (185.4 cm) Weight: 153 lb 14.4 oz (69.8 kg) IBW/kg (Calculated) : 79.9 TPN AdjBW (KG): 66.1 Body mass index is 20.3 kg/m.    Insulin requirements in the past 24 hours: None ordered Lytes: K 3.4, Mg 1.4, Phos 2.6    Best Practices:  TPN Access: Order for PICC line to be placed on 2/16 TPN start date: 2/16    Plan:  Start Clinimix E 5/20 at 30 ml/hr per dietician recommendation.  Baseline labs ordered.  Replaced electrolytes with magnesium sulfate 4 g IV x1 and KCl 30 mEq IV x 1.   Ordered SSI and blood glucose checks q6h starting at 1800 this evening.   Will recheck electrolytes with AM labs tomorrow as patient is at risk for refeeding syndrome.   Cindi CarbonMary M Kolbie Lepkowski, PharmD, BCPS Clinical Pharmacist 06/02/2016,2:33 PM

## 2016-06-02 NOTE — Progress Notes (Signed)
[facility] - [place] at [facility]   PATIENT NAME: Willie Baker    MR#:  [id number]  DATE OF BIRTH:  06/07/1960  SUBJECTIVE:   KUB this a.m. Showing Early SBO and CT abd/pelvis this a.m. Showing distal SBO. NG tube was placed yesterday and cont. On intermittent suction. Remains lethargic/altered.   REVIEW OF SYSTEMS:   Review of Systems  Unable to perform ROS: Mental acuity  Constitutional: Negative for fever.   Tolerating Diet: No  DRUG ALLERGIES:   Allergies  Allergen Reactions  . Milk-Related Compounds     VITALS:  Blood pressure (!) 145/94, pulse (!) 126, temperature 98.6 F (37 C), temperature source Axillary, resp. rate 18, height 6\' 1"  (1.854 m), weight 69.8 kg (153 lb 14.4 oz), SpO2 94 %.  PHYSICAL EXAMINATION:   Physical Exam  GENERAL:  56 y.o.-year-old patient lying in  bed lethargic/encephalopathic EYES: Pupils equal, round, reactive to light. No scleral icterus.   HEENT: Head atraumatic, normocephalic. Oropharynx and nasopharynx clear. Dry Oral Mucosa.  NG tube in place.  NECK:  Supple, no jugular venous distention. No thyroid enlargement, no tenderness.  LUNGS: Normal breath sounds bilaterally, no wheezing, rales, upper airway rhonchi. No use of accessory muscles of respiration.  CARDIOVASCULAR: S1, S2 normal. No murmurs, rubs, or gallops. tachycardia ABDOMEN: Soft, nontender, slightly distended. Hypoactive BS. No organomegaly or mass.  EXTREMITIES: No cyanosis, clubbing or edema b/l.    NEUROLOGIC: Cranial nerves II through XII are intact. No focal Motor or sensory deficits b/l.  Encephalopathic PSYCHIATRIC:  patient is alert and oriented x 1.  Encephalopathic.  SKIN: No obvious rash, lesion, or ulcer.   LABORATORY PANEL:  CBC  Recent Labs Lab 06/02/16 1051  WBC 5.9  HGB 12.9*  HCT 38.4*  PLT 177    Chemistries   Recent Labs Lab 06/02/16 0353 06/02/16 1259  NA 134*  --   K 3.4*  --   CL 104  --   CO2 23  --    GLUCOSE 103*  --   BUN 6  --   CREATININE 0.45*  --   CALCIUM 8.2*  --   MG 1.4*  --   AST  --  32  ALT  --  22  ALKPHOS  --  54  BILITOT  --  1.6*   Cardiac Enzymes No results for input(s): TROPONINI in the last 168 hours. RADIOLOGY:  Dg Abd 1 View  Result Date: 06/02/2016 CLINICAL DATA:  Ileus EXAM: ABDOMEN - 1 VIEW COMPARISON:  06/01/2016; 05/31/2016 FINDINGS: Worsening diffuse gaseous distension of the small bowel with index loop of small bowel in left mid hemiabdomen measuring 5 cm in diameter. A small amount of air is seen within the hepatic flexure of the colon, otherwise, there is a paucity of colonic gas. Nondiagnostic evaluation for pneumoperitoneum. No pneumatosis or portal venous gas. Enteric tube tip and side port projected the expected location of the gastric fundus. No definitive abnormal intra-abdominal calcifications. No acute osseus abnormalities. IMPRESSION: Findings most suggestive of worsening small bowel obstruction. Continued attention on follow-up is recommended. Electronically Signed   By: Simonne ComeJohn  Watts M.D.   On: 06/02/2016 07:47   Ct Abdomen Pelvis W Contrast  Result Date: 06/02/2016 CLINICAL DATA:  Abdominal pain and emesis. EXAM: CT ABDOMEN AND PELVIS WITH CONTRAST TECHNIQUE: Multidetector CT imaging of the abdomen and pelvis was performed using the standard protocol following bolus administration of intravenous contrast. CONTRAST:  100mL ISOVUE-300 IOPAMIDOL (ISOVUE-300) INJECTION 61% COMPARISON:  None. FINDINGS: Lower chest: Small bilateral pleural effusions with overlying atelectasis. The heart is normal in size. There is an NG tube coursing down the esophagus and into the stomach. Hepatobiliary: Diffuse and fairly marked fatty infiltration of liver above no focal hepatic lesions or intrahepatic biliary dilatation. The the gallbladder is normal. No common bile duct dilatation. Pancreas: No mass, inflammation or ductal dilatation. Spleen: Normal size.  No focal  lesions. Adrenals/Urinary Tract: The adrenal glands and kidneys are normal except for small cysts. The bladder is unremarkable. Stomach/Bowel: Markedly dilated small bowel loops with air fluid and air contrast levels consistent with small bowel obstruction. The colon is relatively decompressed. There are normal/ decompressed distal ileal small bowel loops but consistent with a distal small bowel obstruction likely due to adhesions. No mass or acute inflammatory process. The appendix is normal. Vascular/Lymphatic: The aorta branch vessels patent. The major venous structures are patent. Scattered aortic and iliac artery atherosclerotic calcifications. Small mesenteric and retroperitoneal lymph nodes but no mass or overt adenopathy. Reproductive: The prostate gland and seminal vesicles are unremarkable. Other: Small amount of free abdominal and free pelvic fluid due to the small bowel obstruction. No pelvic mass or adenopathy. No inguinal mass or adenopathy. No abdominal wall or inguinal hernia. Musculoskeletal: No significant bony findings. IMPRESSION: 1. CT findings consistent with a high-grade distal small bowel obstruction, likely due to adhesions. Associated free abdominal and free pelvic fluid. No findings for perforation. 2. Small bilateral pleural effusions with overlying atelectasis. 3. Diffuse fatty infiltration liver. Electronically Signed   By: Rudie Meyer M.D.   On: 06/02/2016 12:15   Dg Abd Portable 1v  Result Date: 06/01/2016 CLINICAL DATA:  Enteric tube placement EXAM: PORTABLE ABDOMEN - 1 VIEW COMPARISON:  05/31/2016 abdominal radiograph FINDINGS: Enteric tube terminates in the proximal stomach, with the side port at the esophagogastric junction. Diffusely dilated small bowel loops throughout the visualized abdomen measuring up to 3.9 cm and mild gaseous distention of the visualized large bowel, not convincingly changed. No evidence of pneumatosis or pneumoperitoneum. Clear lung bases.  IMPRESSION: 1. Enteric tube terminates in the proximal stomach, with the side port at the esophagogastric junction, consider advancing 4 cm. 2. Stable diffuse small and large bowel dilatation in the visualized upper abdomen, suggesting adynamic ileus, with distal small bowel obstruction not excluded. Electronically Signed   By: Delbert Phenix M.D.   On: 06/01/2016 16:47   ASSESSMENT AND PLAN:   56 yo male with no past medical history presented to the ED with abdominal pain and non-bloody emesis. Admitted for persistent tachycardia.  1. SBO/Ileus - pt. Has been distended with hypoactive bowel sounds the past 48 hours. KUB this morning suggestive of early small bowel obstruction. CT scan the abdomen pelvis showing distal small bowel obstruction. -Surgical consult obtained and as per them patient is high risk for any surgical intervention. Continue conservative management with NG tube decompression, IV fluids antibiotics and supportive care.  2. Alcohol abuse/withdrawal- off Precedex gtt now and will cont. CIWA.  - very slow to improve.    3. Epigastric pain w/ intractable nausea and vomiting - likely due to gastritis secondary to alcohol abuse. No acute issue. - Continue IV Protonix  4. Hyponatremia -due to alcohol abuse - resolved w/ IV fluids.    5. Hypokalemia/Hypomagnesemia - improved w/ supplementation and will cont. To monitor.  6. Nutrition - due to SBO/Ilues now will hold off on enteral feedings.  - TPN to be started.   Case discussed with  Care Management/Social Worker. Management plans discussed with the patient, family and they are in agreement.  CODE STATUS: full  DVT Prophylaxis: Ted's & SCD's  TOTAL TIME TAKING CARE OF THIS PATIENT: 30 minutes.    Note: This dictation was prepared with Dragon dictation along with smaller phrase technology. Any transcriptional errors that result from this process are unintentional.  [place] Siren M.D on 06/02/2016 at 2:29 PM  Between  7am to 6pm - Pager - [phone]  After 6pm go to [email or web address] - Social research officer, government  [facility]  Office  [phone]  CC: Primary care physician; No PCP Per Patient

## 2016-06-02 NOTE — Consult Note (Signed)
Patient ID: Willie Baker, male   DOB: [birth year], 56 y.o.   MRN: [id number]  HPI Willie Baker is a 56 y.o. male asked to see in consultation by Dr. Cherlynn Kaiser for potential bowel obstruction. Was admitted 6 days ago for abdominal pain and nausea. The history is taken from her GERD from that is the person that is making decisions for her. According to her she states that he has been having abdominal pain for the last 6 months and they have not been able to figure it out ossa. They were told apparently that he may have a peptic ulcer but never had any GI workup performed. After being admitted 6 days ago he developed DTs. He is a heavy drinker. He needed to go to the ICU for monitoring and adequate sedation. He was placed in a Precedex drip. He is getting Ativan and Haldol with good response. He was recently transferred to the floor and but noted that abdominal exam actually worsen as far as his distention. Because of his DVT and mentation and he has been unable to provide a good history. Serial abdominal exams as well as serial x-rays show evidence of distended small bowel and colon. I have personally reviewed all his films including a most recent CT scan that he was just done. there is no evidence of free air and there is no evidence of pneumatosis but there is persistent dilation of both the small bowel and the large bowel. Radiologist is calling a High grade obstruction but I do note air in the colon and given his clinical presentation  This is more c/w a functional rather than a mechanical process.  His workup included also magnesium that has been low from 1.4 and a potassium of 3.4 as well as hyponatremia that has been corrected. Apparently no previous abdominal operations.  HPI  History reviewed. No pertinent past medical history.  Past Surgical History:  Procedure Laterality Date  . FACIAL RECONSTRUCTION SURGERY      Family History  Problem Relation Age of Onset  . Breast cancer  Mother     Social History Social History  Substance Use Topics  . Smoking status: Current Every Day Smoker  . Smokeless tobacco: Never Used  . Alcohol use Yes    Allergies  Allergen Reactions  . Milk-Related Compounds     Current Facility-Administered Medications  Medication Dose Route Frequency Provider Last Rate Last Dose  . 0.9 % NaCl with KCl 20 mEq/ L  infusion   Intravenous Continuous Eugenie Norrie, NP 125 mL/hr at 06/02/16 1130    . acetaminophen (TYLENOL) tablet 650 mg  650 mg Oral Q6H PRN Arnaldo Natal, MD   650 mg at 05/25/16 1935   Or  . acetaminophen (TYLENOL) suppository 650 mg  650 mg Rectal Q6H PRN Arnaldo Natal, MD   650 mg at 05/30/16 2235  . docusate sodium (COLACE) capsule 100 mg  100 mg Oral BID Arnaldo Natal, MD   100 mg at 05/28/16 1008  . feeding supplement (BOOST / RESOURCE BREEZE) liquid 1 Container  1 Container Oral TID BM Enedina Finner, MD   1 Container at 05/31/16 1000  . folic acid 1 mg in sodium chloride 0.9 % 50 mL IVPB  1 mg Intravenous Daily Valentina Gu, RPH   1 mg at 06/02/16 4098  . haloperidol lactate (HALDOL) injection 2.5 mg  2.5 mg Intravenous Q6H PRN Houston Siren, MD   2.5 mg at 06/01/16  2141  . labetalol (NORMODYNE,TRANDATE) injection 10 mg  10 mg Intravenous Q2H PRN Oralia Manis, MD   10 mg at 06/01/16 1635  . LORazepam (ATIVAN) tablet 1 mg  1 mg Oral Q4H PRN Houston Siren, MD       Or  . LORazepam (ATIVAN) injection 1 mg  1 mg Intravenous Q6H PRN Houston Siren, MD   1 mg at 06/02/16 0327  . LORazepam (ATIVAN) injection 1-2 mg  1-2 mg Intravenous Q1H PRN Houston Siren, MD   2 mg at 06/02/16 0249  . MEDLINE mouth rinse  15 mL Mouth Rinse BID Houston Siren, MD   15 mL at 06/01/16 2142  . metoprolol (LOPRESSOR) injection 5 mg  5 mg Intravenous Q6H PRN Oralia Manis, MD   5 mg at 06/02/16 0127  . multivitamin with minerals tablet 1 tablet  1 tablet Oral Daily Arnaldo Natal, MD   1 tablet at 05/28/16 1009  . nicotine  (NICODERM CQ - dosed in mg/24 hr) patch 14 mg  14 mg Transdermal Daily Oralia Manis, MD   14 mg at 06/01/16 0216  . ondansetron (ZOFRAN) tablet 4 mg  4 mg Oral Q6H PRN Arnaldo Natal, MD       Or  . ondansetron [facility]) injection 4 mg  4 mg Intravenous Q6H PRN Arnaldo Natal, MD      . pantoprazole (PROTONIX) injection 40 mg  40 mg Intravenous Q24H Valentina Gu, RPH   40 mg at 06/01/16 1318  . sodium chloride flush (NS) 0.9 % injection 3 mL  3 mL Intravenous Q12H Arnaldo Natal, MD   3 mL at 06/02/16 0953  . thiamine (VITAMIN B-1) tablet 100 mg  100 mg Oral Daily Arnaldo Natal, MD   100 mg at 05/28/16 1008   Or  . thiamine (B-1) injection 100 mg  100 mg Intravenous Daily Arnaldo Natal, MD   100 mg at 06/02/16 2956  . tiotropium (SPIRIVA) inhalation capsule 18 mcg  18 mcg Inhalation Daily Arnaldo Natal, MD   18 mcg at 05/25/16 2130     Review of Systems A full review of systems was asked To the girlfriendand was negative except for the information on the HPI. I Could not confirm this with the patient given his encephalopathic state  Physical Exam Blood pressure (!) 157/92, pulse (!) 108, temperature 98.9 F (37.2 C), temperature source Axillary, resp. rate 20, height 6\' 1"  (1.854 m), weight 69.8 kg (153 lb 14.4 oz), SpO2 95 %. CONSTITUTIONAL: PT chronically ill, not following commands. Seems jaundice EYES: Pupils are equal, round, and reactive to light,. LYMPH NODES:  Lymph nodes in the neck are normal. RESPIRATORY:  Lungs are clear. There is normal respiratory effort, with equal breath sounds bilaterally, and without pathologic use of accessory muscles. CARDIOVASCULAR: Heart is regular without murmurs, gallops, or rubs. GI: The abdomen is  Soft but distended, nontender,  There are no palpable masses. There is no hepatosplenomegaly. There are normal bowel sounds in all quadrants. GU: Rectal deferred.   MUSCULOSKELETAL: Normal muscle strength and tone. No cyanosis or  edema.   SKIN: Turgor is good and there are no pathologic skin lesions or ulcers. Neuro: no focal deficits, somnolent, localizes pain. Encephalopatic.  PSYCH:  Oriented to person, place and time. Affect is normal.  Data Reviewed I have personally reviewed the patient's imaging, laboratory findings and medical records.    Assessment/Plan 56 year old male presented with abdominal pain and  has been in DTs for the last 5-6 days. He does have abdominal distention and changes c/with small bowel obstruction. Think that is mild bowel obstruction is partial given the fact that his Air in the cecum.  I do think that given all of  his comorbidities including his mental status and his withdrawal state,  makes him  A very high risk for any operative intervention and I would even say that it is a prohibitive risk given his overall poor prognosis. For now I  will like to stratify his risk and also interrogate his liver function because he may be very well cirrhotic as well. I have ordered LFTs and coags to assess his liver fx. Given all the circumstances and given the fact that I don't feel pedal than emergent surgical intervention is required we'll treat him conservatively with an NG tube nothing by mouth, correction of electrolytes and IV fluids. Again and I'll have to talk again with the family and see what his wishes where but even if this become a surgical disease I will not be comfortable taking him to the OR because of his increased this with the girlfriend in detail and she understands. We'll continue to follow him   Sterling Bigiego Pabon, MD FACS General Surgeon 06/02/2016, 12:09 PM

## 2016-06-03 ENCOUNTER — Inpatient Hospital Stay: Payer: Medicaid Other

## 2016-06-03 LAB — URINALYSIS, ROUTINE W REFLEX MICROSCOPIC
Bilirubin Urine: NEGATIVE
Glucose, UA: NEGATIVE mg/dL
Hgb urine dipstick: NEGATIVE
Ketones, ur: 5 mg/dL — AB
LEUKOCYTES UA: NEGATIVE
NITRITE: NEGATIVE
PH: 6 (ref 5.0–8.0)
Protein, ur: NEGATIVE mg/dL
SPECIFIC GRAVITY, URINE: 1.019 (ref 1.005–1.030)

## 2016-06-03 LAB — PROTIME-INR
INR: 1.26
PROTHROMBIN TIME: 15.9 s — AB (ref 11.4–15.2)

## 2016-06-03 LAB — CBC
HCT: 37.7 % — ABNORMAL LOW (ref 40.0–52.0)
HEMOGLOBIN: 13 g/dL (ref 13.0–18.0)
MCH: 33.8 pg (ref 26.0–34.0)
MCHC: 34.6 g/dL (ref 32.0–36.0)
MCV: 97.7 fL (ref 80.0–100.0)
Platelets: 174 10*3/uL (ref 150–440)
RBC: 3.85 MIL/uL — ABNORMAL LOW (ref 4.40–5.90)
RDW: 13 % (ref 11.5–14.5)
WBC: 7.6 10*3/uL (ref 3.8–10.6)

## 2016-06-03 LAB — COMPREHENSIVE METABOLIC PANEL
ALBUMIN: 2.8 g/dL — AB (ref 3.5–5.0)
ALK PHOS: 52 U/L (ref 38–126)
ALT: 20 U/L (ref 17–63)
ANION GAP: 7 (ref 5–15)
AST: 29 U/L (ref 15–41)
BUN: 7 mg/dL (ref 6–20)
CO2: 24 mmol/L (ref 22–32)
Calcium: 8.2 mg/dL — ABNORMAL LOW (ref 8.9–10.3)
Chloride: 99 mmol/L — ABNORMAL LOW (ref 101–111)
Creatinine, Ser: 0.46 mg/dL — ABNORMAL LOW (ref 0.61–1.24)
GFR calc Af Amer: >60 mL/min (ref >60)
GFR calc non Af Amer: >60 mL/min (ref >60)
GLUCOSE: 111 mg/dL — AB (ref 65–99)
POTASSIUM: 3.4 mmol/L — AB (ref 3.5–5.1)
SODIUM: 130 mmol/L — AB (ref 135–145)
Total Bilirubin: 1.8 mg/dL — ABNORMAL HIGH (ref 0.3–1.2)
Total Protein: 6.8 g/dL (ref 6.5–8.1)

## 2016-06-03 LAB — MAGNESIUM: Magnesium: 1.5 mg/dL — ABNORMAL LOW (ref 1.7–2.4)

## 2016-06-03 LAB — GLUCOSE, CAPILLARY
Glucose-Capillary: 101 mg/dL — ABNORMAL HIGH (ref 65–99)
Glucose-Capillary: 114 mg/dL — ABNORMAL HIGH (ref 65–99)
Glucose-Capillary: 116 mg/dL — ABNORMAL HIGH (ref 65–99)
Glucose-Capillary: 94 mg/dL (ref 65–99)

## 2016-06-03 LAB — PHOSPHORUS: Phosphorus: 2.7 mg/dL (ref 2.5–4.6)

## 2016-06-03 MED ORDER — PIPERACILLIN-TAZOBACTAM 3.375 G IVPB
3.3750 g | Freq: Three times a day (TID) | INTRAVENOUS | Status: DC
  Administered 2016-06-03 – 2016-06-06 (×9): 3.375 g via INTRAVENOUS
  Filled 2016-06-03 (×9): qty 50

## 2016-06-03 MED ORDER — TRACE MINERALS CR-CU-MN-SE-ZN 10-1000-500-60 MCG/ML IV SOLN
INTRAVENOUS | Status: AC
  Administered 2016-06-03: 17:00:00 via INTRAVENOUS
  Filled 2016-06-03: qty 1560

## 2016-06-03 MED ORDER — SODIUM CHLORIDE 0.9 % IV SOLN
30.0000 meq | Freq: Once | INTRAVENOUS | Status: AC
  Administered 2016-06-03: 30 meq via INTRAVENOUS
  Filled 2016-06-03: qty 15

## 2016-06-03 MED ORDER — MAGNESIUM SULFATE 4 GM/100ML IV SOLN
4.0000 g | Freq: Once | INTRAVENOUS | Status: AC
  Administered 2016-06-03: 09:00:00 4 g via INTRAVENOUS
  Filled 2016-06-03: qty 100

## 2016-06-03 MED ORDER — LACTATED RINGERS IV BOLUS (SEPSIS)
1000.0000 mL | Freq: Once | INTRAVENOUS | Status: AC
  Administered 2016-06-03: 1000 mL via INTRAVENOUS

## 2016-06-03 MED ORDER — FAT EMULSION 20 % IV EMUL
250.0000 mL | INTRAVENOUS | Status: AC
  Administered 2016-06-03: 250 mL via INTRAVENOUS
  Filled 2016-06-03: qty 250

## 2016-06-03 MED ORDER — DEXTROSE 10 % IV SOLN
INTRAVENOUS | Status: DC
  Administered 2016-06-03: 03:00:00 via INTRAVENOUS

## 2016-06-03 NOTE — Progress Notes (Signed)
Size 16 NG tube placed to pt's L nare with assistance from ActonBrandi, Charity fundraiserN. Placement verified at bedside with auscultation. Will also verify placement via abd Xray. Tube clamped until xray verification can be obtained.

## 2016-06-03 NOTE — Progress Notes (Signed)
Spoke with Dr Tonita CongWoodham, made him aware of x2 unsuccessful attempts at NG tube placement, Pt refusing to have another NG tube placed, MD aware states ok to leave NG out since pt is refusing

## 2016-06-03 NOTE — Progress Notes (Signed)
During rounds Dr. Cherlynn KaiserSainani gave verbal order to D/C telemetry.

## 2016-06-03 NOTE — Progress Notes (Addendum)
During bedside rounding pt was found with NG tube dislodged and had a BM. NG tube removed, and MD paged. Awaiting for MD to call back. Oncoming nurse, Willie BangsAmanda Perez, RN aware.   Update 1954: Per Dr. Tonita CongWoodham pt needs NG tube replaced with placement verified by xray. Marchelle FolksAmanda, RN attempted to replace NG tube with assistance from myself and Jamesetta Sohyllis, RN  x 2 times without success. Pt refusing to allow another attempt. Dr. Tonita CongWoodham paged, awaiting him to call back.

## 2016-06-03 NOTE — Progress Notes (Signed)
[facility] [place] [facility]   PATIENT NAME: Willie Baker    MR#:  [id number]  DATE OF BIRTH:  09/02/1960  SUBJECTIVE:   he remains lethargic, encephalopathic. KUB from this morning still showing evidence of distal small bowel obstruction. Patient spiked a fever of 101 this afternoon.  REVIEW OF SYSTEMS:   Review of Systems  Unable to perform ROS: Mental acuity  Constitutional: Negative for fever.   Tolerating Diet: No  DRUG ALLERGIES:   Allergies  Allergen Reactions  . Milk-Related Compounds     VITALS:  Blood pressure 138/88, pulse (!) 120, temperature (!) 101.2 F (38.4 C), temperature source Oral, resp. rate 18, height 6\' 1"  (1.854 m), weight 69.7 kg (153 lb 9.6 oz), SpO2 100 %.  PHYSICAL EXAMINATION:   Physical Exam  GENERAL:  56 y.o.-year-old patient lying in  bed lethargic/encephalopathic EYES: Pupils equal, round, reactive to light. No scleral icterus.   HEENT: Head atraumatic, normocephalic. Oropharynx and nasopharynx clear. Dry Oral Mucosa.  NG tube in place.  NECK:  Supple, no jugular venous distention. No thyroid enlargement, no tenderness.  LUNGS: Normal breath sounds bilaterally, no wheezing, rales, upper airway rhonchi. No use of accessory muscles of respiration.  CARDIOVASCULAR: S1, S2 normal. No murmurs, rubs, or gallops. tachycardia ABDOMEN: Soft, nontender, distended. Hypoactive BS. No organomegaly or mass.  EXTREMITIES: No cyanosis, clubbing or edema b/l.    NEUROLOGIC: Cranial nerves II through XII are intact. No focal Motor or sensory deficits b/l.  Encephalopathic PSYCHIATRIC:  patient is alert and oriented x 1.  Encephalopathic.  SKIN: No obvious rash, lesion, or ulcer.   LABORATORY PANEL:  CBC  Recent Labs Lab 06/03/16 0610  WBC 7.6  HGB 13.0  HCT 37.7*  PLT 174    Chemistries   Recent Labs Lab 06/03/16 0610  NA 130*  K 3.4*  CL 99*  CO2 24  GLUCOSE 111*  BUN 7  CREATININE 0.46*  CALCIUM 8.2*   MG 1.5*  AST 29  ALT 20  ALKPHOS 52  BILITOT 1.8*   Cardiac Enzymes No results for input(s): TROPONINI in the last 168 hours. RADIOLOGY:  Dg Abd 1 View  Result Date: 06/03/2016 CLINICAL DATA:  NG tube placement EXAM: ABDOMEN - 1 VIEW COMPARISON:  06/03/2016; CT abdomen and pelvis - 06/02/2016 FINDINGS: Interval placement of enteric tube with tip and side port project over the expected location of the gastric fundus. Worsening moderate gas distention of predominantly the small bowel with index loop of small bowel within the right mid hemiabdomen measuring approximately 4 cm in diameter. This finding is again associated with a conspicuous paucity of distal colonic gas remains concerning for worsening small bowel obstruction. No supine evidence of pneumoperitoneum. No pneumatosis or portal venous gas. No definitive abnormal intra-abdominal calcifications. IMPRESSION: 1. Enteric tube tip and side port projected the expected location of the gastric fundus. 2. Findings suggestive of worsening distal small bowel obstruction. Electronically Signed   By: Simonne Come M.D.   On: 06/03/2016 11:58   Dg Abd 1 View  Result Date: 06/02/2016 CLINICAL DATA:  Ileus EXAM: ABDOMEN - 1 VIEW COMPARISON:  06/01/2016; 05/31/2016 FINDINGS: Worsening diffuse gaseous distension of the small bowel with index loop of small bowel in left mid hemiabdomen measuring 5 cm in diameter. A small amount of air is seen within the hepatic flexure of the colon, otherwise, there is a paucity of colonic gas. Nondiagnostic evaluation for pneumoperitoneum. No pneumatosis or portal venous gas. Enteric tube  tip and side port projected the expected location of the gastric fundus. No definitive abnormal intra-abdominal calcifications. No acute osseus abnormalities. IMPRESSION: Findings most suggestive of worsening small bowel obstruction. Continued attention on follow-up is recommended. Electronically Signed   By: Simonne ComeJohn  Watts M.D.   On: 06/02/2016  07:47   Ct Abdomen Pelvis W Contrast  Result Date: 06/02/2016 CLINICAL DATA:  Abdominal pain and emesis. EXAM: CT ABDOMEN AND PELVIS WITH CONTRAST TECHNIQUE: Multidetector CT imaging of the abdomen and pelvis was performed using the standard protocol following bolus administration of intravenous contrast. CONTRAST:  100mL ISOVUE-300 IOPAMIDOL (ISOVUE-300) INJECTION 61% COMPARISON:  None. FINDINGS: Lower chest: Small bilateral pleural effusions with overlying atelectasis. The heart is normal in size. There is an NG tube coursing down the esophagus and into the stomach. Hepatobiliary: Diffuse and fairly marked fatty infiltration of liver above no focal hepatic lesions or intrahepatic biliary dilatation. The the gallbladder is normal. No common bile duct dilatation. Pancreas: No mass, inflammation or ductal dilatation. Spleen: Normal size.  No focal lesions. Adrenals/Urinary Tract: The adrenal glands and kidneys are normal except for small cysts. The bladder is unremarkable. Stomach/Bowel: Markedly dilated small bowel loops with air fluid and air contrast levels consistent with small bowel obstruction. The colon is relatively decompressed. There are normal/ decompressed distal ileal small bowel loops but consistent with a distal small bowel obstruction likely due to adhesions. No mass or acute inflammatory process. The appendix is normal. Vascular/Lymphatic: The aorta branch vessels patent. The major venous structures are patent. Scattered aortic and iliac artery atherosclerotic calcifications. Small mesenteric and retroperitoneal lymph nodes but no mass or overt adenopathy. Reproductive: The prostate gland and seminal vesicles are unremarkable. Other: Small amount of free abdominal and free pelvic fluid due to the small bowel obstruction. No pelvic mass or adenopathy. No inguinal mass or adenopathy. No abdominal wall or inguinal hernia. Musculoskeletal: No significant bony findings. IMPRESSION: 1. CT findings  consistent with a high-grade distal small bowel obstruction, likely due to adhesions. Associated free abdominal and free pelvic fluid. No findings for perforation. 2. Small bilateral pleural effusions with overlying atelectasis. 3. Diffuse fatty infiltration liver. Electronically Signed   By: Rudie MeyerP.  Gallerani M.D.   On: 06/02/2016 12:15   Dg Abd Acute W/chest  Result Date: 06/03/2016 CLINICAL DATA:  Followup small bowel obstruction. EXAM: DG ABDOMEN ACUTE W/ 1V CHEST COMPARISON:  CT, 06/02/2016 FINDINGS: Dilated loops of small bowel are evident consistent with the bowel obstruction noted on the previous day's CT. Air is also seen within a nondilated colon. No free air. Nasogastric tube passes below the diaphragm into the mid stomach. Cardiac silhouette is normal in size. No mediastinal or hilar masses. Clear lungs. Right PICC has its tip in the inferior vena cava just above the caval atrial junction. IMPRESSION: 1. Persistent small bowel obstruction, without significant change from the previous day's CT scan. 2. No free air. 3. No acute cardiopulmonary disease. Electronically Signed   By: Amie Portlandavid  Ormond M.D.   On: 06/03/2016 09:16   Dg Abd Portable 1v  Result Date: 06/01/2016 CLINICAL DATA:  Enteric tube placement EXAM: PORTABLE ABDOMEN - 1 VIEW COMPARISON:  05/31/2016 abdominal radiograph FINDINGS: Enteric tube terminates in the proximal stomach, with the side port at the esophagogastric junction. Diffusely dilated small bowel loops throughout the visualized abdomen measuring up to 3.9 cm and mild gaseous distention of the visualized large bowel, not convincingly changed. No evidence of pneumatosis or pneumoperitoneum. Clear lung bases. IMPRESSION: 1. Enteric tube terminates  in the proximal stomach, with the side port at the esophagogastric junction, consider advancing 4 cm. 2. Stable diffuse small and large bowel dilatation in the visualized upper abdomen, suggesting adynamic ileus, with distal small bowel  obstruction not excluded. Electronically Signed   By: Delbert Phenix M.D.   On: 06/01/2016 16:47   ASSESSMENT AND PLAN:   56 yo male with no past medical history presented to the ED with abdominal pain and non-bloody emesis. Admitted for persistent tachycardia.  1. SBO/Ileus - pt. Has been distended with hypoactive bowel sounds over the past 2-3 days. CT abd/pelvis suggestive of distal small bowel obstruction.  - cont. Supportive care with NG tube decompression, IV fluids, anti-emetics.  > 2 L of NG tube drainage yesterday.  -Surgical consult obtained and as per them patient is high risk for any surgical intervention. Continue conservative management with NG tube decompression, IV fluids antibiotics and supportive care. - prognosis is poor.   2. Alcohol abuse/withdrawal- off Precedex gtt now and will cont. CIWA.  - very slow to improve.    3. Hyponatremia -due to alcohol abuse - resolved w/ IV fluids.    4. Hypokalemia/Hypomagnesemia - improved w/ supplementation and will cont. To monitor.  5. Nutrition - due to SBO/Ilues now will hold off on enteral feedings.  - cont. TPN.   6. Fever of unknown origin - source unclear.  Tylenol given.  - will get UA, BC X 2.  Empirically start on Zosyn.   Pt's prognosis is poor.   Case discussed with Care Management/Social Worker. Management plans discussed with the patient, family and they are in agreement.  CODE STATUS: full  DVT Prophylaxis: Ted's & SCD's  TOTAL TIME TAKING CARE OF THIS PATIENT: 30 minutes.    Note: This dictation was prepared with Dragon dictation along with smaller phrase technology. Any transcriptional errors that result from this process are unintentional.  Houston Siren M.D on 06/03/2016 at 2:05 PM  Between 7am to 6pm - Pager - [phone]  After 6pm go to [email or web address] - Social research officer, government  [facility]  Office  [phone]  CC: Primary care physician; No PCP Per Patient

## 2016-06-03 NOTE — Progress Notes (Signed)
Pharmacy Antibiotic Note  Willie Baker is a 56 y.o. male admitted on 05/24/2016 with Illeus, now being treated for  sepsis.  Pharmacy has been consulted for Zosyn  dosing.  Plan: Will start patient on Zosyn 3.375 IV EI every 8 hours.   Height: 6\' 1"  (185.4 cm) Weight: 153 lb 9.6 oz (69.7 kg) IBW/kg (Calculated) : 79.9  Temp (24hrs), Avg:99.3 F (37.4 C), Min:98.6 F (37 C), Max:101.2 F (38.4 C)   Recent Labs Lab 05/29/16 0444 05/30/16 0749 05/31/16 0529 06/02/16 0353 06/02/16 1051 06/03/16 0610  WBC  --   --   --   --  5.9 7.6  CREATININE 0.39* 0.53* 0.56* 0.45*  --  0.46*    Estimated Creatinine Clearance: 104.1 mL/min (by C-G formula based on SCr of 0.46 mg/dL (L)).    Allergies  Allergen Reactions  . Milk-Related Compounds     Antimicrobials this admission: 2/17 Zosyn >>   Dose adjustments this admission:  Microbiology results: 2/17 BCx: pending 2/9  MRSA WUJ:[id number]:Negative   Thank you for allowing pharmacy to be a part of this patient's care.  Gardner CandleSheema M Janiylah Hannis, PharmD, BCPS Clinical Pharmacist 06/03/2016 1:23 PM

## 2016-06-03 NOTE — Progress Notes (Signed)
PHARMACY - ADULT TOTAL PARENTERAL NUTRITION CONSULT NOTE   Pharmacy Consult for TPN monitoring (electrolyte and BG) Indication: ileus  Patient Measurements: Height: 6\' 1"  (185.4 cm) Weight: 153 lb 9.6 oz (69.7 kg) IBW/kg (Calculated) : 79.9 TPN AdjBW (KG): 66.1 Body mass index is 20.27 kg/m.    Insulin requirements in the past 24 hours: 0  Lytes:  BMP Latest Ref Rng & Units 06/03/2016 06/02/2016 05/31/2016  Glucose 65 - 99 mg/dL 536(U111(H) 440(H103(H) 97  BUN 6 - 20 mg/dL 7 6 8   Creatinine 0.61 - 1.24 mg/dL 4.74(Q0.46(L) 5.95(G0.45(L) 3.87(F0.56(L)  Sodium 135 - 145 mmol/L 130(L) 134(L) 135  Potassium 3.5 - 5.1 mmol/L 3.4(L) 3.4(L) 4.6  Chloride 101 - 111 mmol/L 99(L) 104 107  CO2 22 - 32 mmol/L 24 23 18(L)  Calcium 8.9 - 10.3 mg/dL 8.2(L) 8.2(L) 8.5(L)   Magnesium  Date Value Ref Range Status  06/03/2016 1.5 (L) 1.7 - 2.4 mg/dL Final   Phosphorus  Date Value Ref Range Status  06/03/2016 2.7 2.5 - 4.6 mg/dL Final      Best Practices:  TPN Access: Order for PICC line to be placed on 2/16 TPN start date: 2/16    Plan:  Will advance TPN Clinimix E 5/20 to 65 ml/hr and add lipids as per dietary recommendations (baseline TG wnl).  Continue SSI and blood glucose checks q6h.  Will continue to check daily labs for now.   Valentina Guhristy, Zeeva Courser D, PharmD Clinical Pharmacist 06/03/2016,7:25 AM

## 2016-06-03 NOTE — Progress Notes (Addendum)
Pt's VS came back abnormally at: BP 138/88 (BP Location: Left Arm)   Pulse (!) 120   Temp (!) 101.2 F (38.4 C) (Oral)   Resp 18   Ht 6\' 1"  (1.854 m)   Wt 69.7 kg (153 lb 9.6 oz)   SpO2 100%   BMI 20.27 kg/m . Dr. Cherlynn KaiserSainani notified. Per his order will administer tylenol, obtain blood cx, collect a u/a (via in and out cath as pt has altered mentation and is currently incontinent), and start pt on empirical antibiotics.

## 2016-06-03 NOTE — Progress Notes (Signed)
RN reports TPN punctured during patient transport. D10W at 30 mL/hr ordered per protocol for TPN interruption.  Jenette Rayson A. Darlingookson, VermontPharm.D., BCPS Clinical Pharmacist 06/03/2016 [phone]

## 2016-06-03 NOTE — Progress Notes (Signed)
CC: partial SBO Subjective: Asst. delirious. I ABG personal review there is evidence of gas within the colon and actually I do see contrast within the colon. As compared to previous films the dilation of the small bowel is less. He Have significant NG tube output Now with some cough that is productive TPN was started Alb 2.8   Objective: Vital signs in last 24 hours: Temp:  [98.6 F (37 C)-98.9 F (37.2 C)] 98.6 F (37 C) (02/17 0654) Pulse Rate:  [115-130] 130 (02/17 0654) Resp:  [18-20] 19 (02/17 0442) BP: (132-167)/(86-108) 135/89 (02/17 0839) SpO2:  [94 %-97 %] 97 % (02/17 0442) Weight:  [69.7 kg (153 lb 9.6 oz)-70.8 kg (156 lb)] 69.7 kg (153 lb 9.6 oz) (02/17 0500) Last BM Date: 05/28/16  Intake/Output from previous day: 02/16 0701 - 02/17 0700 In: 3627 [I.V.:2211.8; NG/GT:1000; IV Piggyback:415.2] Out: 2150 [Emesis/NG output:2150] Intake/Output this shift: Total I/O In: 515 [I.V.:495; NG/GT:20] Out: -   Physical exam: Somnolent, localizes pain Abd: mildly distended, decrease bs, soft, No rebound no peritonitis Ext: well perfused, warm  Lab Results: CBC   Recent Labs  06/02/16 1051 06/03/16 0610  WBC 5.9 7.6  HGB 12.9* 13.0  HCT 38.4* 37.7*  PLT 177 174   BMET  Recent Labs  06/02/16 0353 06/03/16 0610  NA 134* 130*  K 3.4* 3.4*  CL 104 99*  CO2 23 24  GLUCOSE 103* 111*  BUN 6 7  CREATININE 0.45* 0.46*  CALCIUM 8.2* 8.2*   PT/INR  Recent Labs  06/03/16 0610  LABPROT 15.9*  INR 1.26   ABG  Recent Labs  06/02/16 1037  PHART 7.45  HCO3 25.0    Studies/Results: Dg Abd 1 View  Result Date: 06/02/2016 CLINICAL DATA:  Ileus EXAM: ABDOMEN - 1 VIEW COMPARISON:  06/01/2016; 05/31/2016 FINDINGS: Worsening diffuse gaseous distension of the small bowel with index loop of small bowel in left mid hemiabdomen measuring 5 cm in diameter. A small amount of air is seen within the hepatic flexure of the colon, otherwise, there is a paucity of colonic  gas. Nondiagnostic evaluation for pneumoperitoneum. No pneumatosis or portal venous gas. Enteric tube tip and side port projected the expected location of the gastric fundus. No definitive abnormal intra-abdominal calcifications. No acute osseus abnormalities. IMPRESSION: Findings most suggestive of worsening small bowel obstruction. Continued attention on follow-up is recommended. Electronically Signed   By: Simonne ComeJohn  Watts M.D.   On: 06/02/2016 07:47   Ct Abdomen Pelvis W Contrast  Result Date: 06/02/2016 CLINICAL DATA:  Abdominal pain and emesis. EXAM: CT ABDOMEN AND PELVIS WITH CONTRAST TECHNIQUE: Multidetector CT imaging of the abdomen and pelvis was performed using the standard protocol following bolus administration of intravenous contrast. CONTRAST:  100mL ISOVUE-300 IOPAMIDOL (ISOVUE-300) INJECTION 61% COMPARISON:  None. FINDINGS: Lower chest: Small bilateral pleural effusions with overlying atelectasis. The heart is normal in size. There is an NG tube coursing down the esophagus and into the stomach. Hepatobiliary: Diffuse and fairly marked fatty infiltration of liver above no focal hepatic lesions or intrahepatic biliary dilatation. The the gallbladder is normal. No common bile duct dilatation. Pancreas: No mass, inflammation or ductal dilatation. Spleen: Normal size.  No focal lesions. Adrenals/Urinary Tract: The adrenal glands and kidneys are normal except for small cysts. The bladder is unremarkable. Stomach/Bowel: Markedly dilated small bowel loops with air fluid and air contrast levels consistent with small bowel obstruction. The colon is relatively decompressed. There are normal/ decompressed distal ileal small bowel loops but consistent with  a distal small bowel obstruction likely due to adhesions. No mass or acute inflammatory process. The appendix is normal. Vascular/Lymphatic: The aorta branch vessels patent. The major venous structures are patent. Scattered aortic and iliac artery  atherosclerotic calcifications. Small mesenteric and retroperitoneal lymph nodes but no mass or overt adenopathy. Reproductive: The prostate gland and seminal vesicles are unremarkable. Other: Small amount of free abdominal and free pelvic fluid due to the small bowel obstruction. No pelvic mass or adenopathy. No inguinal mass or adenopathy. No abdominal wall or inguinal hernia. Musculoskeletal: No significant bony findings. IMPRESSION: 1. CT findings consistent with a high-grade distal small bowel obstruction, likely due to adhesions. Associated free abdominal and free pelvic fluid. No findings for perforation. 2. Small bilateral pleural effusions with overlying atelectasis. 3. Diffuse fatty infiltration liver. Electronically Signed   By: Rudie Meyer M.D.   On: 06/02/2016 12:15   Dg Abd Acute W/chest  Result Date: 06/03/2016 CLINICAL DATA:  Followup small bowel obstruction. EXAM: DG ABDOMEN ACUTE W/ 1V CHEST COMPARISON:  CT, 06/02/2016 FINDINGS: Dilated loops of small bowel are evident consistent with the bowel obstruction noted on the previous day's CT. Air is also seen within a nondilated colon. No free air. Nasogastric tube passes below the diaphragm into the mid stomach. Cardiac silhouette is normal in size. No mediastinal or hilar masses. Clear lungs. Right PICC has its tip in the inferior vena cava just above the caval atrial junction. IMPRESSION: 1. Persistent small bowel obstruction, without significant change from the previous day's CT scan. 2. No free air. 3. No acute cardiopulmonary disease. Electronically Signed   By: Amie Portland M.D.   On: 06/03/2016 09:16   Dg Abd Portable 1v  Result Date: 06/01/2016 CLINICAL DATA:  Enteric tube placement EXAM: PORTABLE ABDOMEN - 1 VIEW COMPARISON:  05/31/2016 abdominal radiograph FINDINGS: Enteric tube terminates in the proximal stomach, with the side port at the esophagogastric junction. Diffusely dilated small bowel loops throughout the visualized  abdomen measuring up to 3.9 cm and mild gaseous distention of the visualized large bowel, not convincingly changed. No evidence of pneumatosis or pneumoperitoneum. Clear lung bases. IMPRESSION: 1. Enteric tube terminates in the proximal stomach, with the side port at the esophagogastric junction, consider advancing 4 cm. 2. Stable diffuse small and large bowel dilatation in the visualized upper abdomen, suggesting adynamic ileus, with distal small bowel obstruction not excluded. Electronically Signed   By: Delbert Phenix M.D.   On: 06/01/2016 16:47    Anti-infectives: Anti-infectives    None      Assessment/Plan: Partial SBO on a very poor surgical candidate with active DT, malnutrition Per Films actually there is gas and contrast in the Large bowel indicating partial obstruction On exam no evidence of peritonitis No surgical intervention Continue NGT, TPN IVF  Sterling Big, MD, [facility]  06/03/2016

## 2016-06-03 NOTE — Progress Notes (Signed)
When rounding on pt found that he had removed his NG tube. Dr. Cherlynn KaiserSainani notified. Orders to replace NG tube received.

## 2016-06-04 ENCOUNTER — Inpatient Hospital Stay: Payer: Medicaid Other

## 2016-06-04 LAB — CBC
HCT: 33.6 % — ABNORMAL LOW (ref 40.0–52.0)
HEMOGLOBIN: 11.5 g/dL — AB (ref 13.0–18.0)
MCH: 32.9 pg (ref 26.0–34.0)
MCHC: 34.2 g/dL (ref 32.0–36.0)
MCV: 96.2 fL (ref 80.0–100.0)
Platelets: 176 10*3/uL (ref 150–440)
RBC: 3.5 MIL/uL — AB (ref 4.40–5.90)
RDW: 12.8 % (ref 11.5–14.5)
WBC: 8.4 10*3/uL (ref 3.8–10.6)

## 2016-06-04 LAB — PROTIME-INR
INR: 1.37
Prothrombin Time: 17 seconds — ABNORMAL HIGH (ref 11.4–15.2)

## 2016-06-04 LAB — MAGNESIUM: MAGNESIUM: 1.5 mg/dL — AB (ref 1.7–2.4)

## 2016-06-04 LAB — COMPREHENSIVE METABOLIC PANEL
ALBUMIN: 2.5 g/dL — AB (ref 3.5–5.0)
ALK PHOS: 44 U/L (ref 38–126)
ALT: 19 U/L (ref 17–63)
ANION GAP: 6 (ref 5–15)
AST: 28 U/L (ref 15–41)
BUN: 5 mg/dL — ABNORMAL LOW (ref 6–20)
CALCIUM: 8 mg/dL — AB (ref 8.9–10.3)
CO2: 25 mmol/L (ref 22–32)
CREATININE: 0.43 mg/dL — AB (ref 0.61–1.24)
Chloride: 97 mmol/L — ABNORMAL LOW (ref 101–111)
GFR calc Af Amer: >60 mL/min (ref >60)
GFR calc non Af Amer: >60 mL/min (ref >60)
GLUCOSE: 130 mg/dL — AB (ref 65–99)
Potassium: 2.9 mmol/L — ABNORMAL LOW (ref 3.5–5.1)
SODIUM: 128 mmol/L — AB (ref 135–145)
Total Bilirubin: 1.3 mg/dL — ABNORMAL HIGH (ref 0.3–1.2)
Total Protein: 6.2 g/dL — ABNORMAL LOW (ref 6.5–8.1)

## 2016-06-04 LAB — POTASSIUM: POTASSIUM: 3.3 mmol/L — AB (ref 3.5–5.1)

## 2016-06-04 LAB — GLUCOSE, CAPILLARY
Glucose-Capillary: 118 mg/dL — ABNORMAL HIGH (ref 65–99)
Glucose-Capillary: 121 mg/dL — ABNORMAL HIGH (ref 65–99)
Glucose-Capillary: 113 mg/dL — ABNORMAL HIGH (ref 65–99)

## 2016-06-04 LAB — PHOSPHORUS: PHOSPHORUS: 3 mg/dL (ref 2.5–4.6)

## 2016-06-04 MED ORDER — FAT EMULSION 20 % IV EMUL
250.0000 mL | INTRAVENOUS | Status: AC
  Administered 2016-06-04: 18:00:00 250 mL via INTRAVENOUS
  Filled 2016-06-04: qty 250

## 2016-06-04 MED ORDER — POTASSIUM CHLORIDE IN NACL 40-0.9 MEQ/L-% IV SOLN
INTRAVENOUS | Status: DC
  Administered 2016-06-04 – 2016-06-06 (×4): 75 mL/h via INTRAVENOUS
  Filled 2016-06-04 (×6): qty 1000

## 2016-06-04 MED ORDER — TRACE MINERALS CR-CU-MN-SE-ZN 10-1000-500-60 MCG/ML IV SOLN
INTRAVENOUS | Status: AC
  Administered 2016-06-04: 18:00:00 via INTRAVENOUS
  Filled 2016-06-04: qty 1560

## 2016-06-04 MED ORDER — SODIUM CHLORIDE 0.9 % IV SOLN
30.0000 meq | Freq: Once | INTRAVENOUS | Status: DC
  Filled 2016-06-04: qty 15

## 2016-06-04 MED ORDER — SODIUM CHLORIDE 0.9 % IV SOLN
30.0000 meq | Freq: Once | INTRAVENOUS | Status: AC
  Administered 2016-06-04: 12:00:00 30 meq via INTRAVENOUS
  Filled 2016-06-04: qty 15

## 2016-06-04 MED ORDER — MAGNESIUM SULFATE 4 GM/100ML IV SOLN
4.0000 g | Freq: Once | INTRAVENOUS | Status: AC
  Administered 2016-06-04: 4 g via INTRAVENOUS
  Filled 2016-06-04: qty 100

## 2016-06-04 NOTE — Progress Notes (Signed)
PHARMACY - ADULT TOTAL PARENTERAL NUTRITION CONSULT NOTE   Pharmacy Consult for TPN monitoring (electrolyte and BG) Indication: ileus  Patient Measurements: Height: 6\' 1"  (185.4 cm) Weight: 156 lb 6.4 oz (70.9 kg) IBW/kg (Calculated) : 79.9 TPN AdjBW (KG): 66.1 Body mass index is 20.63 kg/m.    Insulin requirements in the past 24 hours: 0  Lytes:  BMP Latest Ref Rng & Units 06/04/2016 06/04/2016 06/03/2016  Glucose 65 - 99 mg/dL - 960(A130(H) 540(J111(H)  BUN 6 - 20 mg/dL - 5(L) 7  Creatinine 8.110.61 - 1.24 mg/dL - 9.14(N0.43(L) 8.29(F0.46(L)  Sodium 135 - 145 mmol/L - 128(L) 130(L)  Potassium 3.5 - 5.1 mmol/L 3.3(L) 2.9(L) 3.4(L)  Chloride 101 - 111 mmol/L - 97(L) 99(L)  CO2 22 - 32 mmol/L - 25 24  Calcium 8.9 - 10.3 mg/dL - 8.0(L) 8.2(L)   Magnesium  Date Value Ref Range Status  06/04/2016 1.5 (L) 1.7 - 2.4 mg/dL Final   Phosphorus  Date Value Ref Range Status  06/04/2016 3.0 2.5 - 4.6 mg/dL Final      Best Practices:  TPN Access: Order for PICC line to be placed on 2/16 TPN start date: 2/16    Plan:  Patient received magnesium 4g IV  And potassium 30mEq IV x 1 on 2/17. Patient currently ordered NS/4520mEq Potassium at 5175mL/hr and potassium 30mEq IV x 1.   Will ordered additional magnesium 4g IV x 1. Will advance IV fluid regimen to NS/5340mEQ Potassium at 5075mL/hr. Will obtain follow up labs at 1300.   Will continue TPN Clinimix E 5/20 to 65 ml/hr and add lipids as per dietary recommendations (baseline TG wnl).  Continue SSI and blood glucose checks q6h.  Will continue to check daily labs for now.   2/18 1630 K=3.3, still low but improving on current regimen. Will follow up on AM labs.  Clovia CuffLisa Naelle Diegel, PharmD, BCPS 06/04/2016 6:18 PM

## 2016-06-04 NOTE — Progress Notes (Signed)
CC: Partial SBO w active DTs  Subjective: H and pulled his NG tube yesterday. Actually he is awake today as this is the first time that I had seen him awake and I have talked to him. He is is still very aggressive and with some biliary a cystocele. He did have a bowel movement NG tube 275 cc KUB personal review and there is evidence of air in the distal colon. There is no free air there is some bowel dilation. HE did spike a temperature His potassium is 2.9, albumin 2.5, sodium 128 and magnesium 1.5  Objective: Vital signs in last 24 hours: Temp:  [97.9 F (36.6 C)-101.2 F (38.4 C)] 98 F (36.7 C) (02/18 0915) Pulse Rate:  [98-130] 110 (02/18 0918) Resp:  [18] 18 (02/18 0201) BP: (138-164)/(78-98) 148/98 (02/18 0915) SpO2:  [98 %-100 %] 98 % (02/18 0445) Weight:  [70.9 kg (156 lb 6.4 oz)] 70.9 kg (156 lb 6.4 oz) (02/18 0500) Last BM Date: 06/03/16  Intake/Output from previous day: 02/17 0701 - 02/18 0700 In: 1360.5 [I.V.:955.3; NG/GT:40; IV Piggyback:365.2] Out: 275 [Emesis/NG output:275] Intake/Output this shift: Total I/O In: 50 [IV Piggyback:50] Out: -   Physical exam: NAD, anxious Chest: some rales bilaterally, NSR Abd: soft, NT, no peritonitis , mildly distended Ext: well perfused, no edema  Lab Results: CBC   Recent Labs  06/03/16 0610 06/04/16 0548  WBC 7.6 8.4  HGB 13.0 11.5*  HCT 37.7* 33.6*  PLT 174 176   BMET  Recent Labs  06/03/16 0610 06/04/16 0548  NA 130* 128*  K 3.4* 2.9*  CL 99* 97*  CO2 24 25  GLUCOSE 111* 130*  BUN 7 5*  CREATININE 0.46* 0.43*  CALCIUM 8.2* 8.0*   PT/INR  Recent Labs  06/03/16 0610 06/04/16 0548  LABPROT 15.9* 17.0*  INR 1.26 1.37   ABG  Recent Labs  06/02/16 1037  PHART 7.45  HCO3 25.0    Studies/Results: Dg Abd 1 View  Result Date: 06/03/2016 CLINICAL DATA:  NG tube placement EXAM: ABDOMEN - 1 VIEW COMPARISON:  06/03/2016; CT abdomen and pelvis - 06/02/2016 FINDINGS: Interval placement of  enteric tube with tip and side port project over the expected location of the gastric fundus. Worsening moderate gas distention of predominantly the small bowel with index loop of small bowel within the right mid hemiabdomen measuring approximately 4 cm in diameter. This finding is again associated with a conspicuous paucity of distal colonic gas remains concerning for worsening small bowel obstruction. No supine evidence of pneumoperitoneum. No pneumatosis or portal venous gas. No definitive abnormal intra-abdominal calcifications. IMPRESSION: 1. Enteric tube tip and side port projected the expected location of the gastric fundus. 2. Findings suggestive of worsening distal small bowel obstruction. Electronically Signed   By: Simonne Come M.D.   On: 06/03/2016 11:58   Ct Abdomen Pelvis W Contrast  Result Date: 06/02/2016 CLINICAL DATA:  Abdominal pain and emesis. EXAM: CT ABDOMEN AND PELVIS WITH CONTRAST TECHNIQUE: Multidetector CT imaging of the abdomen and pelvis was performed using the standard protocol following bolus administration of intravenous contrast. CONTRAST:  ISOVUE-300 IOPAMIDOL (ISOVUE-300) INJECTION 61% COMPARISON:  None. FINDINGS: Lower chest: Small bilateral pleural effusions with overlying atelectasis. The heart is normal in size. There is an NG tube coursing down the esophagus and into the stomach. Hepatobiliary: Diffuse and fairly marked fatty infiltration of liver above no focal hepatic lesions or intrahepatic biliary dilatation. The the gallbladder is normal. No common bile duct dilatation. Pancreas: No  mass, inflammation or ductal dilatation. Spleen: Normal size.  No focal lesions. Adrenals/Urinary Tract: The adrenal glands and kidneys are normal except for small cysts. The bladder is unremarkable. Stomach/Bowel: Markedly dilated small bowel loops with air fluid and air contrast levels consistent with small bowel obstruction. The colon is relatively decompressed. There are normal/  decompressed distal ileal small bowel loops but consistent with a distal small bowel obstruction likely due to adhesions. No mass or acute inflammatory process. The appendix is normal. Vascular/Lymphatic: The aorta branch vessels patent. The major venous structures are patent. Scattered aortic and iliac artery atherosclerotic calcifications. Small mesenteric and retroperitoneal lymph nodes but no mass or overt adenopathy. Reproductive: The prostate gland and seminal vesicles are unremarkable. Other: Small amount of free abdominal and free pelvic fluid due to the small bowel obstruction. No pelvic mass or adenopathy. No inguinal mass or adenopathy. No abdominal wall or inguinal hernia. Musculoskeletal: No significant bony findings. IMPRESSION: 1. CT findings consistent with a high-grade distal small bowel obstruction, likely due to adhesions. Associated free abdominal and free pelvic fluid. No findings for perforation. 2. Small bilateral pleural effusions with overlying atelectasis. 3. Diffuse fatty infiltration liver. Electronically Signed   By: Rudie Meyer M.D.   On: 06/02/2016 12:15   Dg Abd Acute W/chest  Result Date: 06/04/2016 CLINICAL DATA:  Small bowel obstruction EXAM: DG ABDOMEN ACUTE W/ 1V CHEST COMPARISON:  06/03/2016 FINDINGS: RIGHT arm PICC line with tip projecting over cavoatrial junction. Normal heart size, mediastinal contours, and pulmonary vascularity. Central peribronchial thickening with minimal bibasilar atelectasis. Lungs otherwise clear. No pleural effusion or pneumothorax. Persistent gaseous distention of small bowel loops consistent with small bowel obstruction. Largest small-bowel loop measures 5.4 cm diameter, increased. Question mild wall thickening of several small bowel loops in the RIGHT mid abdomen. No free intraperitoneal air. Osseous structures unremarkable. IMPRESSION: Increased small bowel distention since previous exam. Question mild wall thickening of small bowel loops in  the RIGHT mid abdomen. No evidence of perforation. Electronically Signed   By: Ulyses Southward M.D.   On: 06/04/2016 07:34   Dg Abd Acute W/chest  Result Date: 06/03/2016 CLINICAL DATA:  Followup small bowel obstruction. EXAM: DG ABDOMEN ACUTE W/ 1V CHEST COMPARISON:  CT, 06/02/2016 FINDINGS: Dilated loops of small bowel are evident consistent with the bowel obstruction noted on the previous day's CT. Air is also seen within a nondilated colon. No free air. Nasogastric tube passes below the diaphragm into the mid stomach. Cardiac silhouette is normal in size. No mediastinal or hilar masses. Clear lungs. Right PICC has its tip in the inferior vena cava just above the caval atrial junction. IMPRESSION: 1. Persistent small bowel obstruction, without significant change from the previous day's CT scan. 2. No free air. 3. No acute cardiopulmonary disease. Electronically Signed   By: Amie Portland M.D.   On: 06/03/2016 09:16    Anti-infectives: Anti-infectives    Start     Dose/Rate Route Frequency Ordered Stop   06/03/16 1400  piperacillin-tazobactam (ZOSYN) IVPB 3.375 g     3.375 g 12.5 mL/hr over 240 Minutes Intravenous Every 8 hours 06/03/16 1321        Assessment/Plan:  small bowel obstruction in a patient with active DTs and multiple risk factors including malnutrition with hypoalbuminemia, hyponatremia and ? pneumonia. Clinically improving and no surgical intervention required. Again I stated before he has poor prognosis currently and I do think that he is perioperative risk is prohibitive given all his risk factors. Fortunately he  is clinically improving and no intervention is required. We'll continue to follow him  Sterling Bigiego Yuritzi Kamp, MD, [facility]  06/04/2016

## 2016-06-04 NOTE — Progress Notes (Signed)
Pharmacy Antibiotic Note  Carlton AdamJoe T Aguillard III is a 56 y.o. male admitted on 05/24/2016 with Illeus, now being treated for  sepsis.  Pharmacy has been consulted for Zosyn  dosing.  Plan: Continue Zosyn EI 3.375 IV every 8 hours.   Height: 6\' 1"  (185.4 cm) Weight: 156 lb 6.4 oz (70.9 kg) IBW/kg (Calculated) : 79.9  Temp (24hrs), Avg:99.5 F (37.5 C), Min:97.9 F (36.6 C), Max:101.2 F (38.4 C)   Recent Labs Lab 05/30/16 0749 05/31/16 0529 06/02/16 0353 06/02/16 1051 06/03/16 0610 06/04/16 0548  WBC  --   --   --  5.9 7.6 8.4  CREATININE 0.53* 0.56* 0.45*  --  0.46* 0.43*    Estimated Creatinine Clearance: 105.9 mL/min (by C-G formula based on SCr of 0.43 mg/dL (L)).    Allergies  Allergen Reactions  . Milk-Related Compounds     Antimicrobials this admission: 2/17 Zosyn >>   Dose adjustments this admission:  Microbiology results: 2/17 BCx: no growth < 24 hours 2/9  MRSA NFA:OZHYQMVHPCR:Negative   Pharmacy will continue to monitor and adjust per consult.   Simpson,Michael L 06/04/2016 7:24 AM

## 2016-06-04 NOTE — Progress Notes (Signed)
Dr Nicole KindredPyredy made aware that pt potassium 2.9, states that he will place new order

## 2016-06-04 NOTE — Progress Notes (Signed)
PHARMACY - ADULT TOTAL PARENTERAL NUTRITION CONSULT NOTE   Pharmacy Consult for TPN monitoring (electrolyte and BG) Indication: ileus  Patient Measurements: Height: 6\' 1"  (185.4 cm) Weight: 156 lb 6.4 oz (70.9 kg) IBW/kg (Calculated) : 79.9 TPN AdjBW (KG): 66.1 Body mass index is 20.63 kg/m.    Insulin requirements in the past 24 hours: 0  Lytes:  BMP Latest Ref Rng & Units 06/04/2016 06/03/2016 06/02/2016  Glucose 65 - 99 mg/dL 161(W130(H) 960(A111(H) 540(J103(H)  BUN 6 - 20 mg/dL 5(L) 7 6  Creatinine 8.110.61 - 1.24 mg/dL 9.14(N0.43(L) 8.29(F0.46(L) 6.21(H0.45(L)  Sodium 135 - 145 mmol/L 128(L) 130(L) 134(L)  Potassium 3.5 - 5.1 mmol/L 2.9(L) 3.4(L) 3.4(L)  Chloride 101 - 111 mmol/L 97(L) 99(L) 104  CO2 22 - 32 mmol/L 25 24 23   Calcium 8.9 - 10.3 mg/dL 8.0(L) 8.2(L) 8.2(L)   Magnesium  Date Value Ref Range Status  06/04/2016 1.5 (L) 1.7 - 2.4 mg/dL Final   Phosphorus  Date Value Ref Range Status  06/04/2016 3.0 2.5 - 4.6 mg/dL Final      Best Practices:  TPN Access: Order for PICC line to be placed on 2/16 TPN start date: 2/16    Plan:  Patient received magnesium 4g IV  And potassium 30mEq IV x 1 on 2/17. Patient currently ordered NS/3520mEq Potassium at 5575mL/hr and potassium 30mEq IV x 1.   Will ordered additional magnesium 4g IV x 1. Will advance IV fluid regimen to NS/8340mEQ Potassium at 6775mL/hr. Will obtain follow up labs at 1300.   Will continue TPN Clinimix E 5/20 to 65 ml/hr and add lipids as per dietary recommendations (baseline TG wnl).  Continue SSI and blood glucose checks q6h.  Will continue to check daily labs for now.   Simpson,Michael L, PharmD Clinical Pharmacist 06/04/2016,7:16 AM

## 2016-06-04 NOTE — Progress Notes (Signed)
[facility] - [place] [facility]   PATIENT NAME: Willie Baker    MR#:  [id number]  DATE OF BIRTH:  02/22/1961  SUBJECTIVE:   Patient pulled out his NG tube twice yesterday. Had a bowel movement yesterday. Much more awake but not oriented today. Abdomen soft, no vomiting overnight. No fever overnight. Remains on TPN  REVIEW OF SYSTEMS:   Review of Systems  Unable to perform ROS: Mental acuity  Constitutional: Negative for fever.   Tolerating Diet: No  DRUG ALLERGIES:   Allergies  Allergen Reactions  . Milk-Related Compounds     VITALS:  Blood pressure (!) 148/98, pulse (!) 110, temperature 98 F (36.7 C), temperature source Oral, resp. rate 18, height 6\' 1"  (1.854 m), weight 70.9 kg (156 lb 6.4 oz), SpO2 98 %.  PHYSICAL EXAMINATION:   Physical Exam  GENERAL:  56 y.o.-year-old patient lying in  bed awake but does not follow commands.  EYES: Pupils equal, round, reactive to light. No scleral icterus.   HEENT: Head atraumatic, normocephalic. Oropharynx and nasopharynx clear. Dry Oral Mucosa.   NECK:  Supple, no jugular venous distention. No thyroid enlargement, no tenderness.  LUNGS: Normal breath sounds bilaterally, no wheezing, rales, upper airway rhonchi. No use of accessory muscles of respiration.  CARDIOVASCULAR: S1, S2 normal. No murmurs, rubs, or gallops. tachycardia ABDOMEN: Soft, nontender, non-distended, + BS. No organomegaly or mass.  EXTREMITIES: No cyanosis, clubbing or edema b/l.    NEUROLOGIC: Cranial nerves II through XII are intact. No focal Motor or sensory deficits b/l.  Encephalopathic PSYCHIATRIC:  patient is alert and oriented x 1.  Encephalopathic.  SKIN: No obvious rash, lesion, or ulcer.   LABORATORY PANEL:  CBC  Recent Labs Lab 06/04/16 0548  WBC 8.4  HGB 11.5*  HCT 33.6*  PLT 176    Chemistries   Recent Labs Lab 06/04/16 0548  NA 128*  K 2.9*  CL 97*  CO2 25  GLUCOSE 130*  BUN 5*  CREATININE 0.43*   CALCIUM 8.0*  MG 1.5*  AST 28  ALT 19  ALKPHOS 44  BILITOT 1.3*   Cardiac Enzymes No results for input(s): TROPONINI in the last 168 hours. RADIOLOGY:  Dg Abd 1 View  Result Date: 06/03/2016 CLINICAL DATA:  NG tube placement EXAM: ABDOMEN - 1 VIEW COMPARISON:  06/03/2016; CT abdomen and pelvis - 06/02/2016 FINDINGS: Interval placement of enteric tube with tip and side port project over the expected location of the gastric fundus. Worsening moderate gas distention of predominantly the small bowel with index loop of small bowel within the right mid hemiabdomen measuring approximately 4 cm in diameter. This finding is again associated with a conspicuous paucity of distal colonic gas remains concerning for worsening small bowel obstruction. No supine evidence of pneumoperitoneum. No pneumatosis or portal venous gas. No definitive abnormal intra-abdominal calcifications. IMPRESSION: 1. Enteric tube tip and side port projected the expected location of the gastric fundus. 2. Findings suggestive of worsening distal small bowel obstruction. Electronically Signed   By: Simonne ComeJohn  Watts M.D.   On: 06/03/2016 11:58   Ct Abdomen Pelvis W Contrast  Result Date: 06/02/2016 CLINICAL DATA:  Abdominal pain and emesis. EXAM: CT ABDOMEN AND PELVIS WITH CONTRAST TECHNIQUE: Multidetector CT imaging of the abdomen and pelvis was performed using the standard protocol following bolus administration of intravenous contrast. CONTRAST:  100mL ISOVUE-300 IOPAMIDOL (ISOVUE-300) INJECTION 61% COMPARISON:  None. FINDINGS: Lower chest: Small bilateral pleural effusions with overlying atelectasis. The heart is normal in  size. There is an NG tube coursing down the esophagus and into the stomach. Hepatobiliary: Diffuse and fairly marked fatty infiltration of liver above no focal hepatic lesions or intrahepatic biliary dilatation. The the gallbladder is normal. No common bile duct dilatation. Pancreas: No mass, inflammation or ductal  dilatation. Spleen: Normal size.  No focal lesions. Adrenals/Urinary Tract: The adrenal glands and kidneys are normal except for small cysts. The bladder is unremarkable. Stomach/Bowel: Markedly dilated small bowel loops with air fluid and air contrast levels consistent with small bowel obstruction. The colon is relatively decompressed. There are normal/ decompressed distal ileal small bowel loops but consistent with a distal small bowel obstruction likely due to adhesions. No mass or acute inflammatory process. The appendix is normal. Vascular/Lymphatic: The aorta branch vessels patent. The major venous structures are patent. Scattered aortic and iliac artery atherosclerotic calcifications. Small mesenteric and retroperitoneal lymph nodes but no mass or overt adenopathy. Reproductive: The prostate gland and seminal vesicles are unremarkable. Other: Small amount of free abdominal and free pelvic fluid due to the small bowel obstruction. No pelvic mass or adenopathy. No inguinal mass or adenopathy. No abdominal wall or inguinal hernia. Musculoskeletal: No significant bony findings. IMPRESSION: 1. CT findings consistent with a high-grade distal small bowel obstruction, likely due to adhesions. Associated free abdominal and free pelvic fluid. No findings for perforation. 2. Small bilateral pleural effusions with overlying atelectasis. 3. Diffuse fatty infiltration liver. Electronically Signed   By: Rudie Meyer M.D.   On: 06/02/2016 12:15   Dg Abd Acute W/chest  Result Date: 06/04/2016 CLINICAL DATA:  Small bowel obstruction EXAM: DG ABDOMEN ACUTE W/ 1V CHEST COMPARISON:  06/03/2016 FINDINGS: RIGHT arm PICC line with tip projecting over cavoatrial junction. Normal heart size, mediastinal contours, and pulmonary vascularity. Central peribronchial thickening with minimal bibasilar atelectasis. Lungs otherwise clear. No pleural effusion or pneumothorax. Persistent gaseous distention of small bowel loops consistent  with small bowel obstruction. Largest small-bowel loop measures 5.4 cm diameter, increased. Question mild wall thickening of several small bowel loops in the RIGHT mid abdomen. No free intraperitoneal air. Osseous structures unremarkable. IMPRESSION: Increased small bowel distention since previous exam. Question mild wall thickening of small bowel loops in the RIGHT mid abdomen. No evidence of perforation. Electronically Signed   By: Ulyses Southward M.D.   On: 06/04/2016 07:34   Dg Abd Acute W/chest  Result Date: 06/03/2016 CLINICAL DATA:  Followup small bowel obstruction. EXAM: DG ABDOMEN ACUTE W/ 1V CHEST COMPARISON:  CT, 06/02/2016 FINDINGS: Dilated loops of small bowel are evident consistent with the bowel obstruction noted on the previous day's CT. Air is also seen within a nondilated colon. No free air. Nasogastric tube passes below the diaphragm into the mid stomach. Cardiac silhouette is normal in size. No mediastinal or hilar masses. Clear lungs. Right PICC has its tip in the inferior vena cava just above the caval atrial junction. IMPRESSION: 1. Persistent small bowel obstruction, without significant change from the previous day's CT scan. 2. No free air. 3. No acute cardiopulmonary disease. Electronically Signed   By: Amie Portland M.D.   On: 06/03/2016 09:16   ASSESSMENT AND PLAN:   56 yo male with no past medical history presented to the ED with abdominal pain and non-bloody emesis. Admitted for persistent tachycardia.  1. SBO/Ileus - appreciate surgical input.  Conservative care as he is high operative risk given his DT's.  - improved w/ conservative management with NG tube decompression, IV fluids.  NG tube pulled out  by pt. Twice yesterday.  - had a BM yesterday and abdomen is soft with bowel sounds.  - cont. Supportive care and no plans for surgical intervention.    2. Alcohol abuse/withdrawal- off Precedex gtt now and will cont. CIWA.   3. Hyponatremia -due to alcohol abuse - cont.  TPN for now and follow.  4. Hypokalemia/Hypomagnesemia - cont. To supplement with TPN and replace lytes accordingly.   5. Nutrition - due to SBO/Ilues now will hold off on enteral feedings.  - currently on TPN but ileus/SBO improving and can consider enteral feeds if continues to improve in next 1-2 days.  6. Fever of unknown origin - source unclear.  Tylenol given.  Remains afebrile overnight.  - UA was (-), CXR (-).  Blood cultures (-) so far. Cont. Zosyn and can d/c abx in no fever in 24-48 hrs.   Pt's prognosis is poor.   Case discussed with Care Management/Social Worker. Management plans discussed with the patient, family and they are in agreement.  CODE STATUS: full  DVT Prophylaxis: Ted's & SCD's  TOTAL TIME TAKING CARE OF THIS PATIENT: 30 minutes.    Note: This dictation was prepared with Dragon dictation along with smaller phrase technology. Any transcriptional errors that result from this process are unintentional.  Houston Siren M.D on 06/04/2016 at 10:58 AM  Between 7am to 6pm - Pager - [phone]  After 6pm go to [email or web address] - Social research officer, government  [facility]  Office  [phone]  CC: Primary care physician; No PCP Per Patient

## 2016-06-05 ENCOUNTER — Inpatient Hospital Stay: Payer: Medicaid Other

## 2016-06-05 DIAGNOSIS — R109 Unspecified abdominal pain: Secondary | ICD-10-CM

## 2016-06-05 DIAGNOSIS — K56609 Unspecified intestinal obstruction, unspecified as to partial versus complete obstruction: Secondary | ICD-10-CM

## 2016-06-05 DIAGNOSIS — E871 Hypo-osmolality and hyponatremia: Secondary | ICD-10-CM

## 2016-06-05 LAB — GLUCOSE, CAPILLARY
Glucose-Capillary: 101 mg/dL — ABNORMAL HIGH (ref 65–99)
Glucose-Capillary: 107 mg/dL — ABNORMAL HIGH (ref 65–99)
Glucose-Capillary: 111 mg/dL — ABNORMAL HIGH (ref 65–99)
Glucose-Capillary: 118 mg/dL — ABNORMAL HIGH (ref 65–99)
Glucose-Capillary: 119 mg/dL — ABNORMAL HIGH (ref 65–99)

## 2016-06-05 LAB — BASIC METABOLIC PANEL
ANION GAP: 6 (ref 5–15)
BUN: 8 mg/dL (ref 6–20)
CHLORIDE: 98 mmol/L — AB (ref 101–111)
CO2: 24 mmol/L (ref 22–32)
Calcium: 7.9 mg/dL — ABNORMAL LOW (ref 8.9–10.3)
Creatinine, Ser: 0.44 mg/dL — ABNORMAL LOW (ref 0.61–1.24)
Glucose, Bld: 127 mg/dL — ABNORMAL HIGH (ref 65–99)
POTASSIUM: 3.5 mmol/L (ref 3.5–5.1)
SODIUM: 128 mmol/L — AB (ref 135–145)

## 2016-06-05 LAB — CBC
HCT: 34.9 % — ABNORMAL LOW (ref 40.0–52.0)
HEMOGLOBIN: 11.9 g/dL — AB (ref 13.0–18.0)
MCH: 32.9 pg (ref 26.0–34.0)
MCHC: 34.2 g/dL (ref 32.0–36.0)
MCV: 96.2 fL (ref 80.0–100.0)
PLATELETS: 181 10*3/uL (ref 150–440)
RBC: 3.62 MIL/uL — AB (ref 4.40–5.90)
RDW: 13 % (ref 11.5–14.5)
WBC: 10.3 10*3/uL (ref 3.8–10.6)

## 2016-06-05 LAB — DIFFERENTIAL
Basophils Absolute: 0.1 10*3/uL (ref 0–0.1)
Basophils Relative: 1 %
Eosinophils Absolute: 0 10*3/uL (ref 0–0.7)
Eosinophils Relative: 0 %
LYMPHS ABS: 2.2 10*3/uL (ref 1.0–3.6)
LYMPHS PCT: 21 %
Monocytes Absolute: 1.1 10*3/uL — ABNORMAL HIGH (ref 0.2–1.0)
Monocytes Relative: 11 %
NEUTROS ABS: 6.9 10*3/uL — AB (ref 1.4–6.5)
NEUTROS PCT: 67 %

## 2016-06-05 LAB — MAGNESIUM
MAGNESIUM: 1.5 mg/dL — AB (ref 1.7–2.4)
MAGNESIUM: 1.8 mg/dL (ref 1.7–2.4)

## 2016-06-05 LAB — PHOSPHORUS: PHOSPHORUS: 3.3 mg/dL (ref 2.5–4.6)

## 2016-06-05 LAB — TRIGLYCERIDES: TRIGLYCERIDES: 75 mg/dL (ref <150)

## 2016-06-05 MED ORDER — TRACE MINERALS CR-CU-MN-SE-ZN 10-1000-500-60 MCG/ML IV SOLN
INTRAVENOUS | Status: DC
  Administered 2016-06-05: 18:00:00 via INTRAVENOUS
  Filled 2016-06-05: qty 1560

## 2016-06-05 MED ORDER — MAGNESIUM SULFATE IN D5W 1-5 GM/100ML-% IV SOLN
1.0000 g | Freq: Once | INTRAVENOUS | Status: AC
  Administered 2016-06-05: 1 g via INTRAVENOUS
  Filled 2016-06-05: qty 100

## 2016-06-05 MED ORDER — MAGNESIUM SULFATE 4 GM/100ML IV SOLN
4.0000 g | Freq: Once | INTRAVENOUS | Status: AC
  Administered 2016-06-05: 09:00:00 4 g via INTRAVENOUS
  Filled 2016-06-05: qty 100

## 2016-06-05 MED ORDER — FAT EMULSION 20 % IV EMUL
250.0000 mL | INTRAVENOUS | Status: AC
  Administered 2016-06-05: 18:00:00 250 mL via INTRAVENOUS
  Filled 2016-06-05: qty 250

## 2016-06-05 NOTE — Progress Notes (Signed)
Pt with episode of agitation during personal care, cursing NTs and hitting toward them, PRN ativan given

## 2016-06-05 NOTE — Progress Notes (Signed)
Pharmacy electrolyte monitoring note:  2/19: Mg = 1.8 (WNL) this evening after repletion. Will recheck electrolytes with AM labs tomorrow.  Cindi CarbonMary M Thai Burgueno, PharmD, BCPS 06/05/16 6:56 PM

## 2016-06-05 NOTE — Progress Notes (Signed)
Nutrition Follow-up  DOCUMENTATION CODES:   Non-severe (moderate) malnutrition in context of chronic illness  INTERVENTION:  Continue Clinimix E 5/20 @ 65 ml/hr and 20% ILE at 20 ml/hr over 12 hours.  Goal regimen provides 1853 kcal, 78 grams of protein, 1800 ml fluid daily.  Diet advancement per Surgery and Attending. Will monitor diet advancement and status of SBO/ileus.  NUTRITION DIAGNOSIS:   Malnutrition (Moderate) related to chronic illness, poor appetite (abdominal pain, NV) as evidenced by energy intake < 75% for > or equal to 1 month, moderate depletions of muscle mass, moderate depletion of body fat.  Ongoing.  GOAL:   Patient will meet greater than or equal to 90% of their needs  Met with TPN.   MONITOR:   PO intake, Supplement acceptance, Labs, I & O's, Weight trends  REASON FOR ASSESSMENT:   Malnutrition Screening Tool    ASSESSMENT:   56 year old male with no PMHx presented with abdominal pain and non-bloody emesis admitted for persistent tachycardia.   -Patient was advanced to CLD today after assessment.  Attempted to speak with patient at bedside but he was sleeping. Per chart patient has been pulling out NG tube and does not currently have one in place. Patient is not vomiting. Had BM yesterday. Per MD note on 2/18 can consider enteral feeds if ileus/SBO continues to improve in next 1-2 days.  TPN: Patient advanced to goal TPN regimen on 2/17 of Clinimix E 5/20 @ 65 ml/hr and 20% ILE at 20 ml/hr over 12 hours.  Medications reviewed and include: Colace, folic acid 1 mg daily, Novolog sliding scale Q6hrs (received 1 unit past 24 hrs), pantoprazole, Zosyn, thiamine 100 mg daily, NS with KCl 40 mEq/L @ 75 ml/hr, Ativan PRN.  Labs reviewed: CBG 101-118 past 24 hrs, Sodium 128, Chloride 98, Creatinine 0.44, Magnesium 1.5. Potassium and Phosphorus WNL.  Urine output unmeasured.  Weight trend: 71.2 kg on 2/19 (+1.4 kg from when TPN initiated 2/16)  Diet  Order:  .TPN (CLINIMIX-E) Adult Diet clear liquid Room service appropriate? Yes; Fluid consistency: Thin TPN (CLINIMIX-E) Adult  Skin:  Reviewed, no issues  Last BM:  06/04/2016  Height:   Ht Readings from Last 1 Encounters:  05/26/16 6' 1"  (1.854 m)    Weight:   Wt Readings from Last 1 Encounters:  06/05/16 157 lb (71.2 kg)    Ideal Body Weight:  83.6 kg  BMI:  Body mass index is 20.71 kg/m.  Estimated Nutritional Needs:   Kcal:  1900-2065 (MSJ x 1.2-1.3)  Protein:  70-83 grams (1-1.2 grams/kg)  Fluid:  2 L/day (30 ml/kg)  EDUCATION NEEDS:   No education needs identified at this time  Willey Blade, MS, RD, LDN Pager: [phone] After Hours Pager: [phone]

## 2016-06-05 NOTE — Progress Notes (Signed)
PHARMACY - ADULT TOTAL PARENTERAL NUTRITION CONSULT NOTE   Pharmacy Consult for TPN monitoring (electrolyte and BG) Indication: ileus- Partial SBO w active DTs  Patient Measurements: Height: 6\' 1"  (185.4 cm) Weight: 157 lb (71.2 kg) IBW/kg (Calculated) : 79.9 TPN AdjBW (KG): 66.1 Body mass index is 20.71 kg/m.    Insulin requirements in the past 24 hours: 0  Lytes:  BMP Latest Ref Rng & Units 06/05/2016 06/04/2016 06/04/2016  Glucose 65 - 99 mg/dL 161(W127(H) - 960(A130(H)  BUN 6 - 20 mg/dL 8 - 5(L)  Creatinine 5.400.61 - 1.24 mg/dL 9.81(X0.44(L) - 9.14(N0.43(L)  Sodium 135 - 145 mmol/L 128(L) - 128(L)  Potassium 3.5 - 5.1 mmol/L 3.5 3.3(L) 2.9(L)  Chloride 101 - 111 mmol/L 98(L) - 97(L)  CO2 22 - 32 mmol/L 24 - 25  Calcium 8.9 - 10.3 mg/dL 7.9(L) - 8.0(L)   Magnesium  Date Value Ref Range Status  06/05/2016 1.5 (L) 1.7 - 2.4 mg/dL Final   Phosphorus  Date Value Ref Range Status  06/05/2016 3.3 2.5 - 4.6 mg/dL Final      Best Practices:  TPN Access: Order for PICC line to be placed on 2/16 TPN start date: 2/16    Plan:   Will continue TPN Clinimix E 5/20 to 65 ml/hr and plan to add lipids as per dietary recommendations (TG wnl). Patient NPO.  IV fluid regimen: NS/6040mEQ Potassium/Liter at 6575mL/hr  Mag= 1.5. Will order magnesium 4g IV x 1. Will obtain follow up Mag at 1800.  Will continue to check daily labs for now.  Continue SSI and blood glucose checks q6h.  SSI /24hrs= 1 unit.   Bari MantisKristin Versia Mignogna PharmD Clinical Pharmacist 06/05/2016

## 2016-06-05 NOTE — Progress Notes (Signed)
PHARMACY - ADULT TOTAL PARENTERAL NUTRITION CONSULT NOTE   Pharmacy Consult for TPN monitoring (electrolyte and BG) Indication: ileus- Partial SBO w active DTs  Patient Measurements: Height: 6\' 1"  (185.4 cm) Weight: 157 lb (71.2 kg) IBW/kg (Calculated) : 79.9 TPN AdjBW (KG): 66.1 Body mass index is 20.71 kg/m.    Insulin requirements in the past 24 hours: 0  Lytes:  BMP Latest Ref Rng & Units 06/05/2016 06/04/2016 06/04/2016  Glucose 65 - 99 mg/dL 409(W127(H) - 119(J130(H)  BUN 6 - 20 mg/dL 8 - 5(L)  Creatinine 4.780.61 - 1.24 mg/dL 2.95(A0.44(L) - 2.13(Y0.43(L)  Sodium 135 - 145 mmol/L 128(L) - 128(L)  Potassium 3.5 - 5.1 mmol/L 3.5 3.3(L) 2.9(L)  Chloride 101 - 111 mmol/L 98(L) - 97(L)  CO2 22 - 32 mmol/L 24 - 25  Calcium 8.9 - 10.3 mg/dL 7.9(L) - 8.0(L)   Last Labs       Magnesium  Date Value Ref Range Status  06/05/2016 1.5 (L) 1.7 - 2.4 mg/dL Final     Last Labs  Phosphorus  Date Value Ref Range Status  06/05/2016 3.3 2.5 - 4.6 mg/dL Final        Best Practices:  TPN Access: Order for PICC line to be placed on 2/16 TPN start date: 2/16    Plan:   Will continue TPN Clinimix E 5/20 to 65 ml/hr and plan to add lipids as per dietary recommendations (TG wnl). Patient NPO.  IV fluid regimen: NS/3440mEQ Potassium/Liter at 6175mL/hr  Mag= 1.5. Will order magnesium 4g IV x 1. Will obtain follow up Mag at 1800.  Will continue to check daily labs for now.  2/19 @ 1900 Mg 1.8 - will give another Mg 1g IV x 1 and recheck Mg w/ am labs 2/20 considering patient is an EtOH abuser.   Continue SSI and blood glucose checks q6h.  SSI /24hrs= 1 unit.  Thomasene Rippleavid Dalayah Deahl, PharmD, BCPS Clinical Pharmacist 06/05/2016

## 2016-06-05 NOTE — Progress Notes (Signed)
CC: Partial small bowel obstruction Subjective: Patient came in in DTs and side showed signs of partial small bowel obstruction. Today his NG tube had been pulled accidentally. He is to meter is difficult to assess as he answers some questions but is either reluctant or angry to converse with me at this time. It is difficult to assess and impossible to obtain a review of systems from this patient. 81 has no abdominal pain.  Objective: Vital signs in last 24 hours: Temp:  [96.8 F (36 C)-98.6 F (37 C)] 97.6 F (36.4 C) (02/19 0351) Pulse Rate:  [110-128] 122 (02/19 0351) Resp:  [20] 20 (02/19 0351) BP: (121-152)/(79-98) 130/82 (02/19 0351) SpO2:  [92 %-100 %] 100 % (02/19 0351) Weight:  [157 lb (71.2 kg)] 157 lb (71.2 kg) (02/19 0500) Last BM Date: 06/04/16  Intake/Output from previous day: 02/18 0701 - 02/19 0700 In: 1658.3 [I.V.:1325.3; IV Piggyback:333] Out: -  Intake/Output this shift: No intake/output data recorded.  Physical exam:  Awake alert not clear if he is oriented Vital signs are stable Abdomen soft nondistended nontympanitic and nontender Calves are nontender  Lab Results: CBC   Recent Labs  06/04/16 0548 06/05/16 0436  WBC 8.4 10.3  HGB 11.5* 11.9*  HCT 33.6* 34.9*  PLT 176 181   BMET  Recent Labs  06/04/16 0548 06/04/16 1633 06/05/16 0436  NA 128*  --  128*  K 2.9* 3.3* 3.5  CL 97*  --  98*  CO2 25  --  24  GLUCOSE 130*  --  127*  BUN 5*  --  8  CREATININE 0.43*  --  0.44*  CALCIUM 8.0*  --  7.9*   PT/INR  Recent Labs  06/03/16 0610 06/04/16 0548  LABPROT 15.9* 17.0*  INR 1.26 1.37   ABG  Recent Labs  06/02/16 1037  PHART 7.45  HCO3 25.0    Studies/Results: Dg Abd 1 View  Result Date: 06/03/2016 CLINICAL DATA:  NG tube placement EXAM: ABDOMEN - 1 VIEW COMPARISON:  06/03/2016; CT abdomen and pelvis - 06/02/2016 FINDINGS: Interval placement of enteric tube with tip and side port project over the expected location of the  gastric fundus. Worsening moderate gas distention of predominantly the small bowel with index loop of small bowel within the right mid hemiabdomen measuring approximately 4 cm in diameter. This finding is again associated with a conspicuous paucity of distal colonic gas remains concerning for worsening small bowel obstruction. No supine evidence of pneumoperitoneum. No pneumatosis or portal venous gas. No definitive abnormal intra-abdominal calcifications. IMPRESSION: 1. Enteric tube tip and side port projected the expected location of the gastric fundus. 2. Findings suggestive of worsening distal small bowel obstruction. Electronically Signed   By: Simonne Come M.D.   On: 06/03/2016 11:58   Dg Abd Acute W/chest  Result Date: 06/04/2016 CLINICAL DATA:  Small bowel obstruction EXAM: DG ABDOMEN ACUTE W/ 1V CHEST COMPARISON:  06/03/2016 FINDINGS: RIGHT arm PICC line with tip projecting over cavoatrial junction. Normal heart size, mediastinal contours, and pulmonary vascularity. Central peribronchial thickening with minimal bibasilar atelectasis. Lungs otherwise clear. No pleural effusion or pneumothorax. Persistent gaseous distention of small bowel loops consistent with small bowel obstruction. Largest small-bowel loop measures 5.4 cm diameter, increased. Question mild wall thickening of several small bowel loops in the RIGHT mid abdomen. No free intraperitoneal air. Osseous structures unremarkable. IMPRESSION: Increased small bowel distention since previous exam. Question mild wall thickening of small bowel loops in the RIGHT mid abdomen. No evidence of  perforation. Electronically Signed   By: Ulyses SouthwardMark  Boles M.D.   On: 06/04/2016 07:34   Dg Abd Acute W/chest  Result Date: 06/03/2016 CLINICAL DATA:  Followup small bowel obstruction. EXAM: DG ABDOMEN ACUTE W/ 1V CHEST COMPARISON:  CT, 06/02/2016 FINDINGS: Dilated loops of small bowel are evident consistent with the bowel obstruction noted on the previous day's CT.  Air is also seen within a nondilated colon. No free air. Nasogastric tube passes below the diaphragm into the mid stomach. Cardiac silhouette is normal in size. No mediastinal or hilar masses. Clear lungs. Right PICC has its tip in the inferior vena cava just above the caval atrial junction. IMPRESSION: 1. Persistent small bowel obstruction, without significant change from the previous day's CT scan. 2. No free air. 3. No acute cardiopulmonary disease. Electronically Signed   By: Amie Portlandavid  Ormond M.D.   On: 06/03/2016 09:16    Anti-infectives: Anti-infectives    Start     Dose/Rate Route Frequency Ordered Stop   06/03/16 1400  piperacillin-tazobactam (ZOSYN) IVPB 3.375 g     3.375 g 12.5 mL/hr over 240 Minutes Intravenous Every 8 hours 06/03/16 1321        Assessment/Plan:  Electrolytes are still abnormal. Patient's demeanor and affect difficult to assess as he does not answer questions except reluctantly. It is not clear to me if he is confused or or oriented etc. I see no sign of bowel obstruction on physical exam and he has not been vomiting but I will check a KUB this morning.  Lattie Hawichard E Cooper, MD, FACS  06/05/2016

## 2016-06-05 NOTE — Progress Notes (Signed)
[facility] - [place] at [facility]   PATIENT NAME: Willie Baker    MR#:  [id number]  DATE OF BIRTH:  01/17/1961  SUBJECTIVE:  Sister at bedside. patient had a bowel movement yesterday. Much more awake but not oriented today. Abdomen soft, no vomiting overnight. No fever overnight. Remains on TPN, wants to eat REVIEW OF SYSTEMS:   Review of Systems  Constitutional: Negative for chills, fever and weight loss.  HENT: Negative for nosebleeds and sore throat.   Eyes: Negative for blurred vision.  Respiratory: Negative for cough, shortness of breath and wheezing.   Cardiovascular: Negative for chest pain, orthopnea, leg swelling and PND.  Gastrointestinal: Negative for abdominal pain, constipation, diarrhea, heartburn, nausea and vomiting.  Genitourinary: Negative for dysuria and urgency.  Musculoskeletal: Negative for back pain.  Skin: Negative for rash.  Neurological: Negative for dizziness, speech change, focal weakness and headaches.  Endo/Heme/Allergies: Does not bruise/bleed easily.  Psychiatric/Behavioral: Negative for depression.   Tolerating Diet: No  DRUG ALLERGIES:   Allergies  Allergen Reactions  . Milk-Related Compounds     VITALS:  Blood pressure (!) 137/91, pulse (!) 110, temperature 98.9 F (37.2 C), temperature source Axillary, resp. rate 20, height 6\' 1"  (1.854 m), weight 71.2 kg (157 lb), SpO2 97 %.  PHYSICAL EXAMINATION:   Physical Exam  GENERAL:  56 y.o.-year-old patient lying in  bed awake .  EYES: Pupils equal, round, reactive to light. No scleral icterus.   HEENT: Head atraumatic, normocephalic. Oropharynx and nasopharynx clear. Dry Oral Mucosa.   NECK:  Supple, no jugular venous distention. No thyroid enlargement, no tenderness.  LUNGS: Normal breath sounds bilaterally, no wheezing, rales, upper airway rhonchi. No use of accessory muscles of respiration.  CARDIOVASCULAR: S1, S2 normal. No murmurs, rubs, or gallops.  tachycardia ABDOMEN: Soft, nontender, non-distended, + BS. No organomegaly or mass.  EXTREMITIES: No cyanosis, clubbing or edema b/l.    NEUROLOGIC: Cranial nerves II through XII are intact. No focal Motor or sensory deficits b/l.  Encephalopathic PSYCHIATRIC:  patient is alert and oriented x 3 SKIN: No obvious rash, lesion, or ulcer.  LABORATORY PANEL:  CBC  Recent Labs Lab 06/05/16 0436  WBC 10.3  HGB 11.9*  HCT 34.9*  PLT 181    Chemistries   Recent Labs Lab 06/04/16 0548  06/05/16 0436  NA 128*  --  128*  K 2.9*  < > 3.5  CL 97*  --  98*  CO2 25  --  24  GLUCOSE 130*  --  127*  BUN 5*  --  8  CREATININE 0.43*  --  0.44*  CALCIUM 8.0*  --  7.9*  MG 1.5*  --  1.5*  AST 28  --   --   ALT 19  --   --   ALKPHOS 44  --   --   BILITOT 1.3*  --   --   < > = values in this interval not displayed. Cardiac Enzymes No results for input(s): TROPONINI in the last 168 hours. RADIOLOGY:  Dg Abd 2 Views  Result Date: 06/05/2016 CLINICAL DATA:  Abdominal pain and distention of unknown etiology EXAM: ABDOMEN - 2 VIEW COMPARISON:  Abdominal series of April 03, 2017. FINDINGS: There remain loops of moderately distended gas-filled small bowel in the upper and mid abdomen predominantly on the left. The volume of gas present may have slightly decreased. The maximal diameter of the knees distended small bowel loop is 5.3 cm which has decreased minimally  since the previous study. There is some gas within the colon especially the left colon and rectum. A small amount of contrast remains in the rectum. There are no abnormal soft tissue calcifications. The bony structures exhibit no acute abnormalities. IMPRESSION: Persistent partial mid to distal small bowel obstruction with slight overall decrease in the volume of gas within distended small bowel loops. No evidence of perforation. Electronically Signed   By: David  [place]az[place] M.D.   On: 06/05/2016 10:03   Dg Abd Acute W/chest  Result Date:  06/04/2016 CLINICAL DATA:  Small bowel obstruction EXAM: DG ABDOMEN ACUTE W/ 1V CHEST COMPARISON:  06/03/2016 FINDINGS: RIGHT arm PICC line with tip projecting over cavoatrial junction. Normal heart size, mediastinal contours, and pulmonary vascularity. Central peribronchial thickening with minimal bibasilar atelectasis. Lungs otherwise clear. No pleural effusion or pneumothorax. Persistent gaseous distention of small bowel loops consistent with small bowel obstruction. Largest small-bowel loop measures 5.4 cm diameter, increased. Question mild wall thickening of several small bowel loops in the RIGHT mid abdomen. No free intraperitoneal air. Osseous structures unremarkable. IMPRESSION: Increased small bowel distention since previous exam. Question mild wall thickening of small bowel loops in the RIGHT mid abdomen. No evidence of perforation. Electronically Signed   By: Ulyses Southward M.D.   On: 06/04/2016 07:34   ASSESSMENT AND PLAN:  56 yo male with no past medical history presented to the ED with abdominal pain and non-bloody emesis. Admitted for persistent tachycardia.  1. SBO/Ileus - appreciate surgical input.  -NG tube removed yesterday - had a BM yesterday and abdomen is soft with bowel sounds.  - cont. Supportive care and no plans for surgical intervention.  -Discussed with Dr. Excell Seltzer from surgery and he is okay with starting him on clear liquid diet today  2. Alcohol abuse/withdrawal- Cont. CIWA.   3. Hyponatremia -due to alcohol abuse - cont. TPN for now and follow. -Starting clear liquid diet today  4. Hypokalemia/Hypomagnesemia - cont. To supplement with TPN and replace lytes accordingly.   5. Nutrition - due to SBO/Ilues now will hold off on enteral feedings.  - currently on TPN  -Starting clear liquid diet today  6. Fever of unknown origin - source unclear.  Tylenol given.  Remains afebrile overnight.  - UA was (-), CXR (-).  Blood cultures (-) so far. Cont. Zosyn and can d/c abx  in no fever in 24-48 hrs.   Pt's prognosis is poor.   Case discussed with Care Management/Social Worker. Management plans discussed with the patient, family, Dr. Excell Seltzer and they are in agreement.  CODE STATUS: full  DVT Prophylaxis: Ted's & SCD's  TOTAL TIME TAKING CARE OF THIS PATIENT: 30 minutes.    Note: This dictation was prepared with Dragon dictation along with smaller phrase technology. Any transcriptional errors that result from this process are unintentional.  Delfino Lovett M.D on 06/05/2016 at 3:35 PM  Between 7am to 6pm - Pager - [phone]  After 6pm go to [email or web address] - Social research officer, government  [facility]  Office  [phone]  CC: Primary care physician; No PCP Per Patient

## 2016-06-06 LAB — BASIC METABOLIC PANEL
Anion gap: 5 (ref 5–15)
BUN: 8 mg/dL (ref 6–20)
CO2: 19 mmol/L — ABNORMAL LOW (ref 22–32)
CREATININE: 0.36 mg/dL — AB (ref 0.61–1.24)
Calcium: 7 mg/dL — ABNORMAL LOW (ref 8.9–10.3)
Chloride: 102 mmol/L (ref 101–111)
GFR calc Af Amer: >60 mL/min (ref >60)
Glucose, Bld: 96 mg/dL (ref 65–99)
Potassium: 7 mmol/L (ref 3.5–5.1)
SODIUM: 126 mmol/L — AB (ref 135–145)

## 2016-06-06 LAB — PHOSPHORUS: Phosphorus: 2.6 mg/dL (ref 2.5–4.6)

## 2016-06-06 LAB — GLUCOSE, CAPILLARY
Glucose-Capillary: 124 mg/dL — ABNORMAL HIGH (ref 65–99)
Glucose-Capillary: 96 mg/dL (ref 65–99)
Glucose-Capillary: 85 mg/dL (ref 65–99)
Glucose-Capillary: 94 mg/dL (ref 65–99)

## 2016-06-06 LAB — POTASSIUM: Potassium: 4.2 mmol/L (ref 3.5–5.1)

## 2016-06-06 LAB — MAGNESIUM: MAGNESIUM: 1.3 mg/dL — AB (ref 1.7–2.4)

## 2016-06-06 MED ORDER — MAGNESIUM SULFATE 2 GM/50ML IV SOLN
2.0000 g | Freq: Once | INTRAVENOUS | Status: AC
  Administered 2016-06-06: 2 g via INTRAVENOUS
  Filled 2016-06-06: qty 50

## 2016-06-06 MED ORDER — SODIUM CHLORIDE 0.9 % IV SOLN
INTRAVENOUS | Status: DC
Start: 2016-06-06 — End: 2016-06-13
  Administered 2016-06-06 – 2016-06-13 (×10): via INTRAVENOUS

## 2016-06-06 MED ORDER — INSULIN ASPART 100 UNIT/ML ~~LOC~~ SOLN: 0.0000 [IU] | Freq: Three times a day (TID) | SUBCUTANEOUS | Status: DC

## 2016-06-06 MED ORDER — FOLIC ACID 1 MG PO TABS
1.0000 mg | ORAL_TABLET | Freq: Every day | ORAL | Status: DC
  Administered 2016-06-07 – 2016-06-08 (×2): 1 mg via ORAL
  Filled 2016-06-06 (×3): qty 1

## 2016-06-06 MED ORDER — PANTOPRAZOLE SODIUM 40 MG PO TBEC
40.0000 mg | DELAYED_RELEASE_TABLET | Freq: Every day | ORAL | Status: DC
  Administered 2016-06-07 – 2016-06-18 (×8): 40 mg via ORAL
  Filled 2016-06-06 (×12): qty 1

## 2016-06-06 NOTE — Progress Notes (Signed)
Dr. Nemiah CommanderKalisetti paged re: IVF, continue w/ added K or d/c.  Dr. Nemiah CommanderKalisetti d/c'ed IVF w/ NS w/ 40mEq K, ordered NS @ 75.

## 2016-06-06 NOTE — Progress Notes (Signed)
CC: Abdominal pain Subjective: Patient's apparent ileus versus bowel obstruction with abdominal pain has completely resolved he has no pain at this time is had no nausea or vomiting and is tolerating a diet.  Objective: Vital signs in last 24 hours: Temp:  [98 F (36.7 C)-98.4 F (36.9 C)] 98.1 F (36.7 C) (02/20 0953) Pulse Rate:  [82-145] 82 (02/20 0953) Resp:  [18-20] 18 (02/20 0953) BP: (109-147)/(67-93) 138/88 (02/20 0953) SpO2:  [98 %-100 %] 99 % (02/20 0953) Weight:  [153 lb 4.8 oz (69.5 kg)] 153 lb 4.8 oz (69.5 kg) (02/20 0500) Last BM Date: 06/04/16  Intake/Output from previous day: 02/19 0701 - 02/20 0700 In: 3519.7 [I.V.:3269.5; IV Piggyback:250.2] Out: -  Intake/Output this shift: Total I/O In: 798.8 [I.V.:798.8] Out: -   Physical exam:  Soft nondistended nontympanitic and nontender abdomen  Lab Results: CBC   Recent Labs  06/04/16 0548 06/05/16 0436  WBC 8.4 10.3  HGB 11.5* 11.9*  HCT 33.6* 34.9*  PLT 176 181   BMET  Recent Labs  06/05/16 0436 06/06/16 1023 06/06/16 1543  NA 128* 126*  --   K 3.5 7.0* 4.2  CL 98* 102  --   CO2 24 19*  --   GLUCOSE 127* 96  --   BUN 8 8  --   CREATININE 0.44* 0.36*  --   CALCIUM 7.9* 7.0*  --    PT/INR  Recent Labs  06/04/16 0548  LABPROT 17.0*  INR 1.37   ABG No results for input(s): PHART, HCO3 in the last 72 hours.  Invalid input(s): PCO2, PO2  Studies/Results: Dg Abd 2 Views  Result Date: 06/05/2016 CLINICAL DATA:  Abdominal pain and distention of unknown etiology EXAM: ABDOMEN - 2 VIEW COMPARISON:  Abdominal series of April 03, 2017. FINDINGS: There remain loops of moderately distended gas-filled small bowel in the upper and mid abdomen predominantly on the left. The volume of gas present may have slightly decreased. The maximal diameter of the knees distended small bowel loop is 5.3 cm which has decreased minimally since the previous study. There is some gas within the colon especially the  left colon and rectum. A small amount of contrast remains in the rectum. There are no abnormal soft tissue calcifications. The bony structures exhibit no acute abnormalities. IMPRESSION: Persistent partial mid to distal small bowel obstruction with slight overall decrease in the volume of gas within distended small bowel loops. No evidence of perforation. Electronically Signed   By: David  SwazilandJordan M.D.   On: 06/05/2016 10:03    Anti-infectives: Anti-infectives    Start     Dose/Rate Route Frequency Ordered Stop   06/03/16 1400  piperacillin-tazobactam (ZOSYN) IVPB 3.375 g  Status:  Discontinued     3.375 g 12.5 mL/hr over 240 Minutes Intravenous Every 8 hours 06/03/16 1321 06/06/16 0835      Assessment/Plan:  Ileus versus bowel obstruction is completely resolved recommend advancing diet as tolerated no acute surgical needs therefore we will sign off please reconsult as needed  Lattie Hawichard E Alline Pio, MD, FACS  06/06/2016

## 2016-06-06 NOTE — Progress Notes (Signed)
[facility] - [place] at [facility]   PATIENT NAME: Willie Baker    MR#:  [id number]  DATE OF BIRTH:  01/19/1961  SUBJECTIVE:  Much more awake today would like to eat,  REVIEW OF SYSTEMS:   Review of Systems  Constitutional: Negative for chills, fever and weight loss.  HENT: Negative for nosebleeds and sore throat.   Eyes: Negative for blurred vision.  Respiratory: Negative for cough, shortness of breath and wheezing.   Cardiovascular: Negative for chest pain, orthopnea, leg swelling and PND.  Gastrointestinal: Negative for abdominal pain, constipation, diarrhea, heartburn, nausea and vomiting.  Genitourinary: Negative for dysuria and urgency.  Musculoskeletal: Negative for back pain.  Skin: Negative for rash.  Neurological: Negative for dizziness, speech change, focal weakness and headaches.  Endo/Heme/Allergies: Does not bruise/bleed easily.  Psychiatric/Behavioral: Negative for depression.   Tolerating Diet: Yes  DRUG ALLERGIES:   Allergies  Allergen Reactions  . Milk-Related Compounds     VITALS:  Blood pressure (!) 145/89, pulse 88, temperature 98 F (36.7 C), temperature source Oral, resp. rate 20, height 6\' 1"  (1.854 m), weight 69.5 kg (153 lb 4.8 oz), SpO2 100 %.  PHYSICAL EXAMINATION:   Physical Exam  GENERAL:  [age] y.o.-year-old patient lying in  bed awake .  EYES: Pupils equal, round, reactive to light. No scleral icterus.   HEENT: Head atraumatic, normocephalic. Oropharynx and nasopharynx clear. Dry Oral Mucosa.   NECK:  Supple, no jugular venous distention. No thyroid enlargement, no tenderness.  LUNGS: Normal breath sounds bilaterally, no wheezing, rales, upper airway rhonchi. No use of accessory muscles of respiration.  CARDIOVASCULAR: S1, S2 normal. No murmurs, rubs, or gallops. tachycardia ABDOMEN: Soft, nontender, non-distended, + BS. No organomegaly or mass.  EXTREMITIES: No cyanosis, clubbing or edema b/l.    NEUROLOGIC:  Cranial nerves II through XII are intact. No focal Motor or sensory deficits b/l.  Encephalopathic PSYCHIATRIC:  patient is alert and oriented x 3 SKIN: No obvious rash, lesion, or ulcer.  LABORATORY PANEL:  CBC  Recent Labs Lab 06/05/16 0436  WBC 10.3  HGB 11.9*  HCT 34.9*  PLT 181    Chemistries   Recent Labs Lab 06/04/16 0548  06/05/16 0436 06/05/16 1807  NA 128*  --  128*  --   K 2.9*  < > 3.5  --   CL 97*  --  98*  --   CO2 25  --  24  --   GLUCOSE 130*  --  127*  --   BUN 5*  --  8  --   CREATININE 0.43*  --  0.44*  --   CALCIUM 8.0*  --  7.9*  --   MG 1.5*  --  1.5* 1.8  AST 28  --   --   --   ALT 19  --   --   --   ALKPHOS 44  --   --   --   BILITOT 1.3*  --   --   --   < > = values in this interval not displayed. Cardiac Enzymes No results for input(s): TROPONINI in the last 168 hours. RADIOLOGY:  Dg Abd 2 Views  Result Date: 06/05/2016 CLINICAL DATA:  Abdominal pain and distention of unknown etiology EXAM: ABDOMEN - 2 VIEW COMPARISON:  Abdominal series of April 03, 2017. FINDINGS: There remain loops of moderately distended gas-filled small bowel in the upper and mid abdomen predominantly on the left. The volume of gas present may have  slightly decreased. The maximal diameter of the knees distended small bowel loop is 5.3 cm which has decreased minimally since the previous study. There is some gas within the colon especially the left colon and rectum. A small amount of contrast remains in the rectum. There are no abnormal soft tissue calcifications. The bony structures exhibit no acute abnormalities. IMPRESSION: Persistent partial mid to distal small bowel obstruction with slight overall decrease in the volume of gas within distended small bowel loops. No evidence of perforation. Electronically Signed   By: David  [place]iland M.D.   On: 06/05/2016 10:03   ASSESSMENT AND PLAN:  56 yo male with no past medical history presented to the ED with abdominal pain and  non-bloody emesis. Admitted for persistent tachycardia.  1. SBO/ileus - appreciate surgical input.  - tolerated CLD, start soft diet and stop TPN  2. Alcohol abuse/withdrawal- Cont. CIWA.   3. Hyponatremia -due to alcohol abuse -No labs today we will recheck tomorrow. continue on normal saline  4. Hypokalemia/Hypomagnesemia -replete and recheck  5. Nutrition -stop TPN will start full liquid/soft diet as he tolerated clear liquid diet yesterday  6. Fever of unknown origin - source unclear.  Tylenol given.  Remains afebrile overnight.  - UA was (-), CXR (-).  Blood cultures (-) so far.  We will stop Zosyn today as he has been afebrile for last 48 hours  Pt's prognosis is poor.   Case discussed with Care Management/Social Worker. Management plans discussed with the patient, family, Dr. Excell Seltzer and they are in agreement.  CODE STATUS: full  DVT Prophylaxis: Ted's & SCD's  TOTAL TIME TAKING CARE OF THIS PATIENT: 30 minutes.    Note: This dictation was prepared with Dragon dictation along with smaller phrase technology. Any transcriptional errors that result from this process are unintentional.  Delfino Lovett M.D on 06/06/2016 at 8:31 AM  Between 7am to 6pm - Pager - [phone]  After 6pm go to [email or web address] - Social research officer, government  [facility]  Office  [phone]  CC: Primary care physician; No PCP Per Patient

## 2016-06-07 LAB — BASIC METABOLIC PANEL
ANION GAP: 6 (ref 5–15)
BUN: 10 mg/dL (ref 6–20)
CO2: 21 mmol/L — ABNORMAL LOW (ref 22–32)
Calcium: 7.7 mg/dL — ABNORMAL LOW (ref 8.9–10.3)
Chloride: 95 mmol/L — ABNORMAL LOW (ref 101–111)
Creatinine, Ser: 0.42 mg/dL — ABNORMAL LOW (ref 0.61–1.24)
Glucose, Bld: 94 mg/dL (ref 65–99)
POTASSIUM: 3.8 mmol/L (ref 3.5–5.1)
Sodium: 122 mmol/L — ABNORMAL LOW (ref 135–145)

## 2016-06-07 LAB — OSMOLALITY: Osmolality: 255 mOsm/kg — ABNORMAL LOW (ref 275–295)

## 2016-06-07 LAB — SODIUM: Sodium: 122 mmol/L — ABNORMAL LOW (ref 135–145)

## 2016-06-07 LAB — CBC
HCT: 30.8 % — ABNORMAL LOW (ref 40.0–52.0)
HEMOGLOBIN: 10.8 g/dL — AB (ref 13.0–18.0)
MCH: 33.5 pg (ref 26.0–34.0)
MCHC: 34.9 g/dL (ref 32.0–36.0)
MCV: 96 fL (ref 80.0–100.0)
PLATELETS: 231 10*3/uL (ref 150–440)
RBC: 3.21 MIL/uL — AB (ref 4.40–5.90)
RDW: 13 % (ref 11.5–14.5)
WBC: 10 10*3/uL (ref 3.8–10.6)

## 2016-06-07 LAB — GLUCOSE, CAPILLARY
Glucose-Capillary: 73 mg/dL (ref 65–99)
Glucose-Capillary: 76 mg/dL (ref 65–99)
Glucose-Capillary: 83 mg/dL (ref 65–99)

## 2016-06-07 LAB — OSMOLALITY, URINE: OSMOLALITY UR: 629 mosm/kg (ref 300–900)

## 2016-06-07 LAB — MAGNESIUM: Magnesium: 1.6 mg/dL — ABNORMAL LOW (ref 1.7–2.4)

## 2016-06-07 MED ORDER — MAGNESIUM SULFATE 2 GM/50ML IV SOLN
2.0000 g | Freq: Once | INTRAVENOUS | Status: AC
  Administered 2016-06-07: 2 g via INTRAVENOUS
  Filled 2016-06-07: qty 50

## 2016-06-07 MED ORDER — ENSURE ENLIVE PO LIQD
237.0000 mL | Freq: Three times a day (TID) | ORAL | Status: DC
  Administered 2016-06-10 – 2016-06-22 (×23): 237 mL via ORAL

## 2016-06-07 NOTE — Progress Notes (Signed)
[facility] - [place] at [facility]   PATIENT NAME: Willie Baker    MR#:  [id number]  DATE OF BIRTH:  Jan 08, 1961  SUBJECTIVE:  Very sleepy today., po intake is poor. REVIEW OF SYSTEMS:   Review of Systems  Unable to perform ROS: Acuity of condition  Constitutional: Negative for chills, fever and weight loss.  HENT: Negative for nosebleeds and sore throat.   Eyes: Negative for blurred vision.  Respiratory: Negative for cough, shortness of breath and wheezing.   Cardiovascular: Negative for chest pain, orthopnea, leg swelling and PND.  Gastrointestinal: Negative for abdominal pain, constipation, diarrhea, heartburn, nausea and vomiting.  Genitourinary: Negative for dysuria and urgency.  Musculoskeletal: Negative for back pain.  Skin: Negative for rash.  Neurological: Negative for dizziness, speech change, focal weakness and headaches.  Endo/Heme/Allergies: Does not bruise/bleed easily.  Psychiatric/Behavioral: Negative for depression.   Tolerating Diet: Yes  DRUG ALLERGIES:   Allergies  Allergen Reactions  . Milk-Related Compounds     VITALS:  Blood pressure 104/67, pulse (!) 124, temperature 98.1 F (36.7 C), temperature source Oral, resp. rate 20, height 6\' 1"  (1.854 m), weight 68.4 kg (150 lb 12.8 oz), SpO2 97 %.  PHYSICAL EXAMINATION:   Physical Exam  GENERAL:  56 y.o.-year-old patient lying in  bed awake .  EYES: Pupils equal, round, reactive to light. No scleral icterus.   HEENT: Head atraumatic, normocephalic. Oropharynx and nasopharynx clear. Dry Oral Mucosa.   NECK:  Supple, no jugular venous distention. No thyroid enlargement, no tenderness.  LUNGS: Normal breath sounds bilaterally, no wheezing, rales, upper airway rhonchi. No use of accessory muscles of respiration.  CARDIOVASCULAR: S1, S2 normal. No murmurs, rubs, or gallops. tachycardia ABDOMEN: Soft, nontender, non-distended, + BS. No organomegaly or mass.  EXTREMITIES: No cyanosis,  clubbing or edema b/l.    NEUROLOGIC: Unable to do neuro exam because  He is sedated, PSYCHIATRIC:  patient is alert and oriented x 3 SKIN: No obvious rash, lesion, or ulcer.  LABORATORY PANEL:  CBC  Recent Labs Lab 06/07/16 0450  WBC 10.0  HGB 10.8*  HCT 30.8*  PLT 231    Chemistries   Recent Labs Lab 06/04/16 0548  06/07/16 0450 06/07/16 1143  NA 128*  < > 122* 122*  K 2.9*  < > 3.8  --   CL 97*  < > 95*  --   CO2 25  < > 21*  --   GLUCOSE 130*  < > 94  --   BUN 5*  < > 10  --   CREATININE 0.43*  < > 0.42*  --   CALCIUM 8.0*  < > 7.7*  --   MG 1.5*  < > 1.6*  --   AST 28  --   --   --   ALT 19  --   --   --   ALKPHOS 44  --   --   --   BILITOT 1.3*  --   --   --   < > = values in this interval not displayed. Cardiac Enzymes No results for input(s): TROPONINI in the last 168 hours. RADIOLOGY:  No results found. ASSESSMENT AND PLAN:  56 yo male with no past medical history presented to the ED with abdominal pain and non-bloody emesis. Admitted for persistent tachycardia.  1. SBO/ileus; resolved.very sleepy.not eating much. 2. Alcohol abuse/withdrawal- Cont. CIWA.   3. Hyponatremia -due to alcohol abuse -No labs today we will recheck tomorrow. continue on  normal saline   Check NA again.check serum ,urine osmolality. 4. Hypokalemia/Hypomagnesemia -replete and recheck  5. Nutrition -stopped  TPN ,advanced diet .but pt is not eating well. 6. Fever of unknown origin - source unclear.  Tylenol given.  Remains afebrile overnight.  - UA was (-), CXR (-).  Blood cultures (-) so far.  We will stop Zosyn today as he has been afebrile for last 48 hours  Pt's prognosis is poor.   Case discussed with Care Management/Social Worker. Management plans discussed with the patient, family, Dr. Excell Seltzercooper and they are in agreement.  CODE STATUS: full  DVT Prophylaxis: Ted's & SCD's  TOTAL TIME TAKING CARE OF THIS PATIENT: 30 minutes.    Note: This dictation was prepared  with Dragon dictation along with smaller phrase technology. Any transcriptional errors that result from this process are unintentional.  Gretna Bergin M.D on 06/07/2016 at 1:45 PM  Between 7am to 6pm - Pager - [phone]  After 6pm go to [email or web address] - Social research officer, governmentpassword [employer]AS [facility]  [facility]  Office  [phone]  CC: Primary care physician; No PCP Per Patient

## 2016-06-07 NOTE — Progress Notes (Signed)
Calorie count order placed for patient beginning tomorrow. Please obtain documentation sheets from dietician. Patient refused dinner - documented in intake flowsheet. Patient encouraged. Willie Baker,Willie Dalgleish S, RN

## 2016-06-07 NOTE — Progress Notes (Addendum)
Nutrition Follow-up  DOCUMENTATION CODES:   Non-severe (moderate) malnutrition in context of chronic illness  INTERVENTION:  -Feeding assistance and encouragement at all meal times -Recommend addition of Ensure Enlive po TID between meals, each supplement provides 350 kcal and 20 grams of protein -Begin 48 hour Calorie Count   NUTRITION DIAGNOSIS:   Malnutrition (Moderate) related to chronic illness, poor appetite (abdominal pain, NV) as evidenced by energy intake < 75% for > or equal to 1 month, moderate depletions of muscle mass, moderate depletion of body fat.  Continues, being addressed as diet advanced, adding supplements, feeding assistance/encouragement  GOAL:   Patient will meet greater than or equal to 90% of their needs  MONITOR:   PO intake, Supplement acceptance, Labs, I & O's, Weight trends  REASON FOR ASSESSMENT:   Malnutrition Screening Tool    ASSESSMENT:   56 year old male with no PMHx presented with abdominal pain and non-bloody emesis admitted for persistent tachycardia.   Pt remains on CIWA, remains disoriented Pt obtunded on visit today, did not arouse to voice, per Lance BoschShea RN, pt has been sedated all day.  Ileus vs SBO has resolved, TPN discontinued Diet advanced to Soft 2/20; recorded po 20% at dinner last night. No other recorded po intake.  Per Lance BoschShea RN, pt has not eaten breakfast or lunch today Pt with poor dentition but per RN pt has not demonstrated previously any issues chewing Hyponatremic, UOP unmeasured but pt with multiple urine occurrences per I/O flowsheet Labs: sodium 122 Meds: NS at 75 ml/hr  Diet Order:  DIET SOFT Room service appropriate? Yes; Fluid consistency: Thin  Skin:  Reviewed, no issues  Last BM:  06/04/2016  Height:   Ht Readings from Last 1 Encounters:  06/06/16 6\' 1"  (1.854 m)    Weight:   Wt Readings from Last 1 Encounters:  06/07/16 150 lb 12.8 oz (68.4 kg)   Filed Weights   06/05/16 0500 06/06/16 0500  06/07/16 0500  Weight: 157 lb (71.2 kg) 153 lb 4.8 oz (69.5 kg) 150 lb 12.8 oz (68.4 kg)    Ideal Body Weight:  83.6 kg  BMI:  Body mass index is 19.9 kg/m.  Estimated Nutritional Needs:   Kcal:  1900-2065 (MSJ x 1.2-1.3)  Protein:  70-83 grams (1-1.2 grams/kg)  Fluid:  2 L/day (30 ml/kg)  EDUCATION NEEDS:   No education needs identified at this time  Romelle StarcherCate Jaella Weinert MS, RD, LDN [phone] Pager  [phone] Weekend/On-Call Pager

## 2016-06-07 NOTE — Progress Notes (Signed)
Dr. Tobi BastosPyreddy paged for Na 122.  Ordered recheck @ 0900.

## 2016-06-07 NOTE — Plan of Care (Addendum)
Problem: Education: Goal: Knowledge of [facility] Education information/materials will improve Outcome: Progressing Tachycardic during shift, HR 120s-130s.  Received PRN IV Metoprolol 5mg  x2.  VSS otherwise, free of falls.  Received PRN IV Ativan 2mg  x2 for agitation, CIWA 12.  Pt disoriented x3, stated multiple times he was leaving, RN reoriented, pt resting comfortably during majority of shift.  Bed in low position, floor mat in place.  Call bell within reach, WCTM.

## 2016-06-08 ENCOUNTER — Inpatient Hospital Stay: Payer: Medicaid Other

## 2016-06-08 DIAGNOSIS — F1096 Alcohol use, unspecified with alcohol-induced persisting amnestic disorder: Secondary | ICD-10-CM

## 2016-06-08 DIAGNOSIS — F1010 Alcohol abuse, uncomplicated: Secondary | ICD-10-CM

## 2016-06-08 LAB — CULTURE, BLOOD (ROUTINE X 2)
Culture: NO GROWTH
Culture: NO GROWTH

## 2016-06-08 LAB — AMMONIA: AMMONIA: 14 umol/L (ref 9–35)

## 2016-06-08 LAB — GLUCOSE, CAPILLARY
Glucose-Capillary: 81 mg/dL (ref 65–99)
Glucose-Capillary: 77 mg/dL (ref 65–99)
Glucose-Capillary: 77 mg/dL (ref 65–99)
Glucose-Capillary: 75 mg/dL (ref 65–99)
Glucose-Capillary: 77 mg/dL (ref 65–99)

## 2016-06-08 LAB — MAGNESIUM: MAGNESIUM: 1.5 mg/dL — AB (ref 1.7–2.4)

## 2016-06-08 LAB — BASIC METABOLIC PANEL
Anion gap: 10 (ref 5–15)
BUN: 7 mg/dL (ref 6–20)
CHLORIDE: 95 mmol/L — AB (ref 101–111)
CO2: 20 mmol/L — ABNORMAL LOW (ref 22–32)
CREATININE: 0.43 mg/dL — AB (ref 0.61–1.24)
Calcium: 7.8 mg/dL — ABNORMAL LOW (ref 8.9–10.3)
Glucose, Bld: 82 mg/dL (ref 65–99)
Potassium: 3.4 mmol/L — ABNORMAL LOW (ref 3.5–5.1)
SODIUM: 125 mmol/L — AB (ref 135–145)

## 2016-06-08 MED ORDER — LORAZEPAM BOLUS VIA INFUSION: 1.0000 mg | INTRAVENOUS | Status: DC | PRN

## 2016-06-08 MED ORDER — MAGNESIUM SULFATE 2 GM/50ML IV SOLN
2.0000 g | Freq: Once | INTRAVENOUS | Status: AC
  Administered 2016-06-08: 17:00:00 2 g via INTRAVENOUS
  Filled 2016-06-08: qty 50

## 2016-06-08 MED ORDER — LORAZEPAM 2 MG/ML IJ SOLN
1.0000 mg | Freq: Once | INTRAMUSCULAR | Status: AC
  Administered 2016-06-08: 15:00:00 1 mg via INTRAVENOUS
  Filled 2016-06-08: qty 1

## 2016-06-08 MED ORDER — LORAZEPAM 2 MG/ML IJ SOLN
1.0000 mg | INTRAMUSCULAR | Status: DC | PRN
  Administered 2016-06-08 – 2016-06-11 (×9): 1 mg via INTRAVENOUS
  Filled 2016-06-08 (×9): qty 1

## 2016-06-08 MED ORDER — RISPERIDONE 0.5 MG PO TBDP
0.5000 mg | ORAL_TABLET | Freq: Two times a day (BID) | ORAL | Status: DC
  Administered 2016-06-10 – 2016-06-13 (×4): 0.5 mg via ORAL
  Filled 2016-06-08 (×10): qty 1

## 2016-06-08 MED ORDER — POTASSIUM CHLORIDE 20 MEQ PO PACK
20.0000 meq | PACK | Freq: Once | ORAL | Status: AC
  Administered 2016-06-08: 20 meq via ORAL
  Filled 2016-06-08: qty 1

## 2016-06-08 NOTE — Evaluation (Signed)
Physical Therapy Evaluation Patient Details Name: Willie Baker MRN: [id number] DOB: 04/24/1960 Today's Date: 06/08/2016   History of Present Illness  56 y/o male here with addominal pain, bloody vomiting, admitted with tachycardia.  Pt on CIWA protocol.  Clinical Impression  Pt struggles to fully participate with PT, more interested in figuring out how to get a beer or cigarette.  He was very weak and though he acted as though he would be able to get up and walk w/o an issues he did seem to realize how weak her really was when he was unable to get to standing on multiple attempts even with considerable assist from the PT.  Overall pt very weak and limited will need STR though it is unlikely that pt will agree to this w/o further convincing.   Follow Up Recommendations SNF    Equipment Recommendations  Rolling walker with 5" wheels    Recommendations for Other Services       Precautions / Restrictions Precautions Precautions: Fall Restrictions Weight Bearing Restrictions: No      Mobility  Bed Mobility Overal bed mobility: Needs Assistance Bed Mobility: Supine to Sit;Sit to Supine     Supine to sit: Min assist Sit to supine: Min assist   General bed mobility comments: Pt seemed to be confident that he could get up to sitting, but ultimately needed assist to get uprigt and back into bed  Transfers Overall transfer level: Needs assistance Equipment used: Rolling walker (2 wheeled) Transfers: Sit to/from Stand Sit to Stand: Total assist;Max assist         General transfer comment: 2 attempts at standing, pt unable to get to fully upright and even with excessive assist was too weak to get up to standing  Ambulation/Gait             General Gait Details: unable, unsafe  Stairs            Wheelchair Mobility    Modified Rankin (Stroke Patients Only)       Balance Overall balance assessment: Needs assistance Sitting-balance support: Bilateral upper  extremity supported Sitting balance-Leahy Scale: Fair Sitting balance - Comments: Pt inconsistent and leaning t/o his time in sitting     Standing balance-Leahy Scale: Zero Standing balance comment: Pt too weak to get to full standing                             Pertinent Vitals/Pain Pain Assessment: No/denies pain    Home Living Family/patient expects to be discharged to:: Private residence Living Arrangements: Spouse/significant other     Home Access:  (Pt did not respond to much of the information gathering)              Prior Function           Comments: Pt reports that he is normally able to do all he needs, states he works     Higher education careers adviserHand Dominance        Extremity/Trunk Assessment   Upper Extremity Assessment Upper Extremity Assessment: Difficult to assess due to impaired cognition (Pt did appear to have grossly functional strength/ROM?)    Lower Extremity Assessment Lower Extremity Assessment: Difficult to assess due to impaired cognition (Pt did appear to have grossly functional strength/ROM?)       Communication   Communication: No difficulties  Cognition Arousal/Alertness: Awake/alert Behavior During Therapy: Agitated Overall Cognitive Status: Difficult to assess  General Comments: Pt's first comment was "Can you get me a cigarette and a beer?"    General Comments General comments (skin integrity, edema, etc.): Pt's heart rate elevated the entire time, 130s after standing attempt    Exercises     Assessment/Plan    PT Assessment Patient needs continued PT services  PT Problem List Decreased strength;Decreased activity tolerance;Decreased balance;Decreased mobility;Decreased cognition;Decreased knowledge of use of DME;Decreased safety awareness;Cardiopulmonary status limiting activity       PT Treatment Interventions Gait training;DME instruction;Stair training;Functional mobility training;Therapeutic  activities;Therapeutic exercise;Neuromuscular re-education;Balance training;Cognitive remediation;Patient/family education    PT Goals (Current goals can be found in the Care Plan section)  Acute Rehab PT Goals Patient Stated Goal: Go home and get a smoke PT Goal Formulation: Patient unable to participate in goal setting Time For Goal Achievement: 06/22/16 Potential to Achieve Goals: Fair    Frequency Min 2X/week   Barriers to discharge        Co-evaluation               End of Session Equipment Utilized During Treatment: Gait belt Activity Tolerance: Patient limited by fatigue;Treatment limited secondary to agitation Patient left: with bed alarm set;with call bell/phone within reach Nurse Communication: Mobility status PT Visit Diagnosis: Muscle weakness (generalized) (M62.81);Difficulty in walking, not elsewhere classified (R26.2);Unsteadiness on feet (R26.81)         Time: 1610-9604 PT Time Calculation (min) (ACUTE ONLY): 23 min   Charges:   PT Evaluation $PT Eval Low Complexity: 1 Procedure     PT G Codes:         Malachi Pro, DPT 06/08/2016, 2:00 PM

## 2016-06-08 NOTE — Progress Notes (Signed)
[facility] - [place] [facility]   PATIENT NAME: Willie Baker    MR#:  [id number]  DATE OF BIRTH:  04/21/1960  SUBJECTIVE:  Patient is awake but confused. Asking for his car.Marland Kitchen. REVIEW OF SYSTEMS:   Review of Systems  Unable to perform ROS: Acuity of condition  Constitutional: Negative for chills, fever and weight loss.  HENT: Negative for nosebleeds and sore throat.   Eyes: Negative for blurred vision.  Respiratory: Negative for cough, shortness of breath and wheezing.   Cardiovascular: Negative for chest pain, orthopnea, leg swelling and PND.  Gastrointestinal: Negative for abdominal pain, constipation, diarrhea, heartburn, nausea and vomiting.  Genitourinary: Negative for dysuria and urgency.  Musculoskeletal: Negative for back pain.  Skin: Negative for rash.  Neurological: Negative for dizziness, speech change, focal weakness and headaches.  Endo/Heme/Allergies: Does not bruise/bleed easily.  Psychiatric/Behavioral: Negative for depression.   Tolerating Diet: Yes  DRUG ALLERGIES:   Allergies  Allergen Reactions  . Milk-Related Compounds     VITALS:  Blood pressure (!) 142/57, pulse 96, temperature 98.1 F (36.7 C), resp. rate 18, height 6\' 1"  (1.854 m), weight 74.8 kg (164 lb 14.4 oz), SpO2 100 %.  PHYSICAL EXAMINATION:   Physical Exam  GENERAL:  56 y.o.-year-old patient lying in  bed awake .  EYES: Pupils equal, round, reactive to light. No scleral icterus.   HEENT: Head atraumatic, normocephalic. Oropharynx and nasopharynx clear. Dry Oral Mucosa.   NECK:  Supple, no jugular venous distention. No thyroid enlargement, no tenderness.  LUNGS: Normal breath sounds bilaterally, no wheezing, rales, upper airway rhonchi. No use of accessory muscles of respiration.  CARDIOVASCULAR: S1, S2 normal. No murmurs, rubs, or gallops. tachycardia ABDOMEN: Soft, nontender, non-distended, + BS. No organomegaly or mass.  EXTREMITIES: No cyanosis, clubbing or  edema b/l.    NEUROLOGIC: Unable to do neuro exam because  He is  Confused. PSYCHIATRIC:  patient is awake but  confused SKIN: No obvious rash, lesion, or ulcer.  LABORATORY PANEL:  CBC  Recent Labs Lab 06/07/16 0450  WBC 10.0  HGB 10.8*  HCT 30.8*  PLT 231    Chemistries   Recent Labs Lab 06/04/16 0548  06/08/16 0500  NA 128*  < > 125*  K 2.9*  < > 3.4*  CL 97*  < > 95*  CO2 25  < > 20*  GLUCOSE 130*  < > 82  BUN 5*  < > 7  CREATININE 0.43*  < > 0.43*  CALCIUM 8.0*  < > 7.8*  MG 1.5*  < > 1.5*  AST 28  --   --   ALT 19  --   --   ALKPHOS 44  --   --   BILITOT 1.3*  --   --   < > = values in this interval not displayed. Cardiac Enzymes No results for input(s): TROPONINI in the last 168 hours. RADIOLOGY:  No results found. ASSESSMENT AND PLAN:  56 yo male with no past medical history presented to the ED with abdominal pain and non-bloody emesis. Admitted for persistent tachycardia.  1. SBO/ileus; resolved.very sleepy.not eating much,was on tpn before but stopped.  2. Alcohol abuse/withdrawal- he here since February 8. We will get psychiatric consult for persistent agitation, confusion Finished ETOH withdrwal Check MRI brain 3.hypovolemichyponatremia: Improving with IV hydration . 4. Hypokalemia/Hypomagnesemia -replete and recheck  5. Nutrition -stopped  TPN ,advanced diet .but pt is not eating well.Daily calorie count. 6. Fever of unknown origin -  source unclear.  Tylenol given.  Remains afebrile overnight.  - UA was (-), CXR (-).  Blood cultures (-) so far.  We will stop Zosyn today as he has been afebrile  Pt's prognosis is poor.   Case discussed with Care Management/Social Worker. Management plans discussed with the patient, family, Dr. Excell Seltzer and they are in agreement.  CODE STATUS: full  DVT Prophylaxis: Ted's & SCD's  TOTAL TIME TAKING CARE OF THIS PATIENT: 30 minutes.    Note: This dictation was prepared with Dragon dictation along with  smaller phrase technology. Any transcriptional errors that result from this process are unintentional.  Elinda Bunten M.D on 06/08/2016 at 12:43 PM  Between 7am to 6pm - Pager - [phone]  After 6pm go to [email or web address] - Social research officer, government  [facility]  Office  [phone]  CC: Primary care physician; No PCP Per Patient

## 2016-06-08 NOTE — Progress Notes (Signed)
Calorie Count Note  48 hour calorie count ordered. Day 1 results below:  Diet: Soft Supplements: Ensure Enlive po TID between meals, each supplement provides 350 kcal and 20 grams of protein  Dinner 2/21: 0 kcal, 0 grams of protein Breakfast 2/22: 0 kcal, 0 grams of protein Lunch 2/22: 0 kcal, 0 grams of protein Supplements: N/A - patient refusing  Total intake: 0 kcal (0% of minimum estimated needs)  0 grams of protein (0% of minimum estimated needs)  Estimated Nutritional Needs:  Kcal:  1900-2065 (MSJ x 1.2-1.3) Protein:  70-83 grams (1-1.2 grams/kg) Fluid:  2 L/day (30 ml/kg)  Nutrition Dx: Malnutrition (Moderate) related to chronic illness, poor appetite (abdominal pain, NV) as evidenced by energy intake < 75% for > or equal to 1 month, moderate depletions of muscle mass, moderate depletion of body fat.  Goal: Patient will meet greater than or equal to 90% of their needs  Intervention:  -Feeding assistance and encouragement at all meal times -Continue Ensure Enlive po TID between meals, each supplement provides 350 kcal and 20 grams of protein -Continue 48 hour Calorie Count -Recommend placing NG tube for initiation of enteral nutrition as patient unable to meet needs with PO intake.   Helane RimaLeanne Larrie Fraizer, MS, RD, LDN Pager: [phone] After Hours Pager: [phone]

## 2016-06-08 NOTE — Plan of Care (Signed)
Problem: Education: Goal: Knowledge of [facility] Education information/materials will improve Outcome: Progressing Tachycardic during shift, received PRN metoprolol 5mg  x1.  No complaints overnight.  Combative at times, while staff cleaning him up/turning him to remove nicotine patch.  Asleep majority of shift, no Ativan needed since 2100.  Bed in low position, floor mat in place.  Call bell within reach, WCTM.

## 2016-06-08 NOTE — Progress Notes (Signed)
Notified Dr Luberta MutterKonidena that MRI pending. Pt agitated at times and needs med to calm down. Also notified of serum  Magnesiun 1.5. Per MD to d/c CIWA orders and prn haldol and ativan. Per MD to give 1mg  ativan IV once. Pharmacy notified by MD to dose magnesium.

## 2016-06-08 NOTE — Plan of Care (Signed)
Problem: Education: Goal: Knowledge of [facility] Education information/materials will improve Outcome: Not Progressing Pt confused. Alert to self only. Agitated at times. Ativan given with slight improvement. MRI not done due to pt's agitation. Dr Luberta MutterKonidena is aware. CIWA orders d/c.  Problem: Nutrition: Goal: Adequate nutrition will be maintained Outcome: Not Progressing Poor appetite. Pt refuses meals and Ensures. Pt ate one apple sauce during the shift and sips of water. IVF infusing.   Problem: Education: Goal: Knowledge of disease or condition will improve Outcome: Not Progressing Pt confused.

## 2016-06-08 NOTE — Progress Notes (Addendum)
Patient ID: Willie Baker, male   DOB: 01/05/1961, 56 y.o.   MRN: [id number] Called by nursing regarding persistent severe agitation. Resume Ativan 1 mg IV every 4 hours as needed for agitation.  Safety sitter ordered if needed.   Continue to monitor.

## 2016-06-08 NOTE — Progress Notes (Signed)
MEDICATION RELATED CONSULT NOTE - INITIAL   Pharmacy Consult for Electrolyte monitoring Indication: hypokalemia, hypomagnesia  Allergies  Allergen Reactions  . Milk-Related Compounds     Recent Labs  06/06/16 1023 06/07/16 0450 06/08/16 0500  WBC  --  10.0  --   HGB  --  10.8*  --   HCT  --  30.8*  --   PLT  --  231  --   CREATININE 0.36* 0.42* 0.43*  MG 1.3* 1.6* 1.5*  PHOS 2.6  --   --    Lab Results  Component Value Date   K 3.4 (L) 06/08/2016   Estimated Creatinine Clearance: 111.7 mL/min (by C-G formula based on SCr of 0.43 mg/dL (L)).   Assessment: 56 yo M w/ partial SBO/ DTs. Patient was on TPN that was discontinued 2/20. Hx ETOH. Patient with poor po intake.  Plan:  K 3.4,  Mag 1.5  Na 125,   Scr 0.43 Will give KCL po 20meq x 1. Will give Magnesium Sulfate 2 gram IV x 1. Will f/u electrolytes with am labs  Willie Baker A 06/08/2016,2:46 PM

## 2016-06-08 NOTE — Consult Note (Signed)
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 Alcohol abuse [F10.10] 06/08/2016  . Abdominal pain of unknown etiology [R10.9]   . Hyponatremia [E87.1]   . SBO (small bowel obstruction) [K56.609]   . Ileus ([place]) [K56.7]   . Tachycardia [R00.0] 05/25/2016    Total Time spent with patient: 1 hour  Subjective:   Willie Baker is a 55 y.o. male patient admitted with "this is a service station".  HPI:  Patient interviewed. Chart reviewed. Spoke with nurses. 56 year old man who has been in the hospital now for a couple of weeks. Treatment in the hospital for small bowel obstruction and also for presumed alcohol withdrawal. He remains confused and at times agitated. Patient himself was pleasant and cooperative with the interview with me but gave no real useful information. He thought that he was in a service station. Did not know the year and did not know his own age. Had no idea why he might be in a hospital. He rambled some stories about things he had done in the past and about his job. Said that he had continued to drink recently but that he thought he had cut way back on it. Was able to tell me that he lived in [place] with his girlfriend. Nurses report that for the most part he is relatively quiet right now but that when they tried to do any kind of treatment with him or touch his body he rapidly gets agitated and at times strikes out and will curse. Observation the patient is not tremulous at all. He is confused but was able to stay attentive to me and hold a conversation with a  little bit of internal coherency. Tachycardic but blood pressure stable.  Social history: Apparently he does live with his girlfriend and I'm told the girlfriend is the main person who visits him. 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  Medical history: Main medical problem here apparently was a small bowel obstruction and ileus.  Substance abuse history: Appears to have a long-standing history of alcohol abuse. Patient was not able to tell me about any treatment he had had in the past. Denied using any other drugs.    Past Psychiatric History: As far as I can tell there is no past significant psychiatric history other than what might be related to alcohol withdrawal. No known history of psychotic disorder or suicide or violence  Risk to Self: Is patient at risk for suicide?: No Risk to Others:   Prior Inpatient Therapy:   Prior Outpatient Therapy:    Past Medical History: History reviewed. No pertinent past medical history.  Past Surgical History:  Procedure Laterality Date  . FACIAL RECONSTRUCTION SURGERY     Family History:  Family History  Problem Relation Age of Onset  . Breast cancer Mother    Family Psychiatric  History: Unknown Social History:  History  Alcohol Use  . Yes     History  Drug Use No    Social History   Social History  . Marital status: Single    Spouse name: N/A  . Number of children:  N/A  . Years of education: N/A   Social History Main Topics  . Smoking status: Current Every Day Smoker  . Smokeless tobacco: Never Used  . Alcohol use Yes  . Drug use: No  . Sexual activity: Not Asked   Other Topics Concern  . None   Social History Narrative  . None   Additional Social History:    Allergies:   Allergies  Allergen Reactions  . Milk-Related Compounds     Labs:  Results for orders placed or performed during the hospital encounter of 05/24/16 (from the past 48 hour(s))  Glucose, capillary     Status: None    Collection Time: 06/06/16  9:40 PM  Result Value Ref Range   Glucose-Capillary 94 65 - 99 mg/dL  CBC     Status: Abnormal   Collection Time: 06/07/16  4:50 AM  Result Value Ref Range   WBC 10.0 3.8 - 10.6 K/uL   RBC 3.21 (L) 4.40 - 5.90 MIL/uL   Hemoglobin 10.8 (L) 13.0 - 18.0 g/dL   HCT 30.8 (L) 40.0 - 52.0 %   MCV 96.0 80.0 - 100.0 fL   MCH 33.5 26.0 - 34.0 pg   MCHC 34.9 32.0 - 36.0 g/dL   RDW 13.0 11.5 - 14.5 %   Platelets 231 150 - 440 K/uL  Basic metabolic panel     Status: Abnormal   Collection Time: 06/07/16  4:50 AM  Result Value Ref Range   Sodium 122 (L) 135 - 145 mmol/L   Potassium 3.8 3.5 - 5.1 mmol/L   Chloride 95 (L) 101 - 111 mmol/L   CO2 21 (L) 22 - 32 mmol/L   Glucose, Bld 94 65 - 99 mg/dL   BUN 10 6 - 20 mg/dL   Creatinine, Ser 0.42 (L) 0.61 - 1.24 mg/dL   Calcium 7.7 (L) 8.9 - 10.3 mg/dL   GFR calc non Af Amer >60 >60 mL/min   GFR calc Af Amer >60 >60 mL/min    Comment: (NOTE) The eGFR has been calculated using the CKD EPI equation. This calculation has not been validated in all clinical situations. eGFR's persistently <60 mL/min signify possible Chronic Kidney Disease.    Anion gap 6 5 - 15  Magnesium     Status: Abnormal   Collection Time: 06/07/16  4:50 AM  Result Value Ref Range   Magnesium 1.6 (L) 1.7 - 2.4 mg/dL  Glucose, capillary     Status: None   Collection Time: 06/07/16  8:08 AM  Result Value Ref Range   Glucose-Capillary 73 65 - 99 mg/dL  Sodium     Status: Abnormal   Collection Time: 06/07/16 11:43 AM  Result Value Ref Range   Sodium 122 (L) 135 - 145 mmol/L  Glucose, capillary     Status: None   Collection Time: 06/07/16 11:54 AM  Result Value Ref Range   Glucose-Capillary 76 65 - 99 mg/dL  Glucose, capillary     Status: None   Collection Time: 06/07/16  5:10 PM  Result Value Ref Range   Glucose-Capillary 83 65 - 99 mg/dL  Osmolality, urine     Status: None   Collection Time: 06/07/16  5:20 PM  Result Value Ref Range    Osmolality, Ur 629 300 - 900 mOsm/kg  Osmolality     Status: Abnormal   Collection Time: 06/07/16  7:00 PM  Result Value Ref Range   Osmolality 255 (L) 275 - 295 mOsm/kg  Glucose, capillary  Status: None   Collection Time: 06/07/16  8:35 PM  Result Value Ref Range   Glucose-Capillary 81 65 - 99 mg/dL  Basic metabolic panel     Status: Abnormal   Collection Time: 06/08/16  5:00 AM  Result Value Ref Range   Sodium 125 (L) 135 - 145 mmol/L   Potassium 3.4 (L) 3.5 - 5.1 mmol/L   Chloride 95 (L) 101 - 111 mmol/L   CO2 20 (L) 22 - 32 mmol/L   Glucose, Bld 82 65 - 99 mg/dL   BUN 7 6 - 20 mg/dL   Creatinine, Ser 0.43 (L) 0.61 - 1.24 mg/dL   Calcium 7.8 (L) 8.9 - 10.3 mg/dL   GFR calc non Af Amer >60 >60 mL/min   GFR calc Af Amer >60 >60 mL/min    Comment: (NOTE) The eGFR has been calculated using the CKD EPI equation. This calculation has not been validated in all clinical situations. eGFR's persistently <60 mL/min signify possible Chronic Kidney Disease.    Anion gap 10 5 - 15  Magnesium     Status: Abnormal   Collection Time: 06/08/16  5:00 AM  Result Value Ref Range   Magnesium 1.5 (L) 1.7 - 2.4 mg/dL  Glucose, capillary     Status: None   Collection Time: 06/08/16  7:27 AM  Result Value Ref Range   Glucose-Capillary 77 65 - 99 mg/dL  Glucose, capillary     Status: None   Collection Time: 06/08/16 11:38 AM  Result Value Ref Range   Glucose-Capillary 77 65 - 99 mg/dL  Ammonia     Status: None   Collection Time: 06/08/16  2:00 PM  Result Value Ref Range   Ammonia 14 9 - 35 umol/L  Glucose, capillary     Status: None   Collection Time: 06/08/16  4:40 PM  Result Value Ref Range   Glucose-Capillary 75 65 - 99 mg/dL    Current Facility-Administered Medications  Medication Dose Route Frequency Provider Last Rate Last Dose  . 0.9 %  sodium chloride infusion   Intravenous Continuous Gladstone Lighter, MD 75 mL/hr at 06/08/16 1904    . acetaminophen (TYLENOL) tablet 650 mg   650 mg Oral Q6H PRN Harrie Foreman, MD   650 mg at 05/25/16 1935   Or  . acetaminophen (TYLENOL) suppository 650 mg  650 mg Rectal Q6H PRN Harrie Foreman, MD   650 mg at 06/03/16 1349  . docusate sodium (COLACE) capsule 100 mg  100 mg Oral BID Harrie Foreman, MD   100 mg at 06/08/16 1123  . feeding supplement (ENSURE ENLIVE) (ENSURE ENLIVE) liquid 237 mL  237 mL Oral TID BM Epifanio Lesches, MD      . folic acid (FOLVITE) tablet 1 mg  1 mg Oral Daily Vipul Shah, MD   1 mg at 06/08/16 1124  . insulin aspart (novoLOG) injection 0-9 Units  0-9 Units Subcutaneous TID AC & HS Alexis Hugelmeyer, DO      . MEDLINE mouth rinse  15 mL Mouth Rinse BID Henreitta Leber, MD   15 mL at 06/06/16 0924  . metoprolol (LOPRESSOR) injection 5 mg  5 mg Intravenous Q6H PRN Lance Coon, MD   5 mg at 06/07/16 1943  . nicotine (NICODERM CQ - dosed in mg/24 hr) patch 14 mg  14 mg Transdermal Daily Lance Coon, MD   14 mg at 06/08/16 0505  . ondansetron (ZOFRAN) tablet 4 mg  4 mg Oral Q6H PRN Norva Riffle  Marcille Blanco, MD       Or  . ondansetron [facility]) injection 4 mg  4 mg Intravenous Q6H PRN Harrie Foreman, MD      . pantoprazole (PROTONIX) EC tablet 40 mg  40 mg Oral QAC breakfast Max Sane, MD   40 mg at 06/08/16 1123  . risperiDONE (RISPERDAL M-TABS) disintegrating tablet 0.5 mg  0.5 mg Oral BID Gonzella Lex, MD      . sodium chloride flush (NS) 0.9 % injection 3 mL  3 mL Intravenous Q12H Harrie Foreman, MD   3 mL at 06/08/16 1126  . thiamine (VITAMIN B-1) tablet 100 mg  100 mg Oral Daily Harrie Foreman, MD   100 mg at 06/08/16 1124   Or  . thiamine (B-1) injection 100 mg  100 mg Intravenous Daily Harrie Foreman, MD   100 mg at 06/07/16 0539  . tiotropium (SPIRIVA) inhalation capsule 18 mcg  18 mcg Inhalation Daily Harrie Foreman, MD   18 mcg at 05/25/16 7673    Musculoskeletal: Strength & Muscle Tone: decreased Gait & Station: unable to stand Patient leans: Backward  Psychiatric  Specialty Exam: Physical Exam  Nursing note and vitals reviewed. Constitutional: He appears well-developed.  HENT:  Head: Normocephalic and atraumatic.  Eyes: Conjunctivae are normal. Pupils are equal, round, and reactive to light.  Neck: Normal range of motion.  Cardiovascular: Regular rhythm and normal heart sounds.   Respiratory: Effort normal. No respiratory distress.  GI: Soft.  Musculoskeletal: Normal range of motion.  Neurological: He is alert.  Skin: Skin is warm and dry.  Psychiatric: His affect is blunt. His speech is tangential. He is slowed. He is not aggressive, not hyperactive and not combative. Thought content is delusional. Thought content is not paranoid. Cognition and memory are impaired. He expresses impulsivity. He expresses no homicidal and no suicidal ideation. He is noncommunicative. He exhibits abnormal recent memory.    Review of Systems  Constitutional: Negative.   HENT: Negative.   Eyes: Negative.   Respiratory: Negative.   Cardiovascular: Negative.   Gastrointestinal: Negative.   Musculoskeletal: Positive for myalgias.  Skin: Negative.   Neurological: Negative.   Psychiatric/Behavioral: Negative for depression, hallucinations, memory loss, substance abuse and suicidal ideas. The patient is not nervous/anxious and does not have insomnia.     Blood pressure 104/66, pulse (!) 116, temperature 98.1 F (36.7 C), resp. rate 18, height 6' 1" (1.854 m), weight 74.8 kg (164 lb 14.4 oz), SpO2 99 %.Body mass index is 21.76 kg/m.  General Appearance: Casual  Eye Contact:  Fair  Speech:  Slow  Volume:  Decreased  Mood:  Euthymic  Affect:  Constricted  Thought Process:  Disorganized and Irrelevant  Orientation:  Full (Time, Place, and Person)  Thought Content:  Delusions and Tangential  Suicidal Thoughts:  No  Homicidal Thoughts:  No  Memory:  Immediate;   Fair Recent;   Poor Remote;   Poor  Judgement:  Impaired  Insight:  Lacking  Psychomotor Activity:   Decreased  Concentration:  Concentration: Poor  Recall:  Poor  Fund of Knowledge:  Fair  Language:  Fair  Akathisia:  No  Handed:  Right  AIMS (if indicated):     Assets:  Housing  ADL's:  Impaired  Cognition:  Impaired,  Mild and Moderate  Sleep:        Treatment Plan Summary: Daily contact with patient to assess and evaluate symptoms and progress in treatment, Medication management and Plan 56 year old  man who is still confused and at times agitated a couple weeks after being in the hospital. The presumptive diagnosis appears to of been delirium tremens although right now he is not getting standing regular doses of Ativan. He has been in the hospital since February 9. It's not completely unheard of to have 2 weeks of DTs but it would be a remarkably long time for them to go on. My evaluation of the patient today is that he just doesn't seem like a person with delirium tremens. He is not shaky and he is really not so much delirious as he is confused demented and psychotic. 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 I could be wrong but at this point that seems to fit the clinical picture best. Worst case scenario would be that this won't improve at all and he will remain confused to the point of needing total care indefinitely. Best cases that he may show significant improvement over the course of the next couple weeks or so. I would recommend benzodiazepines be minimized any antipsychotics be used in preference if needed for agitation. I am going to start a half milligram of risperidone twice a day standing for the agitation and confusion. I will follow-up in the hospital. Basic nutrition and medical care to continue as usual. It might be useful to try to get an MRI scan of his brain at some point although in his current condition I doubt he could cooperate with that. I will follow-up as  needed.  Disposition: Patient does not meet criteria for psychiatric inpatient admission. Supportive therapy provided about ongoing stressors.  Alethia Berthold, MD 06/08/2016 7:14 PM

## 2016-06-09 ENCOUNTER — Inpatient Hospital Stay: Payer: Medicaid Other

## 2016-06-09 LAB — BASIC METABOLIC PANEL
ANION GAP: 5 (ref 5–15)
BUN: <5 mg/dL — ABNORMAL LOW (ref 6–20)
CO2: 26 mmol/L (ref 22–32)
Calcium: 8.3 mg/dL — ABNORMAL LOW (ref 8.9–10.3)
Chloride: 102 mmol/L (ref 101–111)
Creatinine, Ser: 0.63 mg/dL (ref 0.61–1.24)
GFR calc non Af Amer: >60 mL/min (ref >60)
Glucose, Bld: 99 mg/dL (ref 65–99)
POTASSIUM: 3.3 mmol/L — AB (ref 3.5–5.1)
SODIUM: 133 mmol/L — AB (ref 135–145)

## 2016-06-09 LAB — MAGNESIUM
MAGNESIUM: 1.5 mg/dL — AB (ref 1.7–2.4)
Magnesium: 1.4 mg/dL — ABNORMAL LOW (ref 1.7–2.4)

## 2016-06-09 LAB — GLUCOSE, CAPILLARY
Glucose-Capillary: 93 mg/dL (ref 65–99)
Glucose-Capillary: 101 mg/dL — ABNORMAL HIGH (ref 65–99)

## 2016-06-09 LAB — PHOSPHORUS: PHOSPHORUS: 4.1 mg/dL (ref 2.5–4.6)

## 2016-06-09 MED ORDER — HALOPERIDOL LACTATE 5 MG/ML IJ SOLN
2.0000 mg | Freq: Once | INTRAMUSCULAR | Status: AC
  Administered 2016-06-09: 2 mg via INTRAVENOUS
  Filled 2016-06-09 (×2): qty 1

## 2016-06-09 MED ORDER — HALOPERIDOL LACTATE 5 MG/ML IJ SOLN
2.0000 mg | Freq: Once | INTRAMUSCULAR | Status: AC
  Administered 2016-06-09: 19:00:00 2 mg via INTRAVENOUS
  Filled 2016-06-09: qty 1

## 2016-06-09 MED ORDER — SODIUM CHLORIDE 0.9 % IV SOLN
30.0000 meq | Freq: Once | INTRAVENOUS | Status: AC
  Administered 2016-06-09: 12:00:00 30 meq via INTRAVENOUS
  Filled 2016-06-09: qty 15

## 2016-06-09 MED ORDER — HALOPERIDOL LACTATE 5 MG/ML IJ SOLN
2.0000 mg | Freq: Four times a day (QID) | INTRAMUSCULAR | Status: DC | PRN
  Administered 2016-06-10 – 2016-06-11 (×3): 2 mg via INTRAVENOUS
  Filled 2016-06-09 (×3): qty 1

## 2016-06-09 MED ORDER — LORAZEPAM 2 MG/ML IJ SOLN
2.0000 mg | Freq: Once | INTRAMUSCULAR | Status: AC
  Administered 2016-06-09: 2 mg via INTRAVENOUS
  Filled 2016-06-09: qty 1

## 2016-06-09 MED ORDER — MAGNESIUM SULFATE 2 GM/50ML IV SOLN
2.0000 g | Freq: Once | INTRAVENOUS | Status: AC
  Administered 2016-06-09: 2 g via INTRAVENOUS
  Filled 2016-06-09: qty 50

## 2016-06-09 MED ORDER — FOLIC ACID 5 MG/ML IJ SOLN
1.0000 mg | Freq: Every day | INTRAMUSCULAR | Status: DC
  Administered 2016-06-09: 1 mg via INTRAVENOUS
  Filled 2016-06-09 (×3): qty 0.2

## 2016-06-09 NOTE — Progress Notes (Signed)
Physical Therapy Treatment Patient Details Name: Willie Baker MRN: [id number] DOB: 31-Aug-1960 Today's Date: 06/09/2016    History of Present Illness 56 y/o male here with abdominal pain, bloody vomiting, admitted with tachycardia.  Pt on CIWA protocol.    PT Comments    Pt did much better than yesterday's session but is still confused, impulsive and generally showed poor safety awareness.  Pt was able to do multiple standing bouts with limited success with balance but ability to take a few side steps, marching in place, etc but all with knee buckling and generally with poor safety awareness.  Pt inconsistent with supine bed exercises, but was able to do some with regular cuing and encouragement.   Follow Up Recommendations  SNF     Equipment Recommendations  Rolling walker with 5" wheels    Recommendations for Other Services       Precautions / Restrictions Precautions Precautions: Fall Restrictions Weight Bearing Restrictions: No    Mobility  Bed Mobility Overal bed mobility: Needs Assistance Bed Mobility: Supine to Sit;Sit to Supine     Supine to sit: Min assist Sit to supine: Min assist   General bed mobility comments: Pt needed cuing and encouragement to insure safety and generally was slow and labored with getting in/out of bed  Transfers Overall transfer level: Needs assistance Equipment used: Rolling walker (2 wheeled) Transfers: Sit to/from Stand Sit to Stand: Min assist;Min guard         General transfer comment: 3 standing bouts, pt leaning backward with each attempt and needed inconsistent amount of assist to stay forward  Ambulation/Gait Ambulation/Gait assistance: Mod assist;Max assist Ambulation Distance (Feet): 3 Feet Assistive device: Rolling walker (2 wheeled)       General Gait Details: Pt with knee buckling, leaning backward and generally unsafe and unaware attempts at ambulation. He did show good effort (despite impulsiveness) with  side stepping and minimal fwd/back but it was impulsive and generally unsafe and unsteady the whole time   Stairs            Wheelchair Mobility    Modified Rankin (Stroke Patients Only)       Balance Overall balance assessment: Needs assistance Sitting-balance support: Bilateral upper extremity supported Sitting balance-Leahy Scale: Fair Sitting balance - Comments: Pt inconsistent, impulsive and leaning t/o his time in sitting     Standing balance-Leahy Scale: Poor Standing balance comment: Again inconsistent with ability to stay upright, use UEs on walker and generally leaning back and unsteady                    Cognition Arousal/Alertness: Lethargic Behavior During Therapy: Impulsive;Restless Overall Cognitive Status: Difficult to assess                      Exercises General Exercises - Lower Extremity Ankle Circles/Pumps: AROM;10 reps Heel Slides: Strengthening;10 reps Hip ABduction/ADduction: Strengthening;10 reps Hip Flexion/Marching: AROM;10 reps;Standing    General Comments        Pertinent Vitals/Pain Pain Assessment: No/denies pain    Home Living                      Prior Function            PT Goals (current goals can now be found in the care plan section) Progress towards PT goals: Progressing toward goals    Frequency    Min 2X/week      PT Plan Current plan  remains appropriate    Co-evaluation             End of Session Equipment Utilized During Treatment: Gait belt Activity Tolerance: Patient limited by fatigue;Treatment limited secondary to agitation Patient left: with bed alarm set;with call bell/phone within reach;with family/visitor present   PT Visit Diagnosis: Muscle weakness (generalized) (M62.81);Difficulty in walking, not elsewhere classified (R26.2);Unsteadiness on feet (R26.81)     Time: [id number] PT Time Calculation (min) (ACUTE ONLY): 29 min  Charges:  $Therapeutic Exercise:  8-22 mins $Therapeutic Activity: 8-22 mins                    G Codes:       Malachi ProGalen R Mercia Dowe, DPT 06/09/2016, 5:29 PM

## 2016-06-09 NOTE — Progress Notes (Signed)
MEDICATION RELATED CONSULT NOTE   Pharmacy Consult for Electrolyte monitoring Indication: hypokalemia, hypomagnesia  Allergies  Allergen Reactions  . Milk-Related Compounds     Recent Labs  06/07/16 0450 06/08/16 0500 06/09/16 0515  WBC 10.0  --   --   HGB 10.8*  --   --   HCT 30.8*  --   --   PLT 231  --   --   CREATININE 0.42* 0.43* 0.63  MG 1.6* 1.5* 1.5*  PHOS  --   --  4.1   Lab Results  Component Value Date   K 3.3 (L) 06/09/2016   Estimated Creatinine Clearance: 99 mL/min (by C-G formula based on SCr of 0.63 mg/dL).   Assessment: 56 yo M w/ partial SBO/ DTs. Patient was on TPN that was discontinued 2/20. Hx ETOH. Patient with poor po intake.  2/23 K=3.3, Mag=1.5  Plan:  Patient with low K and magnesium levels. MD has already ordered KCl 30mEq IV bolus. Will order Mag sulfate 2g IV for one dose. Follow up on AM labs.  Clovia CuffLisa Artelia Game, PharmD, BCPS 06/09/2016 2:29 PM

## 2016-06-09 NOTE — Progress Notes (Signed)
Calorie Count Note  48 hour calorie count ordered. Day 2 results below:  Diet: Soft Supplements: Ensure Enlive po TID between meals, each supplement provides 350 kcal and 20 grams of protein  Dinner 2/22: 204 kcal, 0 grams of protein (lemonade, 1/2 [place] ice, apple sauce) Breakfast 2/23: 0 kcal, 0 grams of protein Lunch 2/23: 0 kcal, 0 grams of protein Supplements: N/A - refused/too lethargic to drink  Total intake: 204 kcal (11% of minimum estimated needs)  0 grams of protein (0% of minimum estimated needs)  Estimated Nutritional Needs: Kcal:1900-2065 (MSJ x 1.2-1.3) Protein:70-83 grams (1-1.2 grams/kg) Fluid:2 L/day (30 ml/kg)  Nutrition Dx: Malnutrition (Moderate)related to chronic illness, poor appetite (abdominal pain, NV)as evidenced by energy intake < 75% for > or equal to 1 month, moderate depletions of muscle mass, moderate depletion of body fat.  Goal: Patient will meet greater than or equal to 90% of their needs  Intervention:  -Feeding assistance and encouragement at all meal times -Continue Ensure Enlive po TID between meals, each supplement provides 350 kcal and 20 grams of protein -Will discontinue calorie count as 48 hours completed. -Recommend placing NG tube for initiation of enteral nutrition as patient unable to meet needs with PO intake. Consider use of sitter if concerned patient will pull out NG tube.   Helane RimaLeanne Jishnu Jenniges, MS, RD, LDN Pager: [phone] After Hours Pager: [phone]

## 2016-06-09 NOTE — Progress Notes (Signed)
[facility] - [address] at [facility]   PATIENT NAME: Willie Baker    MR#:  [id number]  DATE OF BIRTH:  02/16/1961  SUBJECTIVE:  Patient is seen at bedside, no shortness of breath. Patient very confused, agitated. REVIEW OF SYSTEMS:   Review of Systems  Unable to perform ROS: Acuity of condition  Constitutional: Negative for chills, fever and weight loss.  HENT: Negative for nosebleeds and sore throat.   Eyes: Negative for blurred vision.  Respiratory: Negative for cough, shortness of breath and wheezing.   Cardiovascular: Negative for chest pain, orthopnea, leg swelling and PND.  Gastrointestinal: Negative for abdominal pain, constipation, diarrhea, heartburn, nausea and vomiting.  Genitourinary: Negative for dysuria and urgency.  Musculoskeletal: Negative for back pain.  Skin: Negative for rash.  Neurological: Negative for dizziness, speech change, focal weakness and headaches.  Endo/Heme/Allergies: Does not bruise/bleed easily.  Psychiatric/Behavioral: Negative for depression.   Tolerating Diet: Yes  DRUG ALLERGIES:   Allergies  Allergen Reactions  . Milk-Related Compounds     VITALS:  Blood pressure 110/67, pulse (!) 115, temperature 98.4 F (36.9 C), resp. rate 20, height 6\' 1"  (1.854 m), weight 66.3 kg (146 lb 1.6 oz), SpO2 98 %.  PHYSICAL EXAMINATION:   Physical Exam  GENERAL:  56 y.o.-year-old patient lying in  bed awake .  EYES: Pupils equal, round, reactive to light. No scleral icterus.   HEENT: Head atraumatic, normocephalic. Oropharynx and nasopharynx clear. Dry Oral Mucosa.   NECK:  Supple, no jugular venous distention. No thyroid enlargement, no tenderness.  LUNGS: Normal breath sounds bilaterally, no wheezing, rales, upper airway rhonchi. No use of accessory muscles of respiration.  CARDIOVASCULAR: S1, S2 normal. No murmurs, rubs, or gallops. tachycardia ABDOMEN: Soft, nontender, non-distended, + BS. No organomegaly or mass.   EXTREMITIES: No cyanosis, clubbing or edema b/l.    NEUROLOGIC: Unable to do neuro exam because  He is  Confused. PSYCHIATRIC:  patient is awake but  confused SKIN: No obvious rash, lesion, or ulcer.  LABORATORY PANEL:  CBC  Recent Labs Lab 06/07/16 0450  WBC 10.0  HGB 10.8*  HCT 30.8*  PLT 231    Chemistries   Recent Labs Lab 06/04/16 0548  06/09/16 0515  NA 128*  < > 133*  K 2.9*  < > 3.3*  CL 97*  < > 102  CO2 25  < > 26  GLUCOSE 130*  < > 99  BUN 5*  < > <5*  CREATININE 0.43*  < > 0.63  CALCIUM 8.0*  < > 8.3*  MG 1.5*  < > 1.5*  AST 28  --   --   ALT 19  --   --   ALKPHOS 44  --   --   BILITOT 1.3*  --   --   < > = values in this interval not displayed. Cardiac Enzymes No results for input(s): TROPONINI in the last 168 hours. RADIOLOGY:  No results found. ASSESSMENT AND PLAN:  56 yo male with no past medical history presented to the ED with abdominal pain and non-bloody emesis. Admitted for persistent tachycardia.  1. SBO/ileus; resolved.very sleepy.not eating much,was on tpn before but stopped.On IV fluids. Nutritionist recommended NG tube but patient is very agitated ,NG tube is  not an option because of agitation and he will likley pull it  Out.  2. Alcohol abuse/withdrawal- he here since February 8.  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 4. Hypokalemia/Hypomagnesemia -replete and recheck  5. Nutrition -stopped  TPN ,advanced diet .but pt is not eating well.Daily calorie count.  6. Fever of unknown origin - source unclear.  Tylenol given.  Remains afebrile overnight.   - UA was (-), CXR (-).  Blood cultures (-) so far.  We will stop Zosyn today as he has been afebrile   Pt's prognosis is poor.   Case discussed with Care Management/Social Worker. Management plans discussed with the patient, family, Dr. Excell Seltzer and  they are in agreement.  CODE STATUS: full  DVT Prophylaxis: Ted's & SCD's  TOTAL TIME TAKING CARE OF THIS PATIENT: 30 minutes.    Note: This dictation was prepared with Dragon dictation along with smaller phrase technology. Any transcriptional errors that result from this process are unintentional.  Shailee Foots M.D on 06/09/2016 at 11:31 AM  Between 7am to 6pm - Pager - [phone]  After 6pm go to [email or web address] - Social research officer, government  [facility]  Office  [phone]  CC: Primary care physician; No PCP Per Patient

## 2016-06-09 NOTE — Consult Note (Signed)
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 Alcohol abuse [F10.10] 06/08/2016  . Abdominal pain of unknown etiology [R10.9]   . Hyponatremia [E87.1]   . SBO (small bowel obstruction) [K56.609]   . Ileus ([place]) [K56.7]   . Tachycardia [R00.0] 05/25/2016    Total Time spent with patient: 15 minutes  Subjective:   Willie Baker is a 56 y.o. male patient admitted with "this is a service station". Follow-up for Friday the 23rd. Patient scenes. Spoke with his girlfriend as well. Patient was awake and responsive but very confused. Could not tell me who his girlfriend was. Couldn't tell me where he was. He was not agitated or threatening. Short-term memory nonexistent. Does not appear to be agitated currently.  HPI:  Patient interviewed. Chart reviewed. Spoke with nurses. 56 year old man who has been in the hospital now for a couple of weeks. Treatment in the hospital for small bowel obstruction and also for presumed alcohol withdrawal. He remains confused and at times agitated. Patient himself was pleasant and cooperative with the interview with me but gave no real useful information. He thought that he was in a service station. Did not know the year and did not know his own age. Had no idea why he might be in a hospital. He rambled some stories about things he had done in the past and about his job. Said that he had continued to drink recently but that he thought he had cut way back on it. Was able to tell me that he lived in [place] with his girlfriend. Nurses report that for the  most part he is relatively quiet right now but that when they tried to do any kind of treatment with him or touch his body he rapidly gets agitated and at times strikes out and will curse. Observation the patient is not tremulous at all. He is confused but was able to stay attentive to me and hold a conversation with a little bit of internal coherency. Tachycardic but blood pressure stable.  Social history: Apparently he does live with his girlfriend and I'm told the girlfriend is the main person who visits him. 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  Medical history: Main medical problem here apparently was a small bowel obstruction and ileus.  Substance abuse history: Appears to have a long-standing history of alcohol abuse. Patient was not able to tell me about any treatment he had had in the past. Denied using any other drugs.    Past Psychiatric History: As far as I can tell there is no past significant psychiatric history other than what might be related to alcohol withdrawal. No known history of psychotic disorder or suicide or violence  Risk to Self: Is patient at risk for suicide?: No Risk to Others:   Prior Inpatient Therapy:   Prior Outpatient Therapy:    Past Medical History: History reviewed. No pertinent past medical history.  Past Surgical History:  Procedure Laterality Date  . FACIAL RECONSTRUCTION SURGERY     Family History:  Family History  Problem Relation Age of Onset  . Breast cancer Mother  Family Psychiatric  History: Unknown Social History:  History  Alcohol Use  . Yes     History  Drug Use No    Social History   Social History  . Marital status: Single    Spouse name: N/A  . Number of children: N/A  . Years of education: N/A   Social History Main Topics  . Smoking status: Current Every Day Smoker  . Smokeless tobacco: Never Used  . Alcohol use Yes  . Drug use: No  . Sexual activity: Not Asked   Other Topics Concern  .  None   Social History Narrative  . None   Additional Social History:    Allergies:   Allergies  Allergen Reactions  . Milk-Related Compounds     Labs:  Results for orders placed or performed during the hospital encounter of 05/24/16 (from the past 48 hour(s))  Glucose, capillary     Status: None   Collection Time: 06/07/16  5:10 PM  Result Value Ref Range   Glucose-Capillary 83 65 - 99 mg/dL  Osmolality, urine     Status: None   Collection Time: 06/07/16  5:20 PM  Result Value Ref Range   Osmolality, Ur 629 300 - 900 mOsm/kg  Osmolality     Status: Abnormal   Collection Time: 06/07/16  7:00 PM  Result Value Ref Range   Osmolality 255 (L) 275 - 295 mOsm/kg  Glucose, capillary     Status: None   Collection Time: 06/07/16  8:35 PM  Result Value Ref Range   Glucose-Capillary 81 65 - 99 mg/dL  Basic metabolic panel     Status: Abnormal   Collection Time: 06/08/16  5:00 AM  Result Value Ref Range   Sodium 125 (L) 135 - 145 mmol/L   Potassium 3.4 (L) 3.5 - 5.1 mmol/L   Chloride 95 (L) 101 - 111 mmol/L   CO2 20 (L) 22 - 32 mmol/L   Glucose, Bld 82 65 - 99 mg/dL   BUN 7 6 - 20 mg/dL   Creatinine, Ser 0.43 (L) 0.61 - 1.24 mg/dL   Calcium 7.8 (L) 8.9 - 10.3 mg/dL   GFR calc non Af Amer >60 >60 mL/min   GFR calc Af Amer >60 >60 mL/min    Comment: (NOTE) The eGFR has been calculated using the CKD EPI equation. This calculation has not been validated in all clinical situations. eGFR's persistently <60 mL/min signify possible Chronic Kidney Disease.    Anion gap 10 5 - 15  Magnesium     Status: Abnormal   Collection Time: 06/08/16  5:00 AM  Result Value Ref Range   Magnesium 1.5 (L) 1.7 - 2.4 mg/dL  Glucose, capillary     Status: None   Collection Time: 06/08/16  7:27 AM  Result Value Ref Range   Glucose-Capillary 77 65 - 99 mg/dL  Glucose, capillary     Status: None   Collection Time: 06/08/16 11:38 AM  Result Value Ref Range   Glucose-Capillary 77 65 - 99 mg/dL   Ammonia     Status: None   Collection Time: 06/08/16  2:00 PM  Result Value Ref Range   Ammonia 14 9 - 35 umol/L  Glucose, capillary     Status: None   Collection Time: 06/08/16  4:40 PM  Result Value Ref Range   Glucose-Capillary 75 65 - 99 mg/dL  Glucose, capillary     Status: None   Collection Time: 06/08/16  8:52 PM  Result Value Ref Range  Glucose-Capillary 77 65 - 99 mg/dL  Basic metabolic panel     Status: Abnormal   Collection Time: 06/09/16  5:15 AM  Result Value Ref Range   Sodium 133 (L) 135 - 145 mmol/L   Potassium 3.3 (L) 3.5 - 5.1 mmol/L   Chloride 102 101 - 111 mmol/L   CO2 26 22 - 32 mmol/L   Glucose, Bld 99 65 - 99 mg/dL   BUN <5 (L) 6 - 20 mg/dL   Creatinine, Ser 0.63 0.61 - 1.24 mg/dL   Calcium 8.3 (L) 8.9 - 10.3 mg/dL   GFR calc non Af Amer >60 >60 mL/min   GFR calc Af Amer >60 >60 mL/min    Comment: (NOTE) The eGFR has been calculated using the CKD EPI equation. This calculation has not been validated in all clinical situations. eGFR's persistently <60 mL/min signify possible Chronic Kidney Disease.    Anion gap 5 5 - 15  Magnesium     Status: Abnormal   Collection Time: 06/09/16  5:15 AM  Result Value Ref Range   Magnesium 1.5 (L) 1.7 - 2.4 mg/dL  Phosphorus     Status: None   Collection Time: 06/09/16  5:15 AM  Result Value Ref Range   Phosphorus 4.1 2.5 - 4.6 mg/dL  Glucose, capillary     Status: None   Collection Time: 06/09/16  7:48 AM  Result Value Ref Range   Glucose-Capillary 93 65 - 99 mg/dL   Comment 1 Notify RN     Current Facility-Administered Medications  Medication Dose Route Frequency Provider Last Rate Last Dose  . 0.9 %  sodium chloride infusion   Intravenous Continuous Gladstone Lighter, MD 75 mL/hr at 06/09/16 0307    . acetaminophen (TYLENOL) tablet 650 mg  650 mg Oral Q6H PRN Harrie Foreman, MD   650 mg at 05/25/16 1935   Or  . acetaminophen (TYLENOL) suppository 650 mg  650 mg Rectal Q6H PRN Harrie Foreman, MD    650 mg at 06/03/16 1349  . docusate sodium (COLACE) capsule 100 mg  100 mg Oral BID Harrie Foreman, MD   100 mg at 06/08/16 1123  . feeding supplement (ENSURE ENLIVE) (ENSURE ENLIVE) liquid 237 mL  237 mL Oral TID BM Epifanio Lesches, MD      . folic acid injection 1 mg  1 mg Intravenous Daily Epifanio Lesches, MD   1 mg at 06/09/16 1223  . insulin aspart (novoLOG) injection 0-9 Units  0-9 Units Subcutaneous TID AC & HS Alexis Hugelmeyer, DO      . LORazepam (ATIVAN) injection 1 mg  1 mg Intravenous Q4H PRN Epifanio Lesches, MD   1 mg at 06/09/16 1556  . magnesium sulfate IVPB 2 g 50 mL  2 g Intravenous Once Epifanio Lesches, MD   2 g at 06/09/16 1556  . MEDLINE mouth rinse  15 mL Mouth Rinse BID Henreitta Leber, MD   15 mL at 06/06/16 0924  . metoprolol (LOPRESSOR) injection 5 mg  5 mg Intravenous Q6H PRN Lance Coon, MD   5 mg at 06/07/16 1943  . nicotine (NICODERM CQ - dosed in mg/24 hr) patch 14 mg  14 mg Transdermal Daily Lance Coon, MD   14 mg at 06/09/16 0510  . ondansetron (ZOFRAN) tablet 4 mg  4 mg Oral Q6H PRN Harrie Foreman, MD       Or  . ondansetron [facility]) injection 4 mg  4 mg Intravenous Q6H PRN Harrie Foreman,  MD      . pantoprazole (PROTONIX) EC tablet 40 mg  40 mg Oral QAC breakfast Max Sane, MD   40 mg at 06/08/16 1123  . risperiDONE (RISPERDAL M-TABS) disintegrating tablet 0.5 mg  0.5 mg Oral BID Gonzella Lex, MD      . sodium chloride flush (NS) 0.9 % injection 3 mL  3 mL Intravenous Q12H Harrie Foreman, MD   3 mL at 06/09/16 0954  . thiamine (VITAMIN B-1) tablet 100 mg  100 mg Oral Daily Harrie Foreman, MD   100 mg at 06/08/16 1124   Or  . thiamine (B-1) injection 100 mg  100 mg Intravenous Daily Harrie Foreman, MD   100 mg at 06/07/16 3007  . tiotropium (SPIRIVA) inhalation capsule 18 mcg  18 mcg Inhalation Daily Harrie Foreman, MD   18 mcg at 05/25/16 6226    Musculoskeletal: Strength & Muscle Tone: decreased Gait & Station:  unable to stand Patient leans: Backward  Psychiatric Specialty Exam: Physical Exam  Nursing note and vitals reviewed. Constitutional: He appears well-developed.  HENT:  Head: Normocephalic and atraumatic.  Eyes: Conjunctivae are normal. Pupils are equal, round, and reactive to light.  Neck: Normal range of motion.  Cardiovascular: Regular rhythm and normal heart sounds.   Respiratory: Effort normal. No respiratory distress.  GI: Soft.  Musculoskeletal: Normal range of motion.  Neurological: He is alert.  Skin: Skin is warm and dry.  Psychiatric: His affect is blunt. His speech is tangential. He is slowed. He is not aggressive, not hyperactive and not combative. Thought content is delusional. Thought content is not paranoid. Cognition and memory are impaired. He expresses impulsivity. He expresses no homicidal and no suicidal ideation. He is communicative. He exhibits abnormal recent memory.    Review of Systems  Constitutional: Negative.   HENT: Negative.   Eyes: Negative.   Respiratory: Negative.   Cardiovascular: Negative.   Gastrointestinal: Negative.   Musculoskeletal: Positive for myalgias.  Skin: Negative.   Neurological: Negative.   Psychiatric/Behavioral: Negative for depression, hallucinations, memory loss, substance abuse and suicidal ideas. The patient is not nervous/anxious and does not have insomnia.     Blood pressure 110/67, pulse (!) 115, temperature 98.4 F (36.9 C), resp. rate 20, height 6' 1"  (1.854 m), weight 66.3 kg (146 lb 1.6 oz), SpO2 98 %.Body mass index is 19.28 kg/m.  General Appearance: Casual  Eye Contact:  Fair  Speech:  Slow  Volume:  Decreased  Mood:  Euthymic  Affect:  Constricted  Thought Process:  Disorganized and Irrelevant  Orientation:  Full (Time, Place, and Person)  Thought Content:  Delusions and Tangential  Suicidal Thoughts:  No  Homicidal Thoughts:  No  Memory:  Immediate;   Fair Recent;   Poor Remote;   Poor  Judgement:   Impaired  Insight:  Lacking  Psychomotor Activity:  Decreased  Concentration:  Concentration: Poor  Recall:  Poor  Fund of Knowledge:  Fair  Language:  Fair  Akathisia:  No  Handed:  Right  AIMS (if indicated):     Assets:  Housing  ADL's:  Impaired  Cognition:  Impaired,  Mild and Moderate  Sleep:        Treatment Plan Summary: Daily contact with patient to assess and evaluate symptoms and progress in treatment, Medication management and Plan MRI reviewed. Not a great scan but still didn't have any specific finding . I still don't think that this is probably DTs but is rather  a persisting cognitive impairment. I would continue suggesting modest doses of antipsychotics for agitation no other specific medicine at this point. I spoke with his girlfriend and advised her that it was unclear how much he would continue to improve but if we found something treatable we would definitely want to address that. Tried to make it clear that he may need total care into the future as it's unclear if this condition is going to recover any farther.  Disposition: Patient does not meet criteria for psychiatric inpatient admission. Supportive therapy provided about ongoing stressors.  Alethia Berthold, MD 06/09/2016 4:00 PM

## 2016-06-09 NOTE — Progress Notes (Signed)
Pt refused all medications this morning, pt is being verbally abusive to staff, he was escalating to becoming physically abusive. Nurse gave him a prn dose of ativan, nurse techs changed his gown and added a blanket. MRI notified that the doctor would like to try the mri once more after he has his one time dose of ativan 2mg .

## 2016-06-10 LAB — BASIC METABOLIC PANEL
ANION GAP: 7 (ref 5–15)
BUN: <5 mg/dL — ABNORMAL LOW (ref 6–20)
CO2: 24 mmol/L (ref 22–32)
CREATININE: 0.43 mg/dL — AB (ref 0.61–1.24)
Calcium: 8.2 mg/dL — ABNORMAL LOW (ref 8.9–10.3)
Chloride: 103 mmol/L (ref 101–111)
GLUCOSE: 105 mg/dL — AB (ref 65–99)
Potassium: 3.4 mmol/L — ABNORMAL LOW (ref 3.5–5.1)
SODIUM: 134 mmol/L — AB (ref 135–145)

## 2016-06-10 LAB — GLUCOSE, CAPILLARY
Glucose-Capillary: 80 mg/dL (ref 65–99)
Glucose-Capillary: 88 mg/dL (ref 65–99)
Glucose-Capillary: 111 mg/dL — ABNORMAL HIGH (ref 65–99)
Glucose-Capillary: 107 mg/dL — ABNORMAL HIGH (ref 65–99)

## 2016-06-10 MED ORDER — TRAZODONE HCL 50 MG PO TABS
150.0000 mg | ORAL_TABLET | Freq: Every day | ORAL | Status: DC
  Administered 2016-06-12 – 2016-06-20 (×7): 150 mg via ORAL
  Filled 2016-06-10 (×11): qty 1

## 2016-06-10 MED ORDER — POTASSIUM CHLORIDE CRYS ER 20 MEQ PO TBCR
20.0000 meq | EXTENDED_RELEASE_TABLET | Freq: Once | ORAL | Status: AC
Start: 2016-06-10 — End: 2016-06-10
  Administered 2016-06-10: 20 meq via ORAL
  Filled 2016-06-10: qty 1

## 2016-06-10 MED ORDER — MEGESTROL ACETATE 400 MG/10ML PO SUSP
400.0000 mg | Freq: Every day | ORAL | Status: DC
  Administered 2016-06-10 – 2016-06-20 (×8): 400 mg via ORAL
  Filled 2016-06-10 (×10): qty 10

## 2016-06-10 MED ORDER — HALOPERIDOL LACTATE 5 MG/ML IJ SOLN
2.0000 mg | Freq: Once | INTRAMUSCULAR | Status: AC
  Administered 2016-06-10: 2 mg via INTRAVENOUS
  Filled 2016-06-10: qty 1

## 2016-06-10 MED ORDER — SODIUM CHLORIDE 0.9 % IV SOLN
1.0000 mg | Freq: Every day | INTRAVENOUS | Status: DC
  Administered 2016-06-10 – 2016-06-19 (×10): 1 mg via INTRAVENOUS
  Filled 2016-06-10 (×11): qty 0.2

## 2016-06-10 NOTE — Progress Notes (Signed)
Notified hospitalist (Dr Nemiah CommanderKalisetti) of pt agitation, and that pt could not receive any more prn meds at this time. Dr stated she would put in orders

## 2016-06-10 NOTE — Plan of Care (Signed)
Problem: Education: Goal: Knowledge of [facility] Education information/materials will improve Outcome: Not Progressing Pt confused  Problem: Health Behavior/Discharge Planning: Goal: Ability to manage health-related needs will improve Outcome: Not Progressing Pt requires assistance  Problem: Education: Goal: Knowledge of disease or condition will improve Outcome: Not Progressing Pt confused Goal: Understanding of discharge needs will improve Outcome: Not Progressing Pt confused  Problem: Health Behavior/Discharge Planning: Goal: Ability to identify changes in lifestyle to reduce recurrence of condition will improve Outcome: Not Progressing Pt confused Goal: Identification of resources available to assist in meeting health care needs will improve Outcome: Not Progressing Pt confused

## 2016-06-10 NOTE — NC FL2 (Signed)
[place] MEDICAID FL2 LEVEL OF CARE SCREENING TOOL     IDENTIFICATION  Patient Name: Willie Baker Birthdate: [birth year] Sex: male Admission Date (Current Location): 05/24/2016  [place] and [place] Number:  Chiropodist and Address:  [facility], [address] [place], [place] [address]      Provider Number: [id number]  Attending Physician Name and Address:  Auburn Bilberry, MD  Relative Name and Phone Number:       Current Level of Care: Hospital Recommended Level of Care: Skilled Nursing Facility Prior Approval Number:    Date Approved/Denied: 06/10/16 PASRR Number: [id number]  Discharge Plan: SNF    Current Diagnoses: Patient Active Problem List   Diagnosis Date Noted  . 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 Alcohol abuse 06/08/2016  . Abdominal pain of unknown etiology   . Hyponatremia   . SBO (small bowel obstruction)   . Ileus ([facility])   . Tachycardia 05/25/2016    Orientation RESPIRATION BLADDER Height & Weight     Self, Time, Situation, Place  Normal Continent Weight: 150 lb (68 kg) Height:  6\' 1"  (185.4 cm)  BEHAVIORAL SYMPTOMS/MOOD NEUROLOGICAL BOWEL NUTRITION STATUS      Continent    AMBULATORY STATUS COMMUNICATION OF NEEDS Skin   Extensive Assist Verbally Normal                       Personal Care Assistance Level of Assistance  Bathing, Feeding, Dressing Bathing Assistance: Limited assistance Feeding assistance: Independent Dressing Assistance: Limited assistance     Functional Limitations Info             SPECIAL CARE FACTORS FREQUENCY  PT (By licensed PT)     PT Frequency: Up to 5X per day, 5 days per week              Contractures Contractures Info: Present    Additional Factors Info  Code Status, Allergies Code Status Info: Full Allergies Info: Milk related compounds           Current Medications (06/10/2016):  This is the current hospital active  medication list Current Facility-Administered Medications  Medication Dose Route Frequency Provider Last Rate Last Dose  . 0.9 %  sodium chloride infusion   Intravenous Continuous Enid Baas, MD 75 mL/hr at 06/10/16 1511    . acetaminophen (TYLENOL) tablet 650 mg  650 mg Oral Q6H PRN Arnaldo Natal, MD   650 mg at 05/25/16 1935   Or  . acetaminophen (TYLENOL) suppository 650 mg  650 mg Rectal Q6H PRN Arnaldo Natal, MD   650 mg at 06/03/16 1349  . docusate sodium (COLACE) capsule 100 mg  100 mg Oral BID Arnaldo Natal, MD   100 mg at 06/10/16 1029  . feeding supplement (ENSURE ENLIVE) (ENSURE ENLIVE) liquid 237 mL  237 mL Oral TID BM Katha Hamming, MD   237 mL at 06/10/16 1034  . folic acid 1 mg in sodium chloride 0.9 % 50 mL IVPB  1 mg Intravenous Daily Cindi Carbon, RPH   1 mg at 06/10/16 1325  . haloperidol lactate (HALDOL) injection 2 mg  2 mg Intravenous Q6H PRN Alexis Hugelmeyer, DO   2 mg at 06/10/16 1508  . insulin aspart (novoLOG) injection 0-9 Units  0-9 Units Subcutaneous TID AC & HS Alexis Hugelmeyer, DO      . LORazepam (ATIVAN) injection 1 mg  1 mg Intravenous Q4H PRN Katha Hamming,  MD   1 mg at 06/10/16 0438  . MEDLINE mouth rinse  15 mL Mouth Rinse BID Houston SirenVivek J Sainani, MD   15 mL at 06/10/16 1034  . megestrol (MEGACE) 400 MG/10ML suspension 400 mg  400 mg Oral Daily Auburn BilberryShreyang [name], MD      . metoprolol (LOPRESSOR) injection 5 mg  5 mg Intravenous Q6H PRN Oralia Manisavid Willis, MD   5 mg at 06/07/16 1943  . nicotine (NICODERM CQ - dosed in mg/24 hr) patch 14 mg  14 mg Transdermal Daily Oralia Manisavid Willis, MD   14 mg at 06/10/16 [id number]  . ondansetron (ZOFRAN) tablet 4 mg  4 mg Oral Q6H PRN Arnaldo NatalMichael S Diamond, MD       Or  . ondansetron [facility](ZOFRAN) injection 4 mg  4 mg Intravenous Q6H PRN Arnaldo NatalMichael S Diamond, MD      . pantoprazole (PROTONIX) EC tablet 40 mg  40 mg Oral QAC breakfast Delfino LovettVipul Shah, MD   40 mg at 06/10/16 1030  . risperiDONE (RISPERDAL M-TABS) disintegrating  tablet 0.5 mg  0.5 mg Oral BID Audery AmelJohn T Clapacs, MD   0.5 mg at 06/10/16 1029  . sodium chloride flush (NS) 0.9 % injection 3 mL  3 mL Intravenous Q12H Arnaldo NatalMichael S Diamond, MD   3 mL at 06/10/16 1032  . thiamine (VITAMIN B-1) tablet 100 mg  100 mg Oral Daily Arnaldo NatalMichael S Diamond, MD   100 mg at 06/10/16 1029   Or  . thiamine (B-1) injection 100 mg  100 mg Intravenous Daily Arnaldo NatalMichael S Diamond, MD   100 mg at 06/07/16 [id number]  . tiotropium (SPIRIVA) inhalation capsule 18 mcg  18 mcg Inhalation Daily Arnaldo NatalMichael S Diamond, MD   18 mcg at 06/10/16 1032  . traZODone (DESYREL) tablet 150 mg  150 mg Oral QHS Auburn BilberryShreyang [name], MD         Discharge Medications: Please see discharge summary for a list of discharge medications.  Relevant Imaging Results:  Relevant Lab Results:   Additional Information SS # [id number]  Judi CongKaren M Sakai Wolford, LCSW

## 2016-06-10 NOTE — Progress Notes (Signed)
[facility] [place] at [facility]   PATIENT NAME: Willie Baker    MR#:  [id number]  DATE OF BIRTH:  03/13/1961  SUBJECTIVE:   Patient is little confused. According to his significant other not eating or drinking much   REVIEW OF SYSTEMS:   Review of Systems  Unable to perform ROS: Acuity of condition  Constitutional: Negative for chills, fever and weight loss.  HENT: Negative for nosebleeds and sore throat.   Eyes: Negative for blurred vision.  Respiratory: Negative for cough, shortness of breath and wheezing.   Cardiovascular: Negative for chest pain, orthopnea, leg swelling and PND.  Gastrointestinal: Negative for abdominal pain, constipation, diarrhea, heartburn, nausea and vomiting.  Genitourinary: Negative for dysuria and urgency.  Musculoskeletal: Negative for back pain.  Skin: Negative for rash.  Neurological: Negative for dizziness, speech change, focal weakness and headaches.  Endo/Heme/Allergies: Does not bruise/bleed easily.  Psychiatric/Behavioral: Negative for depression.   Tolerating Diet: Yes  DRUG ALLERGIES:   Allergies  Allergen Reactions  . Milk-Related Compounds     VITALS:  Blood pressure 114/81, pulse (!) 127, temperature 98 F (36.7 C), temperature source Oral, resp. rate 19, height 6\' 1"  (1.854 m), weight 150 lb (68 kg), SpO2 97 %.  PHYSICAL EXAMINATION:   Physical Exam  GENERAL:  56 y.o.-year-old patient lying in  bed awake .  EYES: Pupils equal, round, reactive to light. No scleral icterus.   HEENT: Head atraumatic, normocephalic. Oropharynx and nasopharynx clear. Dry Oral Mucosa.   NECK:  Supple, no jugular venous distention. No thyroid enlargement, no tenderness.  LUNGS: Normal breath sounds bilaterally, no wheezing, rales, upper airway rhonchi. No use of accessory muscles of respiration.  CARDIOVASCULAR: S1, S2 normal. No murmurs, rubs, or gallops. tachycardia ABDOMEN: Soft, nontender, non-distended, + BS. No  organomegaly or mass.  EXTREMITIES: No cyanosis, clubbing or edema b/l.    NEUROLOGIC: Unable to do neuro exam because  He is  Confused. PSYCHIATRIC:  patient is awake but  confused SKIN: No obvious rash, lesion, or ulcer.  LABORATORY PANEL:  CBC  Recent Labs Lab 06/07/16 0450  WBC 10.0  HGB 10.8*  HCT 30.8*  PLT 231    Chemistries   Recent Labs Lab 06/04/16 0548  06/09/16 1553 06/10/16 0456  NA 128*  < >  --  134*  K 2.9*  < >  --  3.4*  CL 97*  < >  --  103  CO2 25  < >  --  24  GLUCOSE 130*  < >  --  105*  BUN 5*  < >  --  <5*  CREATININE 0.43*  < >  --  0.43*  CALCIUM 8.0*  < >  --  8.2*  MG 1.5*  < > 1.4*  --   AST 28  --   --   --   ALT 19  --   --   --   ALKPHOS 44  --   --   --   BILITOT 1.3*  --   --   --   < > = values in this interval not displayed. Cardiac Enzymes No results for input(s): TROPONINI in the last 168 hours. RADIOLOGY:  Mr Brain Wo Contrast  Result Date: 06/09/2016 CLINICAL DATA:  Altered mental status and confusion. Alcohol withdrawal. EXAM: MRI HEAD WITHOUT CONTRAST TECHNIQUE: Multiplanar, multiecho pulse sequences of the brain and surrounding structures were obtained without intravenous contrast. COMPARISON:  Head CT 09/10/2015 FINDINGS: Brain: Intermittently motion  degraded exam. No acute infarct, hemorrhage, hydrocephalus, or mass. FLAIR signal at the level of the cerebral aqua duct is likely artifactual. No signal abnormality noted in the deep gray nuclei to increase concern for Wernicke's. The mamillary bodies are not well covered. No signs of osmotic demyelination. Generalized atrophy that is age advanced. No chronic blood products noted. Vascular: Preserved flow voids Skull and upper cervical spine: No marrow lesion noted. Sinuses/Orbits: No acute finding IMPRESSION: 1. No acute finding. 2. 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 Electronically Signed   By: Marnee Spring M.D.   On: 06/09/2016 12:04   ASSESSMENT AND PLAN:  56 yo male with no past medical  history presented to the ED with abdominal pain and non-bloody emesis. Admitted for persistent tachycardia.  1. SBO/ileus; resolved.Add Megace for appetite stimulant  2. Alcohol abuse/withdrawal- he here since February 8.  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 4. Hypokalemia/Hypomagnesemia -replete and recheck  5. Nutrition -I will add Megace t.  6. Fever of unknown origin - remains afebrile  - UA was (-), CXR (-).  Blood cultures (-) so far.  We will stop Zosyn today as he has been afebrile   Pt's prognosis is poor.   Case discussed with Care Management/Social Worker. Management plans discussed with the patient, family, Dr. Excell Seltzer and they are in agreement.  CODE STATUS: full  DVT Prophylaxis: Ted's & SCD's  TOTAL TIME TAKING CARE OF THIS PATIENT: 30 minutes.    Note: This dictation was prepared with Dragon dictation along with smaller phrase technology. Any transcriptional errors that result from this process are unintentional.  Auburn Bilberry M.D on 06/10/2016 at 1:06 PM  Between 7am to 6pm - Pager - [phone]  After 6pm go to [email or web address] - Social research officer, government  [facility] [place] [facility]  Office  [phone]  CC: Primary care physician; No PCP Per Patient

## 2016-06-10 NOTE — Plan of Care (Signed)
Problem: Education: Goal: Knowledge of [facility] Education information/materials will improve Outcome: Progressing Continued agitation, combativeness, disorientation.  Received PRN IV Ativan x2.  Tele sitter at bedside, bed in low position, bed alarm on.  WCTM.

## 2016-06-10 NOTE — Progress Notes (Signed)
MEDICATION RELATED CONSULT NOTE   Pharmacy Consult for Electrolyte monitoring Indication: hypokalemia, hypomagnesia  Allergies  Allergen Reactions  . Milk-Related Compounds     Recent Labs  06/08/16 0500 06/09/16 0515 06/09/16 1553 06/10/16 0456  CREATININE 0.43* 0.63  --  0.43*  MG 1.5* 1.5* 1.4*  --   PHOS  --  4.1  --   --    Lab Results  Component Value Date   K 3.4 (L) 06/10/2016   Estimated Creatinine Clearance: 101.5 mL/min (by C-G formula based on SCr of 0.43 mg/dL (L)).   Assessment: 56 yo M w/ partial SBO/ DTs. Patient was on TPN that was discontinued 2/20. Hx ETOH. Patient with poor po intake.  2/24  K=3.4.    Plan:  Ordered KCl 20meq PO once.  Follow up on AM labs.  Stormy CardKatsoudas,Jordanne Elsbury K, [facility] HospitalRPH Clinical Pharmacist 06/10/2016 7:37 AM

## 2016-06-10 NOTE — Clinical Social Work Note (Signed)
Clinical Social Work Assessment  Patient Details  Name: Willie Baker MRN: [id number] Date of Birth: [birth year]  Date of referral:  06/10/16               Reason for consult:  Facility Placement                Permission sought to share information with:  Chartered certified accountant granted to share information::  Yes, Verbal Permission Granted  Name::        Agency::     Relationship::     Contact Information:     Housing/Transportation Living arrangements for the past 2 months:  Single Family Home Source of Information:  Partner Patient Interpreter Needed:  None Criminal Activity/Legal Involvement Pertinent to Current Situation/Hospitalization:  No - Comment as needed Significant Relationships:  Significant Other, Other Family Members Lives with:  Significant Other Do you feel safe going back to the place where you live?  Yes Need for family participation in patient care:  No (Coment)  Care giving concerns:  PT recommendation for SNF. Patient has no insurance.   Social Worker assessment / plan:  The CSW met with the patient and his significant other Willie Baker at bedside. As the patient was not alert or oriented at the time, the information was gleaned from [facility] The patient lives with Willie Baker in a single family home. Willie Baker is aware of the SNF recommendation, and she is in agreement. She does want to discuss any bed offers with the family, and she would prefer to stay within [facility] if possible. The patient's Medicaid application was filled out yesterday.   CSW contacted leadership about the possibility of an LOG. According to [place] will not be issued. CSW will con't to follow for alternative dc planning.   Employment status:  Retired Forensic scientist:  Self Pay (Medicaid Pending) PT Recommendations:  [place] / Referral to community resources:  [place]  Patient/Family's Response to care:   The patient's SO thanked the CSW for assistance.  Patient/Family's Understanding of and Emotional Response to Diagnosis, Current Treatment, and Prognosis:  The family is aware of needs but has limited insight into the insurance barriers.  Emotional Assessment Appearance:  Appears older than stated age Attitude/Demeanor/Rapport:  Lethargic Affect (typically observed):  Constricted Orientation:  Oriented to Self Alcohol / Substance use:  Alcohol Use Psych involvement (Current and /or in the community):  Yes (Comment)  Discharge Needs  Concerns to be addressed:  Substance Abuse Concerns, Care Coordination, Financial / Insurance Concerns Readmission within the last 30 days:  No Current discharge risk:  Substance Abuse, Inadequate Financial Supports Barriers to Discharge:  Continued Medical Work up, Programmer, applications ([place])   Zettie Pho, LCSW 06/10/2016, 2:28 PM

## 2016-06-11 LAB — BASIC METABOLIC PANEL
Anion gap: 6 (ref 5–15)
CALCIUM: 8.3 mg/dL — AB (ref 8.9–10.3)
CO2: 25 mmol/L (ref 22–32)
CREATININE: 0.41 mg/dL — AB (ref 0.61–1.24)
Chloride: 105 mmol/L (ref 101–111)
GFR calc Af Amer: >60 mL/min (ref >60)
Glucose, Bld: 112 mg/dL — ABNORMAL HIGH (ref 65–99)
Potassium: 3.2 mmol/L — ABNORMAL LOW (ref 3.5–5.1)
SODIUM: 136 mmol/L (ref 135–145)

## 2016-06-11 LAB — MAGNESIUM: MAGNESIUM: 1.3 mg/dL — AB (ref 1.7–2.4)

## 2016-06-11 LAB — GLUCOSE, CAPILLARY
Glucose-Capillary: 92 mg/dL (ref 65–99)
Glucose-Capillary: 98 mg/dL (ref 65–99)
Glucose-Capillary: 86 mg/dL (ref 65–99)
Glucose-Capillary: 93 mg/dL (ref 65–99)

## 2016-06-11 LAB — POTASSIUM: Potassium: 3.2 mmol/L — ABNORMAL LOW (ref 3.5–5.1)

## 2016-06-11 MED ORDER — DIPHENHYDRAMINE HCL 50 MG/ML IJ SOLN
INTRAMUSCULAR | Status: AC
  Administered 2016-06-11: 12.5 mg via INTRAVENOUS
  Filled 2016-06-11: qty 1

## 2016-06-11 MED ORDER — POTASSIUM CHLORIDE 20 MEQ PO PACK: 40.0000 meq | PACK | Freq: Once | ORAL | Status: DC

## 2016-06-11 MED ORDER — DIPHENHYDRAMINE HCL 50 MG/ML IJ SOLN
12.5000 mg | Freq: Once | INTRAMUSCULAR | Status: AC
Start: 2016-06-11 — End: 2016-06-11
  Administered 2016-06-11: 12.5 mg via INTRAVENOUS

## 2016-06-11 MED ORDER — SODIUM CHLORIDE 0.9 % IV SOLN
30.0000 meq | Freq: Once | INTRAVENOUS | Status: AC
  Administered 2016-06-11: 30 meq via INTRAVENOUS
  Filled 2016-06-11: qty 15

## 2016-06-11 MED ORDER — MAGNESIUM SULFATE 4 GM/100ML IV SOLN
4.0000 g | Freq: Once | INTRAVENOUS | Status: AC
  Administered 2016-06-11: 4 g via INTRAVENOUS
  Filled 2016-06-11: qty 100

## 2016-06-11 MED ORDER — LORAZEPAM 2 MG/ML IJ SOLN
2.0000 mg | INTRAMUSCULAR | Status: DC | PRN
  Administered 2016-06-11 – 2016-06-21 (×27): 2 mg via INTRAVENOUS
  Filled 2016-06-11 (×29): qty 1

## 2016-06-11 MED ORDER — HALOPERIDOL LACTATE 5 MG/ML IJ SOLN
2.0000 mg | INTRAMUSCULAR | Status: DC | PRN
  Administered 2016-06-11 – 2016-06-13 (×6): 2 mg via INTRAVENOUS
  Filled 2016-06-11 (×6): qty 1

## 2016-06-11 MED ORDER — POTASSIUM CHLORIDE 20 MEQ PO PACK
20.0000 meq | PACK | ORAL | Status: AC
  Filled 2016-06-11: qty 1

## 2016-06-11 NOTE — Plan of Care (Signed)
Problem: Safety: Goal: Ability to remain free from injury will improve Outcome: Not Progressing Pt continues to be agitated and trying to get out of bed.  Telesitter only effective as far as letting us know when he was throwing his legs over the rail.  Meds for agitation made  More frequent.

## 2016-06-11 NOTE — Plan of Care (Signed)
Problem: Education: Goal: Knowledge of [facility] Education information/materials will improve Outcome: Not Progressing Pt confused  Problem: Safety: Goal: Ability to remain free from injury will improve Outcome: Progressing Pt needs assistance  Problem: Health Behavior/Discharge Planning: Goal: Ability to manage health-related needs will improve Outcome: Not Progressing Pt confused  Problem: Activity: Goal: Risk for activity intolerance will decrease Outcome: Progressing Pt needs assistance  Problem: Nutrition: Goal: Adequate nutrition will be maintained Outcome: Progressing Pt was able to drink his ensure this shift  Problem: Education: Goal: Understanding of discharge needs will improve Outcome: Not Progressing Pt confused

## 2016-06-11 NOTE — Progress Notes (Signed)
MEDICATION RELATED CONSULT NOTE   Pharmacy Consult for Electrolyte monitoring Indication: hypokalemia, hypomagnesia  Allergies  Allergen Reactions  . Milk-Related Compounds     Recent Labs  06/09/16 0515 06/09/16 1553 06/10/16 0456 06/11/16 0548  CREATININE 0.63  --  0.43* 0.41*  MG 1.5* 1.4*  --  1.3*  PHOS 4.1  --   --   --    Lab Results  Component Value Date   K 3.2 (L) 06/11/2016   Estimated Creatinine Clearance: 100 mL/min (by C-G formula based on SCr of 0.41 mg/dL (L)).   Assessment: 56 yo M w/ partial SBO/ DTs. Patient was on TPN that was discontinued 2/20. Hx ETOH. Patient with poor po intake.  2/25  K=3.2, Mag = 1.3.    Plan:  Ordered KCl 20meq PO q2h x 2 doses and Mag 4g IV once.   Recheck K at 18:00. Follow up on AM labs.  Stormy CardKatsoudas,Corinthia Helmers K, [facility] AmarilloRPH Clinical Pharmacist 06/11/2016 8:08 AM

## 2016-06-11 NOTE — Progress Notes (Addendum)
MEDICATION RELATED CONSULT NOTE   Pharmacy Consult for Electrolyte monitoring Indication: hypokalemia, hypomagnesia  Allergies  Allergen Reactions  . Milk-Related Compounds     Recent Labs  06/09/16 0515 06/09/16 1553 06/10/16 0456 06/11/16 0548  CREATININE 0.63  --  0.43* 0.41*  MG 1.5* 1.4*  --  1.3*  PHOS 4.1  --   --   --    Lab Results  Component Value Date   K 3.2 (L) 06/11/2016   Estimated Creatinine Clearance: 100 mL/min (by C-G formula based on SCr of 0.41 mg/dL (L)).   Assessment: 56 yo M w/ partial SBO/ DTs. Patient was on TPN that was discontinued 2/20. Hx ETOH. Patient with poor po intake.  Plan:  2/25 913  K+ 3.2. Nurse said patient is refusing PO medications and only takes medications sporadically. Will order KCL 7230mEq/250ml IV x 1 dose.   Follow up on AM labs.  Gardner CandleSheema M Tanara Turvey, PharmD, BCPS Clinical Pharmacist 06/11/2016 7:41 PM

## 2016-06-11 NOTE — Progress Notes (Signed)
[facility] [place] [facility]   PATIENT NAME: Willie Baker    MR#:  [id number]  DATE OF BIRTH:  05/16/1960  SUBJECTIVE:   Patient was very agitated earlier currently sleeping   REVIEW OF SYSTEMS:   Review of Systems  Unable to perform ROS: Acuity of condition   Tolerating Diet: Yes  DRUG ALLERGIES:   Allergies  Allergen Reactions  . Milk-Related Compounds     VITALS:  Blood pressure 113/70, pulse (!) 118, temperature 98 F (36.7 C), temperature source Oral, resp. rate 19, height 6\' 1"  (1.854 m), weight 147 lb 12.8 oz (67 kg), SpO2 98 %.  PHYSICAL EXAMINATION:   Physical Exam  GENERAL:  56 y.o.-year-old patient lying In bed sedated EYES: Pupils equal, round, reactive to light. No scleral icterus.   HEENT: Head atraumatic, normocephalic.  NECK:  Supple, no jugular venous distention. No thyroid enlargement, no tenderness.  LUNGS: Normal breath sounds bilaterally, no wheezing, rales, upper airway rhonchi. No use of accessory muscles of respiration.  CARDIOVASCULAR: S1, S2 normal. No murmurs, rubs, or gallops. tachycardia ABDOMEN: Soft, nontender, non-distended, + BS. No organomegaly or mass.  EXTREMITIES: No cyanosis, clubbing or edema b/l.    NEUROLOGIC: Unable to do neuro exam because  sedated PSYCHIATRIC:  patient is sedated  SKIN: No obvious rash, lesion, or ulcer.  LABORATORY PANEL:  CBC  Recent Labs Lab 06/07/16 0450  WBC 10.0  HGB 10.8*  HCT 30.8*  PLT 231    Chemistries   Recent Labs Lab 06/11/16 0548  NA 136  K 3.2*  CL 105  CO2 25  GLUCOSE 112*  BUN <5*  CREATININE 0.41*  CALCIUM 8.3*  MG 1.3*   Cardiac Enzymes No results for input(s): TROPONINI in the last 168 hours. RADIOLOGY:  No results found. ASSESSMENT AND PLAN:  56 yo male with no past medical history presented to the ED with abdominal pain and non-bloody emesis. Admitted for persistent tachycardia.  1. SBO/ileus; resolved.   2. Alcohol  abuse/withdrawal- he here since February 8.  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 4. Hypokalemia/Hypomagnesemia -continue replace  5. Nutrition - Megace t.  6. Fever of unknown origin - remains afebrile  - UA was (-), CXR (-).  Blood cultures (-) so far.  We will stop Zosyn today as he has been afebrile   Pt's prognosis is poor.   Case discussed with Care Management/Social Worker. Management plans discussed with the patient, family, Dr. Excell Seltzercooper and they are in agreement.  CODE STATUS: full  DVT Prophylaxis: Ted's & SCD's  TOTAL TIME TAKING CARE OF THIS PATIENT: 25 minutes.    Note: This dictation was prepared with Dragon dictation along with smaller phrase technology. Any transcriptional errors that result from this process are unintentional.  Willie Baker, Willie Baker M.D on 06/11/2016 at 12:27 PM  Between 7am to 6pm - Pager - [phone]  After 6pm go to [email or web address] - Social research officer, governmentpassword EPAS [facility]  [facility] [place] [facility]  Office  [phone]  CC: Primary care physician; No PCP Per Patient

## 2016-06-12 LAB — CBC WITH DIFFERENTIAL/PLATELET
Basophils Absolute: 0.1 10*3/uL (ref 0–0.1)
Basophils Relative: 1 %
Eosinophils Absolute: 0 10*3/uL (ref 0–0.7)
Eosinophils Relative: 1 %
HCT: 31.1 % — ABNORMAL LOW (ref 40.0–52.0)
Hemoglobin: 11 g/dL — ABNORMAL LOW (ref 13.0–18.0)
LYMPHS PCT: 33 %
Lymphs Abs: 2.3 10*3/uL (ref 1.0–3.6)
MCH: 33.7 pg (ref 26.0–34.0)
MCHC: 35.4 g/dL (ref 32.0–36.0)
MCV: 95.2 fL (ref 80.0–100.0)
MONO ABS: 0.8 10*3/uL (ref 0.2–1.0)
MONOS PCT: 11 %
Neutro Abs: 3.8 10*3/uL (ref 1.4–6.5)
Neutrophils Relative %: 54 %
Platelets: 398 10*3/uL (ref 150–440)
RBC: 3.27 MIL/uL — ABNORMAL LOW (ref 4.40–5.90)
RDW: 13.5 % (ref 11.5–14.5)
WBC: 7 10*3/uL (ref 3.8–10.6)

## 2016-06-12 LAB — BASIC METABOLIC PANEL
Anion gap: 6 (ref 5–15)
BUN: <5 mg/dL — ABNORMAL LOW (ref 6–20)
CALCIUM: 8.3 mg/dL — AB (ref 8.9–10.3)
CO2: 24 mmol/L (ref 22–32)
CREATININE: 0.44 mg/dL — AB (ref 0.61–1.24)
Chloride: 105 mmol/L (ref 101–111)
GFR calc Af Amer: >60 mL/min (ref >60)
GFR calc non Af Amer: >60 mL/min (ref >60)
GLUCOSE: 96 mg/dL (ref 65–99)
Potassium: 3.3 mmol/L — ABNORMAL LOW (ref 3.5–5.1)
Sodium: 135 mmol/L (ref 135–145)

## 2016-06-12 LAB — POTASSIUM: Potassium: 3.4 mmol/L — ABNORMAL LOW (ref 3.5–5.1)

## 2016-06-12 LAB — GLUCOSE, CAPILLARY
Glucose-Capillary: 74 mg/dL (ref 65–99)
Glucose-Capillary: 114 mg/dL — ABNORMAL HIGH (ref 65–99)
Glucose-Capillary: 76 mg/dL (ref 65–99)
Glucose-Capillary: 85 mg/dL (ref 65–99)

## 2016-06-12 LAB — MAGNESIUM
Magnesium: 1.5 mg/dL — ABNORMAL LOW (ref 1.7–2.4)
Magnesium: 1.8 mg/dL (ref 1.7–2.4)

## 2016-06-12 LAB — PHOSPHORUS: Phosphorus: 4.4 mg/dL (ref 2.5–4.6)

## 2016-06-12 MED ORDER — MAGNESIUM SULFATE 4 GM/100ML IV SOLN
4.0000 g | Freq: Once | INTRAVENOUS | Status: AC
  Administered 2016-06-12: 11:00:00 4 g via INTRAVENOUS
  Filled 2016-06-12: qty 100

## 2016-06-12 MED ORDER — SODIUM CHLORIDE 0.9 % IV SOLN
30.0000 meq | Freq: Once | INTRAVENOUS | Status: AC
  Administered 2016-06-12: 11:00:00 30 meq via INTRAVENOUS
  Filled 2016-06-12: qty 15

## 2016-06-12 MED ORDER — SPIRITUS FRUMENTI
1.0000 | Freq: Four times a day (QID) | ORAL | Status: DC
  Administered 2016-06-12 – 2016-06-14 (×6): 1 via ORAL
  Filled 2016-06-12 (×18): qty 1

## 2016-06-12 MED ORDER — POTASSIUM CHLORIDE 2 MEQ/ML IV SOLN
30.0000 meq | Freq: Once | INTRAVENOUS | Status: AC
  Administered 2016-06-12: 22:00:00 30 meq via INTRAVENOUS
  Filled 2016-06-12: qty 15

## 2016-06-12 NOTE — Progress Notes (Signed)
MEDICATION RELATED CONSULT NOTE   Pharmacy Consult for Electrolyte monitoring Indication: hypokalemia, hypomagnesia  Allergies  Allergen Reactions  . Milk-Related Compounds     Recent Labs  06/09/16 1553 06/10/16 0456 06/11/16 0548 06/12/16 0500  WBC  --   --   --  7.0  HGB  --   --   --  11.0*  HCT  --   --   --  31.1*  PLT  --   --   --  398  CREATININE  --  0.43* 0.41* 0.44*  MG 1.4*  --  1.3* 1.5*  PHOS  --   --   --  4.4   Lab Results  Component Value Date   K 3.3 (L) 06/12/2016   Estimated Creatinine Clearance: 100 mL/min (by C-G formula based on SCr of 0.44 mg/dL (L)).   Assessment: 56 yo M w/ partial SBO/ DTs. Patient was on TPN that was discontinued 2/20. Hx ETOH. Patient with poor po intake.  Plan:  2/26: K= 3.3; Magnesium: 1.5. Will give KCl 30 mEQ IV x 1 and will give Magnesium 4 g IV x 1. Will recheck Magnesium and K @1800   Demetrius CharityJames,Christopher Hink D, PharmD, BCPS Clinical Pharmacist 06/12/2016 9:03 AM

## 2016-06-12 NOTE — Progress Notes (Signed)
While rounding the unit the Nurse asked, the [facility] to visit with the Pt. CH met with the Pt and girlfriend who was his bedside. Pt appeared disoriented, combative, and at one time tried to get out of the bed, but restrained by a nurse. Through the help of the girlfriend, [place] asked if he could offer prayers for the Pt and his girlfriend, the Pt said yes. CH offered prayers for the Pt's healing, and then informed the Pt and his girlfriend that [facility] was available if needed. Pt's girlfriend stated that the Pt was an alcoholic before he developed his current health complications. [place] observers that the Pt would benefit from a Chaplain's follow up visit.     06/12/16 1400  Clinical Encounter Type  Visited With Patient;Family  Visit Type Initial;Spiritual support;Social support  Referral From Nurse  Consult/Referral To Chaplain  Spiritual Encounters  Spiritual Needs Prayer;Emotional;Other (Comment)

## 2016-06-12 NOTE — Progress Notes (Signed)
PT Cancellation Note  Patient Details Name: Aris GeorgiaJoe T Desilva III MRN: [id number] DOB: 01/08/1961   Cancelled Treatment:    Reason Eval/Treat Not Completed: Other (comment).  Pt continues to be agitated and confused (per nursing and notes).  Per discussion with nursing, will hold PT at this time (pt recently given Ativan and a beer and finally resting).  Will re-attempt PT treatment at a later date/time.  Hendricks LimesEmily Mersades Barbaro, PT 06/12/16, 3:08 PM [phone]

## 2016-06-12 NOTE — Progress Notes (Signed)
MEDICATION RELATED CONSULT NOTE   Pharmacy Consult for Electrolyte monitoring Indication: hypokalemia, hypomagnesia  Allergies  Allergen Reactions  . Milk-Related Compounds     Recent Labs  06/10/16 0456 06/11/16 0548 06/12/16 0500 06/12/16 2010  WBC  --   --  7.0  --   HGB  --   --  11.0*  --   HCT  --   --  31.1*  --   PLT  --   --  398  --   CREATININE 0.43* 0.41* 0.44*  --   MG  --  1.3* 1.5* 1.8  PHOS  --   --  4.4  --    Lab Results  Component Value Date   K 3.4 (L) 06/12/2016   Estimated Creatinine Clearance: 100 mL/min (by C-G formula based on SCr of 0.44 mg/dL (L)).   Assessment: 56 yo M w/ partial SBO/ DTs. Patient was on TPN that was discontinued 2/20. Hx ETOH. Patient with poor po intake.  Plan:  2/26: K= 3.3; Magnesium: 1.5. Will give KCl 30 mEQ IV x 1 and will give Magnesium 4 g IV x 1. Will recheck Magnesium and K @1800   2/26 @ 20:10 :   K = 3.4,  Mag = 1.8 Will order KCl 30 mEq IV X 1 and recheck K on 2/27 with AM labs.   Scherrie Gerlachobbins,Demarlo Riojas D, PharmD Clinical Pharmacist 06/12/2016 8:51 PM

## 2016-06-12 NOTE — Progress Notes (Signed)
[facility] [place] [facility]   PATIENT NAME: Willie Baker    MR#:  [id number]  DATE OF BIRTH:  11/14/1960  SUBJECTIVE:   Pt continues to agiated and confused  REVIEW OF SYSTEMS:   Review of Systems  Unable to perform ROS: Acuity of condition   Tolerating Diet: Yes  DRUG ALLERGIES:   Allergies  Allergen Reactions  . Milk-Related Compounds     VITALS:  Blood pressure 103/72, pulse (!) 116, temperature 99 F (37.2 C), temperature source Axillary, resp. rate 18, height 6\' 1"  (1.854 m), weight 147 lb 12.8 oz (67 kg), SpO2 99 %.  PHYSICAL EXAMINATION:   Physical Exam  GENERAL:  56 y.o.-year-old patient lying In bed sedated EYES: Pupils equal, round, reactive to light. No scleral icterus.   HEENT: Head atraumatic, normocephalic.  NECK:  Supple, no jugular venous distention. No thyroid enlargement, no tenderness.  LUNGS: Normal breath sounds bilaterally, no wheezing, rales, upper airway rhonchi. No use of accessory muscles of respiration.  CARDIOVASCULAR: S1, S2 normal. No murmurs, rubs, or gallops. tachycardia ABDOMEN: Soft, nontender, non-distended, + BS. No organomegaly or mass.  EXTREMITIES: No cyanosis, clubbing or edema b/l.    NEUROLOGIC: Unable to do neuro exam because  confusion PSYCHIATRIC:  patient is confused SKIN: No obvious rash, lesion, or ulcer.  LABORATORY PANEL:  CBC  Recent Labs Lab 06/12/16 0500  WBC 7.0  HGB 11.0*  HCT 31.1*  PLT 398    Chemistries   Recent Labs Lab 06/12/16 0500  NA 135  K 3.3*  CL 105  CO2 24  GLUCOSE 96  BUN <5*  CREATININE 0.44*  CALCIUM 8.3*  MG 1.5*   Cardiac Enzymes No results for input(s): TROPONINI in the last 168 hours. RADIOLOGY:  No results found. ASSESSMENT AND PLAN:  56 yo male with no past medical history presented to the ED with abdominal pain and non-bloody emesis. Admitted for persistent tachycardia.  1. SBO/ileus; resolved.   2. Alcohol abuse/withdrawal- he  here since February 8.   no significant improvement d/w significant other will start patient on beer see if that would improve his current mental status is been treated with medications for prolonged period of time without any significant improvement  3.hypovolemichyponatremia: Sodium normal . 4. Hypokalemia/Hypomagnesemia -continue replace  5. Nutrition - Megace t.  6. Fever of unknown origin - remains afebrile  - UA was (-), CXR (-).  Blood cultures (-) so far.  We will stop Zosyn today as he has been afebrile   Pt's prognosis is poor.   Case discussed with Care Management/Social Worker. Management plans discussed with the patient, family, Dr. Excell Seltzercooper and they are in agreement.  CODE STATUS: full  DVT Prophylaxis: Ted's & SCD's  TOTAL TIME TAKING CARE OF THIS PATIENT: 25 minutes.    Note: This dictation was prepared with Dragon dictation along with smaller phrase technology. Any transcriptional errors that result from this process are unintentional.  Auburn BilberryPATEL, Willie Baker M.D on 06/12/2016 at 12:17 PM  Between 7am to 6pm - Pager - [phone]  After 6pm go to [email or web address] - Social research officer, governmentpassword EPAS [facility]  [facility] [place] [facility]  Office  [phone]  CC: Primary care physician; No PCP Per Patient

## 2016-06-13 LAB — GLUCOSE, CAPILLARY
Glucose-Capillary: 87 mg/dL (ref 65–99)
Glucose-Capillary: 93 mg/dL (ref 65–99)
Glucose-Capillary: 88 mg/dL (ref 65–99)
Glucose-Capillary: 99 mg/dL (ref 65–99)

## 2016-06-13 LAB — BASIC METABOLIC PANEL
Anion gap: 7 (ref 5–15)
CALCIUM: 8.9 mg/dL (ref 8.9–10.3)
CO2: 23 mmol/L (ref 22–32)
CREATININE: 0.51 mg/dL — AB (ref 0.61–1.24)
Chloride: 107 mmol/L (ref 101–111)
GFR calc Af Amer: >60 mL/min (ref >60)
GLUCOSE: 104 mg/dL — AB (ref 65–99)
Potassium: 3.8 mmol/L (ref 3.5–5.1)
Sodium: 137 mmol/L (ref 135–145)

## 2016-06-13 MED ORDER — DEXTROSE-NACL 5-0.9 % IV SOLN
INTRAVENOUS | Status: DC
  Administered 2016-06-13 – 2016-06-14 (×2): via INTRAVENOUS

## 2016-06-13 MED ORDER — QUETIAPINE FUMARATE 25 MG PO TABS
50.0000 mg | ORAL_TABLET | Freq: Three times a day (TID) | ORAL | Status: DC
  Administered 2016-06-13 – 2016-06-20 (×16): 50 mg via ORAL
  Filled 2016-06-13 (×19): qty 2

## 2016-06-13 MED ORDER — QUETIAPINE FUMARATE 25 MG PO TABS
25.0000 mg | ORAL_TABLET | Freq: Two times a day (BID) | ORAL | Status: DC
  Administered 2016-06-13: 10:00:00 25 mg via ORAL
  Filled 2016-06-13: qty 1

## 2016-06-13 MED ORDER — ENOXAPARIN SODIUM 40 MG/0.4ML ~~LOC~~ SOLN
40.0000 mg | SUBCUTANEOUS | Status: DC
  Administered 2016-06-14 – 2016-06-20 (×7): 40 mg via SUBCUTANEOUS
  Filled 2016-06-13 (×8): qty 0.4

## 2016-06-13 MED ORDER — HALOPERIDOL LACTATE 5 MG/ML IJ SOLN
1.0000 mg | INTRAMUSCULAR | Status: DC | PRN
  Administered 2016-06-14 – 2016-06-23 (×8): 1 mg via INTRAVENOUS
  Filled 2016-06-13 (×9): qty 1

## 2016-06-13 NOTE — Consult Note (Signed)
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 Alcohol abuse [F10.10] 06/08/2016  . Abdominal pain of unknown etiology [R10.9]   . Hyponatremia [E87.1]   . SBO (small bowel obstruction) [K56.609]   . Ileus ([place]) [K56.7]   . Tachycardia [R00.0] 05/25/2016    Total Time spent with patient: 15 minutes  Subjective:   Willie Baker is a 56 y.o. male patient admitted with "this is a service station". Came by to follow-up with patient on Tuesday the 27th. Found patient halfway out of bed in a diaper. Unresponsive. Didn't seem to be aware of my presence at all. There he confused. Reaching out for the IV pole. Patient apparently is still extremely confused throughout the day. I note that as of yesterday he has been started on oral alcohol as a treatment.  HPI:  Patient interviewed. Chart reviewed. Spoke with nurses. 56 year old man who has been in the hospital now for a couple of weeks. Treatment in the hospital for small bowel obstruction and also for presumed alcohol withdrawal. He remains confused and at times agitated. Patient himself was pleasant and cooperative with the interview with me but gave no real useful information. He thought that he was in a service station. Did not know the year and did not know his own age. Had no idea why he might be in a hospital. He rambled some stories about things he had done in the past and about his job. Said that he had continued to drink recently but that he thought he had cut way back on it. Was able to tell me that he lived in [place] with  his girlfriend. Nurses report that for the most part he is relatively quiet right now but that when they tried to do any kind of treatment with him or touch his body he rapidly gets agitated and at times strikes out and will curse. Observation the patient is not tremulous at all. He is confused but was able to stay attentive to me and hold a conversation with a little bit of internal coherency. Tachycardic but blood pressure stable.  Social history: Apparently he does live with his girlfriend and I'm told the girlfriend is the main person who visits him. 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  Medical history: Main medical problem here apparently was a small bowel obstruction and ileus.  Substance abuse history: Appears to have a long-standing history of alcohol abuse. Patient was not able to tell me about any treatment he had had in the past. Denied using any other drugs.    Past Psychiatric History: As far as I can tell there is no past significant psychiatric history other than what might be related to alcohol withdrawal. No known history of psychotic disorder or suicide or violence  Risk to Self: Is patient at risk for suicide?: No Risk to Others:   Prior Inpatient Therapy:   Prior Outpatient Therapy:    Past Medical History: History reviewed. No pertinent past medical history.  Past Surgical History:  Procedure Laterality Date  . FACIAL RECONSTRUCTION SURGERY     Family History:  Family  History  Problem Relation Age of Onset  . Breast cancer Mother    Family Psychiatric  History: Unknown Social History:  History  Alcohol Use  . Yes     History  Drug Use No    Social History   Social History  . Marital status: Single    Spouse name: N/A  . Number of children: N/A  . Years of education: N/A   Social History Main Topics  . Smoking status: Current Every Day Smoker  . Smokeless tobacco: Never Used  . Alcohol use Yes  . Drug use: No  . Sexual  activity: Not Asked   Other Topics Concern  . None   Social History Narrative  . None   Additional Social History:    Allergies:   Allergies  Allergen Reactions  . Milk-Related Compounds     Labs:  Results for orders placed or performed during the hospital encounter of 05/24/16 (from the past 48 hour(s))  Glucose, capillary     Status: None   Collection Time: 06/11/16  9:10 PM  Result Value Ref Range   Glucose-Capillary 93 65 - 99 mg/dL  CBC with Differential/Platelet     Status: Abnormal   Collection Time: 06/12/16  5:00 AM  Result Value Ref Range   WBC 7.0 3.8 - 10.6 K/uL   RBC 3.27 (L) 4.40 - 5.90 MIL/uL   Hemoglobin 11.0 (L) 13.0 - 18.0 g/dL   HCT 31.1 (L) 40.0 - 52.0 %   MCV 95.2 80.0 - 100.0 fL   MCH 33.7 26.0 - 34.0 pg   MCHC 35.4 32.0 - 36.0 g/dL   RDW 13.5 11.5 - 14.5 %   Platelets 398 150 - 440 K/uL   Neutrophils Relative % 54 %   Neutro Abs 3.8 1.4 - 6.5 K/uL   Lymphocytes Relative 33 %   Lymphs Abs 2.3 1.0 - 3.6 K/uL   Monocytes Relative 11 %   Monocytes Absolute 0.8 0.2 - 1.0 K/uL   Eosinophils Relative 1 %   Eosinophils Absolute 0.0 0 - 0.7 K/uL   Basophils Relative 1 %   Basophils Absolute 0.1 0 - 0.1 K/uL  Basic metabolic panel     Status: Abnormal   Collection Time: 06/12/16  5:00 AM  Result Value Ref Range   Sodium 135 135 - 145 mmol/L   Potassium 3.3 (L) 3.5 - 5.1 mmol/L   Chloride 105 101 - 111 mmol/L   CO2 24 22 - 32 mmol/L   Glucose, Bld 96 65 - 99 mg/dL   BUN <5 (L) 6 - 20 mg/dL   Creatinine, Ser 0.44 (L) 0.61 - 1.24 mg/dL   Calcium 8.3 (L) 8.9 - 10.3 mg/dL   GFR calc non Af Amer >60 >60 mL/min   GFR calc Af Amer >60 >60 mL/min    Comment: (NOTE) The eGFR has been calculated using the CKD EPI equation. This calculation has not been validated in all clinical situations. eGFR's persistently <60 mL/min signify possible Chronic Kidney Disease.    Anion gap 6 5 - 15  Magnesium     Status: Abnormal   Collection Time: 06/12/16  5:00 AM   Result Value Ref Range   Magnesium 1.5 (L) 1.7 - 2.4 mg/dL  Phosphorus     Status: None   Collection Time: 06/12/16  5:00 AM  Result Value Ref Range   Phosphorus 4.4 2.5 - 4.6 mg/dL  Glucose, capillary     Status: None   Collection Time: 06/12/16  8:05 AM  Result Value Ref Range   Glucose-Capillary 74 65 - 99 mg/dL  Glucose, capillary     Status: Abnormal   Collection Time: 06/12/16 12:35 PM  Result Value Ref Range   Glucose-Capillary 114 (H) 65 - 99 mg/dL  Glucose, capillary     Status: None   Collection Time: 06/12/16  5:32 PM  Result Value Ref Range   Glucose-Capillary 76 65 - 99 mg/dL  Potassium     Status: Abnormal   Collection Time: 06/12/16  8:10 PM  Result Value Ref Range   Potassium 3.4 (L) 3.5 - 5.1 mmol/L  Magnesium     Status: None   Collection Time: 06/12/16  8:10 PM  Result Value Ref Range   Magnesium 1.8 1.7 - 2.4 mg/dL  Glucose, capillary     Status: None   Collection Time: 06/12/16  8:18 PM  Result Value Ref Range   Glucose-Capillary 85 65 - 99 mg/dL  Basic metabolic panel     Status: Abnormal   Collection Time: 06/13/16  4:50 AM  Result Value Ref Range   Sodium 137 135 - 145 mmol/L   Potassium 3.8 3.5 - 5.1 mmol/L   Chloride 107 101 - 111 mmol/L   CO2 23 22 - 32 mmol/L   Glucose, Bld 104 (H) 65 - 99 mg/dL   BUN <5 (L) 6 - 20 mg/dL   Creatinine, Ser 0.51 (L) 0.61 - 1.24 mg/dL   Calcium 8.9 8.9 - 10.3 mg/dL   GFR calc non Af Amer >60 >60 mL/min   GFR calc Af Amer >60 >60 mL/min    Comment: (NOTE) The eGFR has been calculated using the CKD EPI equation. This calculation has not been validated in all clinical situations. eGFR's persistently <60 mL/min signify possible Chronic Kidney Disease.    Anion gap 7 5 - 15  Glucose, capillary     Status: None   Collection Time: 06/13/16  7:51 AM  Result Value Ref Range   Glucose-Capillary 87 65 - 99 mg/dL  Glucose, capillary     Status: None   Collection Time: 06/13/16 11:16 AM  Result Value Ref Range    Glucose-Capillary 93 65 - 99 mg/dL  Glucose, capillary     Status: None   Collection Time: 06/13/16  4:34 PM  Result Value Ref Range   Glucose-Capillary 88 65 - 99 mg/dL    Current Facility-Administered Medications  Medication Dose Route Frequency Provider Last Rate Last Dose  . acetaminophen (TYLENOL) tablet 650 mg  650 mg Oral Q6H PRN Harrie Foreman, MD   650 mg at 05/25/16 1935   Or  . acetaminophen (TYLENOL) suppository 650 mg  650 mg Rectal Q6H PRN Harrie Foreman, MD   650 mg at 06/03/16 1349  . dextrose 5 %-0.9 % sodium chloride infusion   Intravenous Continuous Dustin Flock, MD 75 mL/hr at 06/13/16 1637    . docusate sodium (COLACE) capsule 100 mg  100 mg Oral BID Harrie Foreman, MD   100 mg at 06/13/16 1002  . enoxaparin (LOVENOX) injection 40 mg  40 mg Subcutaneous Q24H Dustin Flock, MD      . feeding supplement (ENSURE ENLIVE) (ENSURE ENLIVE) liquid 237 mL  237 mL Oral TID BM Epifanio Lesches, MD   237 mL at 06/13/16 1004  . folic acid 1 mg in sodium chloride 0.9 % 50 mL IVPB  1 mg Intravenous Daily Lenis Noon, RPH   1 mg at 06/13/16 1004  .  haloperidol lactate (HALDOL) injection 1 mg  1 mg Intravenous Q4H PRN Dustin Flock, MD      . insulin aspart (novoLOG) injection 0-9 Units  0-9 Units Subcutaneous TID AC & HS Alexis Hugelmeyer, DO      . LORazepam (ATIVAN) injection 2 mg  2 mg Intravenous Q2H PRN Dustin Flock, MD   2 mg at 06/13/16 1218  . MEDLINE mouth rinse  15 mL Mouth Rinse BID Henreitta Leber, MD   15 mL at 06/13/16 1000  . megestrol (MEGACE) 400 MG/10ML suspension 400 mg  400 mg Oral Daily Dustin Flock, MD   400 mg at 06/13/16 1005  . metoprolol (LOPRESSOR) injection 5 mg  5 mg Intravenous Q6H PRN Lance Coon, MD   4 mg at 06/13/16 0604  . nicotine (NICODERM CQ - dosed in mg/24 hr) patch 14 mg  14 mg Transdermal Daily Lance Coon, MD   14 mg at 06/13/16 0501  . ondansetron (ZOFRAN) tablet 4 mg  4 mg Oral Q6H PRN Harrie Foreman, MD       Or   . ondansetron [facility]) injection 4 mg  4 mg Intravenous Q6H PRN Harrie Foreman, MD      . pantoprazole (PROTONIX) EC tablet 40 mg  40 mg Oral QAC breakfast Max Sane, MD   40 mg at 06/13/16 1002  . QUEtiapine (SEROQUEL) tablet 50 mg  50 mg Oral TID Gonzella Lex, MD      . sodium chloride flush (NS) 0.9 % injection 3 mL  3 mL Intravenous Q12H Harrie Foreman, MD   3 mL at 06/13/16 1005  . spiritus frumenti (ethyl alcohol) solution 1 each  1 each Oral QID Dustin Flock, MD   1 each at 06/13/16 1000  . thiamine (VITAMIN B-1) tablet 100 mg  100 mg Oral Daily Harrie Foreman, MD   100 mg at 06/12/16 0017   Or  . thiamine (B-1) injection 100 mg  100 mg Intravenous Daily Harrie Foreman, MD   100 mg at 06/13/16 1007  . tiotropium (SPIRIVA) inhalation capsule 18 mcg  18 mcg Inhalation Daily Harrie Foreman, MD   18 mcg at 06/13/16 0800  . traZODone (DESYREL) tablet 150 mg  150 mg Oral QHS Dustin Flock, MD   150 mg at 06/12/16 2117    Musculoskeletal: Strength & Muscle Tone: decreased Gait & Station: unable to stand Patient leans: Backward  Psychiatric Specialty Exam: Physical Exam  Nursing note and vitals reviewed. Constitutional: He appears cachectic. He is uncooperative. He appears toxic. He has a sickly appearance.  HENT:  Head: Normocephalic and atraumatic.  Eyes: Conjunctivae are normal. Pupils are equal, round, and reactive to light.  Neck: Normal range of motion.  Cardiovascular: Regular rhythm and normal heart sounds.   Respiratory: Effort normal. No respiratory distress.  GI: Soft.  Musculoskeletal: Normal range of motion.  Neurological: He is unresponsive.  Skin: Skin is warm and dry.  Psychiatric: His affect is blunt. His speech is not tangential. He is slowed. He is not aggressive, not hyperactive and not combative. Thought content is not paranoid and not delusional. Cognition and memory are impaired. He expresses impulsivity. He expresses no homicidal and no  suicidal ideation. He is communicative. He exhibits abnormal recent memory.  Agent is delirious unresponsive can really evaluate mental state    Review of Systems  Unable to perform ROS: Mental acuity  Constitutional: Negative.   HENT: Negative.   Eyes: Negative.   Respiratory:  Negative.   Cardiovascular: Negative.   Gastrointestinal: Negative.   Musculoskeletal: Positive for myalgias.  Skin: Negative.   Neurological: Negative.   Psychiatric/Behavioral: Negative for depression, hallucinations, memory loss, substance abuse and suicidal ideas. The patient is not nervous/anxious and does not have insomnia.     Blood pressure 102/69, pulse (!) 106, temperature 98 F (36.7 C), temperature source Axillary, resp. rate 20, height 6' 1" (1.854 m), weight 67 kg (147 lb 12.8 oz), SpO2 97 %.Body mass index is 19.5 kg/m.  General Appearance: Disheveled  Eye Contact:  Negative  Speech:  Negative  Volume:  Decreased  Mood:  Negative  Affect:  Constricted  Thought Process:  Disorganized and Irrelevant  Orientation:  Negative  Thought Content:  Negative  Suicidal Thoughts:  No  Homicidal Thoughts:  No  Memory:  Immediate;   Fair Recent;   Poor Remote;   Poor  Judgement:  Impaired  Insight:  Lacking  Psychomotor Activity:  Decreased  Concentration:  Concentration: Poor  Recall:  Poor  Fund of Knowledge:  Fair  Language:  Fair  Akathisia:  No  Handed:  Right  AIMS (if indicated):     Assets:  Housing  ADL's:  Impaired  Cognition:  Impaired,  Severe  Sleep:        Treatment Plan Summary: Daily contact with patient to assess and evaluate symptoms and progress in treatment, Medication management and Plan I am not going to intervene with the plan for alcohol. Will be interested to see the outcome. I am going to increase his Seroquel to 50 mg 3 times a day while discontinuing Risperdal to simplify the antipsychotic regimen. Would suggest that benzodiazepines be limited or avoid it  entirely. We'll follow-up as needed.  Disposition: Patient does not meet criteria for psychiatric inpatient admission. Supportive therapy provided about ongoing stressors.  Alethia Berthold, MD 06/13/2016 7:50 PM

## 2016-06-13 NOTE — Progress Notes (Signed)
Nutrition Follow-up  DOCUMENTATION CODES:   Severe malnutrition in context of acute illness/injury  INTERVENTION:  Recommend placing NG tube for initiation of enteral nutrition. If there is concern patient will pull NG tube out can consider use of sitter or soft restraints. Can also consider cycling enteral nutrition to run only during time period when patient is less agitated or a sitter will be with him. **Patient is not a candidate for TPN at this time and it is not indicated.** Discussed with Dr. Posey Pronto.  Continue Ensure Enlive po TID between meals, each supplement provides 350 kcal and 20 grams of protein.  NUTRITION DIAGNOSIS:   Malnutrition (Moderate) related to chronic illness, poor appetite (abdominal pain, NV) as evidenced by energy intake < 75% for > or equal to 1 month, moderate depletions of muscle mass, moderate depletion of body fat.  Ongoing.  GOAL:   Patient will meet greater than or equal to 90% of their needs  Not met.  MONITOR:   PO intake, Supplement acceptance, Labs, I & O's, Weight trends  REASON FOR ASSESSMENT:   Malnutrition Screening Tool    ASSESSMENT:   56 year old male with no PMHx presented with abdominal pain and non-bloody emesis admitted for persistent tachycardia.   -TPN was discontinued 2/20 due to resolution of SBO/ileus. Patient now day 7 of inadequate intake.   Patient very agitated at time of assessment. He had beer at bedside but was not drinking when RD was in room. Discussed with RN who reports patient has not been drinking Ensure, but will try to offer again after patient finishes beer.   Meal Completion: 0% and patient is not drinking Ensure Enlive.   Medications reviewed and include: Colace, folic acid 1 mg daily, Novolog sliding scale TID with meals and at bedtime, Megace 400 mg daily, pantoprazole, spiritus frumenti (ethyl alcohol) 1 each QID, thiamine 100 mg daily, D5NS @ 75 ml/hr (1.8 L/day, 90 grams dextrose, 306 kcal daily).    Labs reviewed: CBG 76-114 past 24 hrs, BUN <5, Creatinine 0.51.   Weight trend: 67 kg on 2/25; patient has lost 2.1 kg (3% body weight) over 3 weeks, which is significant for time frame.   In addition to moderate chronic malnutrition, patient now meets criteria for severe acute malnutrition in setting of 3% weight loss over 3 weeks, energy intake </=50% of estimated energy requirement for >/= 5 days.   Diet Order:  DIET SOFT Room service appropriate? Yes; Fluid consistency: Thin  Skin:  Reviewed, no issues  Last BM:  06/10/2016  Height:   Ht Readings from Last 1 Encounters:  06/06/16 6' 1"  (1.854 m)    Weight:   Wt Readings from Last 1 Encounters:  06/11/16 147 lb 12.8 oz (67 kg)    Ideal Body Weight:  83.6 kg  BMI:  Body mass index is 19.5 kg/m.  Estimated Nutritional Needs:   Kcal:  1900-2065 (MSJ x 1.2-1.3)  Protein:  70-83 grams (1-1.2 grams/kg)  Fluid:  2 L/day (30 ml/kg)  EDUCATION NEEDS:   No education needs identified at this time  Willey Blade, MS, RD, LDN Pager: [phone] After Hours Pager: [phone]

## 2016-06-13 NOTE — Progress Notes (Signed)
Physical Therapy Treatment Patient Details Name: Willie Baker MRN: [id number] DOB: May 27, 1960 Today's Date: 06/13/2016    History of Present Illness 56 y/o male here with abdominal pain, bloody vomiting, admitted with tachycardia.  Pt on CIWA protocol.    PT Comments    Pt is lethargic (recently had ativan, haldol and a beer) and struggles to stay on task t/o the session.  He is very weak and difficult to keep on task and ultimately he was very distracted and lacked safety awareness.  He was not able to shift weight forward onto walker despite a lot of heavy assist to keep his hips forward and weight on the walker as well as heavy verbal cuing and encouragement.  Pt is not safe to return home at this time, high fall risk when we attempted to take just 2 small forward steps.   Follow Up Recommendations  SNF     Equipment Recommendations  Rolling walker with 5" wheels    Recommendations for Other Services       Precautions / Restrictions Precautions Precautions: Fall Restrictions Weight Bearing Restrictions: No    Mobility  Bed Mobility Overal bed mobility: Needs Assistance Bed Mobility: Supine to Sit;Sit to Supine     Supine to sit: Min assist Sit to supine: Mod assist   General bed mobility comments: Pt impuslive in getting out of/into bed.  He is not consistently able to follow instructions or stay on task  Transfers Overall transfer level: Needs assistance Equipment used: Rolling walker (2 wheeled) Transfers: Sit to/from Stand Sit to Stand: Mod assist;Min assist         General transfer comment: 4-5 bouts of standing - pt needing assist to shift weight forward on each attempt (from minimally raised bed).  He struggled to shift weight forward even with heavy cuing and generally was unable to keep weight forward unless he was leaning LE back onto bed.   Ambulation/Gait Ambulation/Gait assistance: Max assist Ambulation Distance (Feet): 2 Feet Assistive  device: Rolling walker (2 wheeled)       General Gait Details: Pt completely unsafe, knees buckling and generally unable to tolerate prolonged standing with losing balance backward and inability to maintain appropriate posture of AD use   Stairs            Wheelchair Mobility    Modified Rankin (Stroke Patients Only)       Balance Overall balance assessment: Needs assistance Sitting-balance support: Bilateral upper extremity supported Sitting balance-Leahy Scale: Fair     Standing balance support: Bilateral upper extremity supported Standing balance-Leahy Scale: Poor Standing balance comment: Pt unable to get weight forward enough to maintain standing without at least some minimal assist to shift forward                    Cognition Arousal/Alertness: Lethargic Behavior During Therapy: Impulsive;Restless Overall Cognitive Status: Difficult to assess                      Exercises General Exercises - Lower Extremity Long Arc Quad: AROM;Strengthening;10 reps Heel Slides: Strengthening;AROM;10 reps Hip ABduction/ADduction: Strengthening;10 reps Hip Flexion/Marching: AROM;10 reps;Standing;Seated (X10 b/l in both standing in walker and while sitting at EOB)    General Comments        Pertinent Vitals/Pain      Home Living                      Prior Function  PT Goals (current goals can now be found in the care plan section) Progress towards PT goals: Progressing toward goals    Frequency    Min 2X/week      PT Plan Current plan remains appropriate    Co-evaluation             End of Session Equipment Utilized During Treatment: Gait belt Activity Tolerance: Patient limited by fatigue;Treatment limited secondary to agitation Patient left: with bed alarm set;with call bell/phone within reach;with family/visitor present   PT Visit Diagnosis: Muscle weakness (generalized) (M62.81);Difficulty in walking, not  elsewhere classified (R26.2);Unsteadiness on feet (R26.81)     Time: [id number] PT Time Calculation (min) (ACUTE ONLY): 27 min  Charges:  $Therapeutic Exercise: 8-22 mins $Therapeutic Activity: 8-22 mins                    G Codes:       Malachi ProGalen R Kasandra Fehr, DPT 06/13/2016, 1:59 PM

## 2016-06-13 NOTE — Progress Notes (Signed)
[facility] [place] [facility]   Baker NAME: Willie Baker    MR#:  [id number]  DATE OF BIRTH:  04/12/1961  SUBJECTIVE:   Baker started on beer yesterday he is still confused and agitated  REVIEW OF SYSTEMS:   Review of Systems  Unable to perform ROS: Acuity of condition   Tolerating Diet: Yes  DRUG ALLERGIES:   Allergies  Allergen Reactions  . Milk-Related Compounds     VITALS:  Blood pressure 102/69, pulse (!) 106, temperature 98 F (36.7 C), temperature source Axillary, resp. rate 20, height 6\' 1"  (1.854 m), weight 147 lb 12.8 oz (67 kg), SpO2 97 %.  PHYSICAL EXAMINATION:   Physical Exam  GENERAL:  56 y.o.-year-old Baker lying In bed sedated EYES: Pupils equal, round, reactive to light. Willie scleral icterus.   HEENT: Head atraumatic, normocephalic.  NECK:  Supple, Willie jugular venous distention. Willie thyroid enlargement, Willie tenderness.  LUNGS: Normal breath sounds bilaterally, Willie wheezing, rales, upper airway rhonchi. Willie use of accessory muscles of respiration.  CARDIOVASCULAR: S1, S2 normal. Willie murmurs, rubs, or gallops. tachycardia ABDOMEN: Soft, nontender, non-distended, + BS. Willie organomegaly or mass.  EXTREMITIES: Willie cyanosis, clubbing or edema b/l.    NEUROLOGIC: Unable to do neuro exam because  confusion PSYCHIATRIC:  Baker is confused SKIN: Willie obvious rash, lesion, or ulcer.  LABORATORY PANEL:  CBC  Recent Labs Lab 06/12/16 0500  WBC 7.0  HGB 11.0*  HCT 31.1*  PLT 398    Chemistries   Recent Labs Lab 06/12/16 2010 06/13/16 0450  NA  --  137  K 3.4* 3.8  CL  --  107  CO2  --  23  GLUCOSE  --  104*  BUN  --  <5*  CREATININE  --  0.51*  CALCIUM  --  8.9  MG 1.8  --    Cardiac Enzymes Willie results for input(s): TROPONINI in the last 168 hours. RADIOLOGY:  Willie results found. ASSESSMENT AND PLAN:  56 yo male with Willie past medical history presented to the ED with abdominal pain and non-bloody emesis. Admitted for  persistent tachycardia.  1. SBO/ileus; resolved.   2. Alcohol abuse/withdrawal- he here since February 8.   continue beer Continue anti axiety medication  3.hypovolemichyponatremia: Sodium normal . 4. Hypokalemia/Hypomagnesemia -continue replace  5. Nutrition - commended by nutritionist to start Baker on tube feeds however this Baker is still very agitated and will not be good candidate to have any type of tube feeds. Baker started on therapy with beer if he does not improve within the next 24 hours may need to start him on TPN.   6. Fever of unknown origin - remains afebrile  - UA was (-), CXR (-).  Blood cultures (-) so far.  We will stop Zosyn today as he has been afebrile   Pt's prognosis is poor.   Case discussed with Care Management/Social Worker. Management plans discussed with the Baker, family, Dr. Excell Baker and they are in agreement.  CODE STATUS: full  DVT Prophylaxis: Ted's & SCD's  TOTAL TIME TAKING CARE OF THIS Baker: 25 minutes.    Note: This dictation was prepared with Dragon dictation along with smaller phrase technology. Any transcriptional errors that result from this process are unintentional.  Willie BilberryPATEL, Willie Baker M.D on 06/13/2016 at 1:07 PM  Between 7am to 6pm - Pager - [phone]  After 6pm go to [email or web address] - Social research officer, governmentpassword EPAS [email or web address]  [facility] [place] [facility]  Office  [phone]  CC:  Primary care physician; Willie Baker

## 2016-06-13 NOTE — Progress Notes (Signed)
MEDICATION RELATED CONSULT NOTE   Pharmacy Consult for Electrolyte monitoring Indication: hypokalemia, hypomagnesia  Allergies  Allergen Reactions  . Milk-Related Compounds     Recent Labs  06/11/16 0548 06/12/16 0500 06/12/16 2010 06/13/16 0450  WBC  --  7.0  --   --   HGB  --  11.0*  --   --   HCT  --  31.1*  --   --   PLT  --  398  --   --   CREATININE 0.41* 0.44*  --  0.51*  MG 1.3* 1.5* 1.8  --   PHOS  --  4.4  --   --    Lab Results  Component Value Date   K 3.8 06/13/2016   Estimated Creatinine Clearance: 100 mL/min (by C-G formula based on SCr of 0.51 mg/dL (L)).   Assessment: 56 yo M w/ partial SBO/ DTs. Patient was on TPN that was discontinued 2/20. Hx ETOH. Patient with poor po intake.  Plan:  2/26: K= 3.3; Magnesium: 1.5. Will give KCl 30 mEQ IV x 1 and will give Magnesium 4 g IV x 1. Will recheck Magnesium and K @1800   2/26 @ 20:10 :   K = 3.4,  Mag = 1.8 Will order KCl 30 mEq IV X 1 and recheck K on 2/27 with AM labs.   2/27 0450 K 3.8, Ca 8.9, Mg not assessed, phos not assessed. No further supplement warranted at this time. Will recheck electrolytes with AM labs.  Carola FrostNathan A Ilsa Bonello, Pharm.D., BCPS Clinical Pharmacist 06/13/2016 6:03 AM

## 2016-06-14 DIAGNOSIS — E43 Unspecified severe protein-calorie malnutrition: Secondary | ICD-10-CM | POA: Insufficient documentation

## 2016-06-14 LAB — BASIC METABOLIC PANEL
Anion gap: 7 (ref 5–15)
CHLORIDE: 109 mmol/L (ref 101–111)
CO2: 21 mmol/L — AB (ref 22–32)
CREATININE: 0.47 mg/dL — AB (ref 0.61–1.24)
Calcium: 8 mg/dL — ABNORMAL LOW (ref 8.9–10.3)
GFR calc non Af Amer: >60 mL/min (ref >60)
Glucose, Bld: 456 mg/dL — ABNORMAL HIGH (ref 65–99)
POTASSIUM: 2.8 mmol/L — AB (ref 3.5–5.1)
SODIUM: 137 mmol/L (ref 135–145)

## 2016-06-14 LAB — MAGNESIUM
MAGNESIUM: 1.2 mg/dL — AB (ref 1.7–2.4)
MAGNESIUM: 1.8 mg/dL (ref 1.7–2.4)

## 2016-06-14 LAB — GLUCOSE, CAPILLARY
Glucose-Capillary: 97 mg/dL (ref 65–99)
Glucose-Capillary: 94 mg/dL (ref 65–99)
Glucose-Capillary: 97 mg/dL (ref 65–99)
Glucose-Capillary: 100 mg/dL — ABNORMAL HIGH (ref 65–99)

## 2016-06-14 LAB — RAPID HIV SCREEN (HIV 1/2 AB+AG)
HIV 1/2 Antibodies: NONREACTIVE
HIV-1 P24 Antigen - HIV24: NONREACTIVE

## 2016-06-14 LAB — VITAMIN B12: Vitamin B-12: 905 pg/mL (ref 180–914)

## 2016-06-14 LAB — POTASSIUM: Potassium: 3.3 mmol/L — ABNORMAL LOW (ref 3.5–5.1)

## 2016-06-14 LAB — PHOSPHORUS: PHOSPHORUS: 4.1 mg/dL (ref 2.5–4.6)

## 2016-06-14 MED ORDER — M.V.I. ADULT IV INJ
INJECTION | INTRAVENOUS | Status: DC
  Filled 2016-06-14: qty 720

## 2016-06-14 MED ORDER — INSULIN ASPART 100 UNIT/ML ~~LOC~~ SOLN
0.0000 [IU] | SUBCUTANEOUS | Status: DC
  Administered 2016-06-15: 18:00:00 3 [IU] via SUBCUTANEOUS
  Filled 2016-06-14: qty 3

## 2016-06-14 MED ORDER — TRACE MINERALS CR-CU-MN-SE-ZN 10-1000-500-60 MCG/ML IV SOLN
INTRAVENOUS | Status: AC
  Administered 2016-06-14: 18:00:00 via INTRAVENOUS
  Filled 2016-06-14: qty 720

## 2016-06-14 MED ORDER — VITAMIN B-1 100 MG PO TABS
100.0000 mg | ORAL_TABLET | Freq: Three times a day (TID) | ORAL | Status: DC
  Administered 2016-06-17 – 2016-06-18 (×5): 100 mg via ORAL
  Filled 2016-06-14 (×5): qty 1

## 2016-06-14 MED ORDER — LACTULOSE 10 GM/15ML PO SOLN
30.0000 g | Freq: Every day | ORAL | Status: DC
  Administered 2016-06-15: 12:00:00 30 g via ORAL
  Filled 2016-06-14: qty 60

## 2016-06-14 MED ORDER — THIAMINE HCL 100 MG/ML IJ SOLN
100.0000 mg | Freq: Three times a day (TID) | INTRAMUSCULAR | Status: AC
  Administered 2016-06-14 – 2016-06-17 (×9): 100 mg via INTRAVENOUS
  Filled 2016-06-14 (×9): qty 2

## 2016-06-14 MED ORDER — POTASSIUM CHLORIDE 20 MEQ/15ML (10%) PO SOLN
40.0000 meq | Freq: Four times a day (QID) | ORAL | Status: AC
  Administered 2016-06-14: 10:00:00 40 meq via ORAL
  Filled 2016-06-14: qty 30

## 2016-06-14 MED ORDER — MAGNESIUM SULFATE 4 GM/100ML IV SOLN
4.0000 g | Freq: Once | INTRAVENOUS | Status: AC
  Administered 2016-06-14: 4 g via INTRAVENOUS
  Filled 2016-06-14: qty 100

## 2016-06-14 MED ORDER — SODIUM CHLORIDE 0.9 % IV SOLN
30.0000 meq | Freq: Once | INTRAVENOUS | Status: AC
  Administered 2016-06-14: 30 meq via INTRAVENOUS
  Filled 2016-06-14: qty 15

## 2016-06-14 MED ORDER — POTASSIUM CHLORIDE CRYS ER 20 MEQ PO TBCR: 40.0000 meq | EXTENDED_RELEASE_TABLET | Freq: Two times a day (BID) | ORAL | Status: DC

## 2016-06-14 NOTE — Progress Notes (Signed)
MEDICATION RELATED CONSULT NOTE   Pharmacy Consult for Electrolyte monitoring Indication: hypokalemia, hypomagnesia  Allergies  Allergen Reactions  . Milk-Related Compounds     Recent Labs  06/12/16 0500 06/12/16 2010 06/13/16 0450 06/14/16 0500 06/14/16 1751  WBC 7.0  --   --   --   --   HGB 11.0*  --   --   --   --   HCT 31.1*  --   --   --   --   PLT 398  --   --   --   --   CREATININE 0.44*  --  0.51* 0.47*  --   MG 1.5* 1.8  --  1.2* 1.8  PHOS 4.4  --   --  4.1  --    Lab Results  Component Value Date   K 3.3 (L) 06/14/2016   Estimated Creatinine Clearance: 109.7 mL/min (by C-G formula based on SCr of 0.47 mg/dL (L)).   Assessment: 56 yo M w/ partial SBO/ DTs. Patient was on TPN that was discontinued 2/20. Hx ETOH. Patient with poor po intake.  Plan:  2/26: K= 3.3; Magnesium: 1.5. Will give KCl 30 mEQ IV x 1 and will give Magnesium 4 g IV x 1. Will recheck Magnesium and K @1800   2/26 @ 20:10 :   K = 3.4,  Mag = 1.8 Will order KCl 30 mEq IV X 1 and recheck K on 2/27 with AM labs.   2/27 0450 K 3.8, Ca 8.9, Mg not assessed, phos not assessed. No further supplement warranted at this time. Will recheck electrolytes with AM labs.  2/28 K 2.8, Na 137,  Mag 1.2, Phos 4.1.    Will order Magnesium sulfate 4 grams IV x 1.  Will order KCL PO 40meq q6h x 2 doses.  Will recheck K/Mag at 1800 and with am labs.  2/28  1751  K: 3.3  Mg: 1.8.   Patient refused PO dose of KCL. Will order KCL 30mEq IV x 1 dose.             WIll recheck electrolytes with am labs.    Juna Caban M Devine Klingel, Pharm.D., BCPS Clinical Pharmacist 06/14/2016 6:44 PM

## 2016-06-14 NOTE — Progress Notes (Signed)
MEDICATION RELATED CONSULT NOTE   Pharmacy Consult for Electrolyte monitoring Indication: hypokalemia, hypomagnesia  Allergies  Allergen Reactions  . Milk-Related Compounds     Recent Labs  06/12/16 0500 06/12/16 2010 06/13/16 0450 06/14/16 0500  WBC 7.0  --   --   --   HGB 11.0*  --   --   --   HCT 31.1*  --   --   --   PLT 398  --   --   --   CREATININE 0.44*  --  0.51* 0.47*  MG 1.5* 1.8  --  1.2*  PHOS 4.4  --   --  4.1   Lab Results  Component Value Date   K 2.8 (L) 06/14/2016   Estimated Creatinine Clearance: 115.9 mL/min (by C-G formula based on SCr of 0.47 mg/dL (L)).   Assessment: 56 yo M w/ partial SBO/ DTs. Patient was on TPN that was discontinued 2/20. Hx ETOH. Patient with poor po intake.  Plan:  2/26: K= 3.3; Magnesium: 1.5. Will give KCl 30 mEQ IV x 1 and will give Magnesium 4 g IV x 1. Will recheck Magnesium and K @1800   2/26 @ 20:10 :   K = 3.4,  Mag = 1.8 Will order KCl 30 mEq IV X 1 and recheck K on 2/27 with AM labs.   2/27 0450 K 3.8, Ca 8.9, Mg not assessed, phos not assessed. No further supplement warranted at this time. Will recheck electrolytes with AM labs.  2/28 K 2.8, Na 137,  Mag 1.2, Phos 4.1.    Will order Magnesium sulfate 4 grams IV x 1.  Will order KCL PO 40meq q6h x 2 doses.  Will recheck K/Mag at 1800 and with am labs.    Edwinna Rochette A, Pharm.D., BCPS Clinical Pharmacist 06/14/2016 7:33 AM

## 2016-06-14 NOTE — Plan of Care (Signed)
Problem: Pain Managment: Goal: General experience of comfort will improve Outcome: Progressing Patient has no c/o discomfort this shift   Problem: Physical Regulation: Goal: Will remain free from infection Outcome: Progressing Patient was afebrile this shift

## 2016-06-14 NOTE — Progress Notes (Signed)
Nutrition Follow-up  DOCUMENTATION CODES:   Severe malnutrition in context of acute illness/injury  INTERVENTION:  Discussed nutrition plan of care with Dr. Posey Pronto. Plan is to initiate TPN today for patient.  Recommend initiating Clinimix E 5/20 at 30 ml/hr. After 24 hours if electrolytes and CBGs are WNL can advance to goal of Clinimix E 5/20 @ 65 ml/hr. If baseline TG are within acceptable range (<400), can initiate 20% ILE at 20 ml/hr over 12 hours tomorrow. Goal regimen provides 1853 kcal, 78 grams of protein, 1800 ml fluid daily.   Monitor magnesium, potassium, and phosphorus daily for at least 3 days, MD to replete as needed, as pt is at risk for refeeding syndrome given severe malnutrition.  Recommend checking serum cobalamin (B12) level and supplementing if patient found to be deficient. Deficiency in B12 could also be related to patient's behavior in addition to thiamine deficiency (korsakoff's psychosis).   Continue Ensure Enlive po TID, each supplement provides 350 kcal and 20 grams of protein.   Still recommend consider soft restraints for patient so placement of small-bore NG tube can be attempted. TPN is not a long-term nutrition option for patient as he has no true indication for TPN and he is at increased risk for central line infection with infusion of TPN.  NUTRITION DIAGNOSIS:   Malnutrition (Moderate) related to chronic illness, poor appetite (abdominal pain, NV) as evidenced by energy intake < 75% for > or equal to 1 month, moderate depletions of muscle mass, moderate depletion of body fat.  Ongoing.  GOAL:   Patient will meet greater than or equal to 90% of their needs  Not met.   MONITOR:   PO intake, Supplement acceptance, Labs, I & O's, Weight trends  REASON FOR ASSESSMENT:   Malnutrition Screening Tool    ASSESSMENT:   56 year old male with no PMHx presented with abdominal pain and non-bloody emesis admitted for persistent tachycardia.   RD received  consult for new TPN. Discussed with RN and with patient's agitation do not feel comfortable placing small-bore feeding tube. Discussed option of soft restraints, which patient is not currently ordered for. Patient is only ordered for mitts.  Spoke with family member at bedside who would like RD to make changes to patient's diet order. She reports patient does not have a true milk allergy because he can eat cheese and ice cream. He just does not like to drink milk. Also she reports patient does not like rice. Will update diet in [facility].   Access: PICC Double Lumen placed 06/02/2016  Medications reviewed and include: Colace, folic acid 1 mg daily Iv, Novolog sliding scale TID before meals and at bedtime, Megace 400 mg daily Po, pantoprazole (patient refused), potassium chloride 40 mEq Q6hrs, spiritus frumenti (ethyl alcohol) QID, thiamine 100 mg IV TID (2/23-3/3), D5NS @ 75 ml/hr (1.8 L/day, 90 grams dextrose, 306 kcal daily), Ativan 2 mg Q2hrs PRN.  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 Could also be related to a vitamin B12 deficiency.  Labs reviewed: CBG 88-99 past 24 hrs, Potassium 2.8, CO2 21, Glucose 456, BUN <5, Creatinine 0.47.   Diet Order:  DIET SOFT Room service appropriate? Yes; Fluid consistency: Thin  Skin:  Reviewed, no issues  Last BM:  06/10/2016  Height:   Ht Readings from Last 1 Encounters:  06/06/16 6' 1"  (1.854 m)    Weight:   Wt Readings from Last 1 Encounters:  06/14/16 171  lb (77.6 kg)    Ideal Body Weight:  83.6 kg  BMI:  Body mass index is 22.56 kg/m.  Estimated Nutritional Needs:   Kcal:  1900-2065 (MSJ x 1.2-1.3)  Protein:  70-83 grams (1-1.2 grams/kg)  Fluid:  2 L/day (30 ml/kg)  EDUCATION NEEDS:   No education needs identified at this time  Willey Blade, MS, RD, LDN Pager: [phone] After Hours Pager: [phone]

## 2016-06-14 NOTE — Progress Notes (Signed)
[facility] [place] [facility]   PATIENT NAME: Willie Baker    MR#:  [id number]  DATE OF BIRTH:  11/11/1960  SUBJECTIVE:   Still agitated not taking much by mouth in.  REVIEW OF SYSTEMS:   Review of Systems  Unable to perform ROS: Acuity of condition   Tolerating Diet: Poor intake  DRUG ALLERGIES:   Allergies  Allergen Reactions  . Milk-Related Compounds     VITALS:  Blood pressure 136/69, pulse 99, temperature 97.9 F (36.6 C), temperature source Oral, resp. rate 20, height 6\' 1"  (1.854 m), weight 171 lb (77.6 kg), SpO2 100 %.  PHYSICAL EXAMINATION:   Physical Exam  GENERAL:  56 y.o.-year-old patient lying In bed Anxious EYES: Pupils equal, round, reactive to light. No scleral icterus.   HEENT: Head atraumatic, normocephalic.  NECK:  Supple, no jugular venous distention. No thyroid enlargement, no tenderness.  LUNGS: Normal breath sounds bilaterally, no wheezing, rales, upper airway rhonchi. No use of accessory muscles of respiration.  CARDIOVASCULAR: S1, S2 normal. No murmurs, rubs, or gallops. tachycardia ABDOMEN: Soft, nontender, non-distended, + BS. No organomegaly or mass.  EXTREMITIES: No cyanosis, clubbing or edema b/l.    NEUROLOGIC: Unable to do neuro exam because  confusion PSYCHIATRIC:  patient is confused SKIN: No obvious rash, lesion, or ulcer.  LABORATORY PANEL:  CBC  Recent Labs Lab 06/12/16 0500  WBC 7.0  HGB 11.0*  HCT 31.1*  PLT 398    Chemistries   Recent Labs Lab 06/14/16 0500  NA 137  K 2.8*  CL 109  CO2 21*  GLUCOSE 456*  BUN <5*  CREATININE 0.47*  CALCIUM 8.0*  MG 1.2*   Cardiac Enzymes No results for input(s): TROPONINI in the last 168 hours. RADIOLOGY:  No results found. ASSESSMENT AND PLAN:  56 yo male with no past medical history presented to the ED with abdominal pain and non-bloody emesis. Admitted for persistent tachycardia.  1. Alcohol abuse/withdrawal- he here since February 8.    continue beer Continue anti axiety medication I will give him higher dose of thiamine We'll check B12 level and supplement as needed Check HIV, check RPR   2. Poor by mouth intake: Discussed with the nurse taking care of the patient and other nurses that have taken care of the patient he is not a good candidate to have a NG tube placed patient will not be completely restrained. I will have to just restart him on TPN which is not ideal but unfortunately for this patient this is the best option  3.hypovolemichyponatremia: Sodium normal . 4. Hypokalemia/Hypomagnesemia -continue replace  5. Small bowel up structure and resolved  6. Fever of unknown origin - remains afebrile  - UA was (-), CXR (-).  Blood cultures (-) so far.  We will stop Zosyn today as he has been afebrile   Pt's prognosis is poor.   Case discussed with Care Management/Social Worker.CODE STATUS: full  DVT Prophylaxis: Ted's & SCD's  TOTAL TIME TAKING CARE OF THIS PATIENT: 25 minutes.    Note: This dictation was prepared with Dragon dictation along with smaller phrase technology. Any transcriptional errors that result from this process are unintentional.  Auburn BilberryPATEL, Kealii Thueson M.D on 06/14/2016 at 12:08 PM  Between 7am to 6pm - Pager - [phone]  After 6pm go to [email or web address] - Social research officer, governmentpassword EPAS [facility]  [facility] [place] [facility]  Office  [phone]  CC: Primary care physician; No PCP Per Patient

## 2016-06-14 NOTE — Consult Note (Signed)
Psychiatry: Follow-up for 56 year old man with dementia status post extended delirium. Patient seen. Spoke with his girlfriend as well. Patient was curled up in a uncomfortable-looking posture. He was unresponsive to voice and gentle movement area girlfriend says that there've been some episodes today of his being alert and at times he has spoken with her but mostly he has remained confused and unresponsive. Doesn't appear to be currently attempting to hurt himself.  Patient with advanced dementia. No indication at this point for any change to specific medication. I'm going to sign off of this. Please recontact if I can be of further assistance.

## 2016-06-14 NOTE — Clinical Social Work Note (Signed)
CSW spoke with pt's girlfriend to address discharge plan. Pt is not appropriate for SNF placement due to behaviors and a LOG will not be approved. Pt's options for discharge are limited. However, per pt's girlfriend, pt has a home and she will be reaching out to family to address care at home due to placement barriers. CSW updated RNCM and RN. CSW will continue to follow.   Dede QuerySarah Leylani Duley, MSW, LCSW  Clinical Social Worker  [phone]

## 2016-06-14 NOTE — Progress Notes (Signed)
PHARMACY - ADULT TOTAL PARENTERAL NUTRITION CONSULT NOTE   Pharmacy Consult for TPN monitoring (electrolyte and BG) Indication: ETOH related encephalopathy, malnutrition  Patient Measurements: Height: 6\' 1"  (185.4 cm) Weight: 171 lb (77.6 kg) IBW/kg (Calculated) : 79.9 TPN AdjBW (KG): 69.5 Body mass index is 22.56 kg/m.    Insulin requirements in the past 24 hours: 0  Lytes:  BMP Latest Ref Rng & Units 06/14/2016 06/13/2016 06/12/2016  Glucose 65 - 99 mg/dL 409(W456(H) 119(J104(H) -  BUN 6 - 20 mg/dL <4(N<5(L) <8(G<5(L) -  Creatinine 0.61 - 1.24 mg/dL 9.56(O0.47(L) 1.30(Q0.51(L) -  Sodium 135 - 145 mmol/L 137 137 -  Potassium 3.5 - 5.1 mmol/L 2.8(L) 3.8 3.4(L)  Chloride 101 - 111 mmol/L 109 107 -  CO2 22 - 32 mmol/L 21(L) 23 -  Calcium 8.9 - 10.3 mg/dL 8.0(L) 8.9 -   Magnesium  Date Value Ref Range Status  06/14/2016 1.2 (L) 1.7 - 2.4 mg/dL Final   Phosphorus  Date Value Ref Range Status  06/14/2016 4.1 2.5 - 4.6 mg/dL Final      Best Practices:  TPN Access:  TPN start date: 2/28    Plan:   Will order TPN Clinimix E 5/20 at  30 ml/hr as per dietary recommendation and plan to evaluate tomorrow to increase rate/add lipids as per dietary recommendations.  Diet: Has soft diet ordered but poor intake.  IV fluid regimen: D5NS at 175mL/hr  Electrolytes:   K 2.8, Na 137,  Mag 1.2, Phos 4.1.               Will order Magnesium sulfate 4 grams IV x 1.             Will order KCL PO 40meq q6h x 2 doses.             Will recheck K/Mag at 1800 and with am labs.   Continue SSI and blood glucose checks q6h.  SSI /24hrs= 0 unit.   Bari MantisKristin Evon Dejarnett PharmD Clinical Pharmacist 06/14/2016

## 2016-06-15 LAB — COMPREHENSIVE METABOLIC PANEL
ALBUMIN: 3.1 g/dL — AB (ref 3.5–5.0)
ALK PHOS: 61 U/L (ref 38–126)
ALT: 25 U/L (ref 17–63)
AST: 38 U/L (ref 15–41)
Anion gap: 9 (ref 5–15)
BUN: 6 mg/dL (ref 6–20)
CALCIUM: 9.2 mg/dL (ref 8.9–10.3)
CO2: 24 mmol/L (ref 22–32)
CREATININE: 0.49 mg/dL — AB (ref 0.61–1.24)
Chloride: 104 mmol/L (ref 101–111)
GFR calc Af Amer: >60 mL/min (ref >60)
GFR calc non Af Amer: >60 mL/min (ref >60)
GLUCOSE: 97 mg/dL (ref 65–99)
Potassium: 3.5 mmol/L (ref 3.5–5.1)
SODIUM: 137 mmol/L (ref 135–145)
Total Bilirubin: 1 mg/dL (ref 0.3–1.2)
Total Protein: 7.5 g/dL (ref 6.5–8.1)

## 2016-06-15 LAB — DIFFERENTIAL
BASOS PCT: 1 %
Basophils Absolute: 0.1 10*3/uL (ref 0–0.1)
Eosinophils Absolute: 0 10*3/uL (ref 0–0.7)
Eosinophils Relative: 0 %
LYMPHS ABS: 2.6 10*3/uL (ref 1.0–3.6)
Lymphocytes Relative: 33 %
MONO ABS: 0.8 10*3/uL (ref 0.2–1.0)
MONOS PCT: 10 %
NEUTROS ABS: 4.5 10*3/uL (ref 1.4–6.5)
Neutrophils Relative %: 56 %

## 2016-06-15 LAB — RPR: RPR Ser Ql: NONREACTIVE

## 2016-06-15 LAB — CBC
HEMATOCRIT: 36.7 % — AB (ref 40.0–52.0)
HEMOGLOBIN: 12.5 g/dL — AB (ref 13.0–18.0)
MCH: 32.9 pg (ref 26.0–34.0)
MCHC: 34.1 g/dL (ref 32.0–36.0)
MCV: 96.6 fL (ref 80.0–100.0)
Platelets: 313 10*3/uL (ref 150–440)
RBC: 3.8 MIL/uL — ABNORMAL LOW (ref 4.40–5.90)
RDW: 13.6 % (ref 11.5–14.5)
WBC: 8 10*3/uL (ref 3.8–10.6)

## 2016-06-15 LAB — PHOSPHORUS: Phosphorus: 3.9 mg/dL (ref 2.5–4.6)

## 2016-06-15 LAB — GLUCOSE, CAPILLARY
Glucose-Capillary: 104 mg/dL — ABNORMAL HIGH (ref 65–99)
Glucose-Capillary: 113 mg/dL — ABNORMAL HIGH (ref 65–99)
Glucose-Capillary: 229 mg/dL — ABNORMAL HIGH (ref 65–99)
Glucose-Capillary: 91 mg/dL (ref 65–99)
Glucose-Capillary: 95 mg/dL (ref 65–99)
Glucose-Capillary: 99 mg/dL (ref 65–99)

## 2016-06-15 LAB — CALCITRIOL (1,25 DI-OH VIT D): Vit D, 1,25-Dihydroxy: 10.7 pg/mL — ABNORMAL LOW (ref 19.9–79.3)

## 2016-06-15 LAB — MAGNESIUM: Magnesium: 1.6 mg/dL — ABNORMAL LOW (ref 1.7–2.4)

## 2016-06-15 LAB — TRIGLYCERIDES: Triglycerides: 72 mg/dL (ref <150)

## 2016-06-15 LAB — PREALBUMIN: Prealbumin: 6.2 mg/dL — ABNORMAL LOW (ref 18–38)

## 2016-06-15 MED ORDER — FAT EMULSION 20 % IV EMUL
250.0000 mL | INTRAVENOUS | Status: DC
  Filled 2016-06-15: qty 250

## 2016-06-15 MED ORDER — TRACE MINERALS CR-CU-MN-SE-ZN 10-1000-500-60 MCG/ML IV SOLN
INTRAVENOUS | Status: DC
  Filled 2016-06-15: qty 1560

## 2016-06-15 MED ORDER — MAGNESIUM SULFATE 4 GM/100ML IV SOLN
4.0000 g | Freq: Once | INTRAVENOUS | Status: AC
  Administered 2016-06-15: 08:00:00 4 g via INTRAVENOUS
  Filled 2016-06-15: qty 100

## 2016-06-15 MED ORDER — TRACE MINERALS CR-CU-MN-SE-ZN 10-1000-500-60 MCG/ML IV SOLN
INTRAVENOUS | Status: AC
  Administered 2016-06-15: 16:00:00 via INTRAVENOUS
  Filled 2016-06-15: qty 1560

## 2016-06-15 MED ORDER — FAT EMULSION 20 % IV EMUL
250.0000 mL | INTRAVENOUS | Status: AC
  Administered 2016-06-15: 250 mL via INTRAVENOUS
  Filled 2016-06-15: qty 250

## 2016-06-15 NOTE — Progress Notes (Signed)
[facility] [place] [facility]   PATIENT NAME: Willie Baker    MR#:  [id number]  DATE OF BIRTH:  03/25/1961  SUBJECTIVE:    patient condition unchanged still very agitated  REVIEW OF SYSTEMS:   Review of Systems  Unable to perform ROS: Acuity of condition   Tolerating Diet: Poor intake  DRUG ALLERGIES:   Allergies  Allergen Reactions  . Milk-Related Compounds     VITALS:  Blood pressure 99/83, pulse (!) 120, temperature 97.4 F (36.3 C), temperature source Oral, resp. rate 19, height 6\' 1"  (1.854 m), weight 162 lb (73.5 kg), SpO2 100 %.  PHYSICAL EXAMINATION:   Physical Exam  GENERAL:  56 y.o.-year-old patient lying In bed Anxious EYES: Pupils equal, round, reactive to light. No scleral icterus.   HEENT: Head atraumatic, normocephalic.  NECK:  Supple, no jugular venous distention. No thyroid enlargement, no tenderness.  LUNGS: Normal breath sounds bilaterally, no wheezing, rales, upper airway rhonchi. No use of accessory muscles of respiration.  CARDIOVASCULAR: S1, S2 normal. No murmurs, rubs, or gallops. tachycardia ABDOMEN: Soft, nontender, non-distended, + BS. No organomegaly or mass.  EXTREMITIES: No cyanosis, clubbing or edema b/l.    NEUROLOGICCurrently sedated after receiving Ativan PSYCHIATRIC:  patient sedated SKIN: No obvious rash, lesion, or ulcer.  LABORATORY PANEL:  CBC  Recent Labs Lab 06/15/16 0631  WBC 8.0  HGB 12.5*  HCT 36.7*  PLT 313    Chemistries   Recent Labs Lab 06/15/16 0631  NA 137  K 3.5  CL 104  CO2 24  GLUCOSE 97  BUN 6  CREATININE 0.49*  CALCIUM 9.2  MG 1.6*  AST 38  ALT 25  ALKPHOS 61  BILITOT 1.0   Cardiac Enzymes No results for input(s): TROPONINI in the last 168 hours. RADIOLOGY:  No results found. ASSESSMENT AND PLAN:  56 yo male with no past medical history presented to the ED with abdominal pain and non-bloody emesis. Admitted for persistent tachycardia.  1. Alcohol  abuse/withdrawal- he here since February 8.  No significant improved Continue anti axiety medication Pt given higher dose of thiamin b12 normal  hiv neg, rpr neg  2. Poor by mouth intake:  tpn  3.hypovolemichyponatremia: Sodium normal . 4. Hypokalemia/Hypomagnesemia -continue replace  5. Small bowel up structure and resolved  6. Fever of unknown origin - remains afebrile  - Pt's prognosis is poor.   Case discussed with Care Management/Social Worker.CODE STATUS: full  DVT Prophylaxis: Ted's & SCD's  TOTAL TIME TAKING CARE OF THIS PATIENT: 25 minutes.    Note: This dictation was prepared with Dragon dictation along with smaller phrase technology. Any transcriptional errors that result from this process are unintentional.  Auburn BilberryPATEL, Willie Baker M.D on 06/15/2016 at 11:47 AM  Between 7am to 6pm - Pager - [phone]  After 6pm go to [email or web address] - Social research officer, governmentpassword EPAS [facility]  [facility] [place] [facility]  Office  [phone]  CC: Primary care physician; No PCP Per Patient

## 2016-06-15 NOTE — Progress Notes (Signed)
PHARMACY - ADULT TOTAL PARENTERAL NUTRITION CONSULT NOTE   Pharmacy Consult for TPN monitoring (electrolyte and BG) Indication: ETOH related encephalopathy, malnutrition  Patient Measurements: Height: 6\' 1"  (185.4 cm) Weight: 162 lb (73.5 kg) IBW/kg (Calculated) : 79.9 TPN AdjBW (KG): 69.5 Body mass index is 21.37 kg/m.    Insulin requirements in the past 24 hours: 0  Lytes:  BMP Latest Ref Rng & Units 06/15/2016 06/14/2016 06/14/2016  Glucose 65 - 99 mg/dL 97 - 098(J456(H)  BUN 6 - 20 mg/dL 6 - <1(B<5(L)  Creatinine 1.470.61 - 1.24 mg/dL 8.29(F0.49(L) - 6.21(H0.47(L)  Sodium 135 - 145 mmol/L 137 - 137  Potassium 3.5 - 5.1 mmol/L 3.5 3.3(L) 2.8(L)  Chloride 101 - 111 mmol/L 104 - 109  CO2 22 - 32 mmol/L 24 - 21(L)  Calcium 8.9 - 10.3 mg/dL 9.2 - 8.0(L)   Magnesium  Date Value Ref Range Status  06/15/2016 1.6 (L) 1.7 - 2.4 mg/dL Final   Phosphorus  Date Value Ref Range Status  06/15/2016 3.9 2.5 - 4.6 mg/dL Final      Best Practices:  TPN Access: PICC TPN start date: 2/28    Plan:   Will order TPN Clinimix E 5/20 at  30 ml/hr as per dietary recommendation and plan to evaluate today to increase rate/add lipids as per dietary recommendations. Also has Folic Acid 1 mg IV daily  3/1  TG 72  Diet: Has soft diet ordered but poor intake.  SSI and blood glucose checks q6h.  SSI /24hrs= 0 unit.  IV fluid regimen: None   Electrolytes:   3/1:  K 3.5  Na 137, Mag 1.6, Phos 3.9, Ca 9.2   Will give Magnesium 4 gram IV x 1.    Bari MantisKristin Gracelynne Benedict PharmD Clinical Pharmacist 06/15/2016

## 2016-06-15 NOTE — Progress Notes (Signed)
PHARMACY - ADULT TOTAL PARENTERAL NUTRITION CONSULT NOTE   Pharmacy Consult for TPN monitoring (electrolyte and BG) Indication: ETOH related encephalopathy, malnutrition  Patient Measurements: Height: 6\' 1"  (185.4 cm) Weight: 162 lb (73.5 kg) IBW/kg (Calculated) : 79.9 TPN AdjBW (KG): 69.5 Body mass index is 21.37 kg/m.    Insulin requirements in the past 24 hours: 0  Lytes:  BMP Latest Ref Rng & Units 06/15/2016 06/14/2016 06/14/2016  Glucose 65 - 99 mg/dL 97 - 161(W456(H)  BUN 6 - 20 mg/dL 6 - <9(U<5(L)  Creatinine 0.450.61 - 1.24 mg/dL 4.09(W0.49(L) - 1.19(J0.47(L)  Sodium 135 - 145 mmol/L 137 - 137  Potassium 3.5 - 5.1 mmol/L 3.5 3.3(L) 2.8(L)  Chloride 101 - 111 mmol/L 104 - 109  CO2 22 - 32 mmol/L 24 - 21(L)  Calcium 8.9 - 10.3 mg/dL 9.2 - 8.0(L)   Magnesium  Date Value Ref Range Status  06/15/2016 1.6 (L) 1.7 - 2.4 mg/dL Final   Phosphorus  Date Value Ref Range Status  06/15/2016 3.9 2.5 - 4.6 mg/dL Final      Best Practices:  TPN Access: PICC TPN start date: 2/28    Plan:   Will increaseTPN Clinimix E 5/20 to 65 ml/hr as per dietary recommendation and add lipids 20% at 20 ml/hr x 12 hrs as per dietary recommendations. Also has Folic Acid 1 mg IV daily  3/1  TG 72  Diet: Has soft diet ordered but poor intake.  SSI and blood glucose checks q6h.  SSI /24hrs= 0 unit.  IV fluid regimen: None   Electrolytes:   3/1:  K 3.5  Na 137, Mag 1.6, Phos 3.9, Ca 9.2   Will give Magnesium 4 gram IV x 1.    Bari MantisKristin Armine Rizzolo PharmD Clinical Pharmacist 06/15/2016

## 2016-06-16 LAB — BASIC METABOLIC PANEL
ANION GAP: 8 (ref 5–15)
BUN: 14 mg/dL (ref 6–20)
CHLORIDE: 101 mmol/L (ref 101–111)
CO2: 24 mmol/L (ref 22–32)
Calcium: 9.3 mg/dL (ref 8.9–10.3)
Creatinine, Ser: 0.81 mg/dL (ref 0.61–1.24)
GFR calc Af Amer: >60 mL/min (ref >60)
GFR calc non Af Amer: >60 mL/min (ref >60)
GLUCOSE: 85 mg/dL (ref 65–99)
POTASSIUM: 3.8 mmol/L (ref 3.5–5.1)
Sodium: 133 mmol/L — ABNORMAL LOW (ref 135–145)

## 2016-06-16 LAB — GLUCOSE, CAPILLARY
Glucose-Capillary: 102 mg/dL — ABNORMAL HIGH (ref 65–99)
Glucose-Capillary: 103 mg/dL — ABNORMAL HIGH (ref 65–99)
Glucose-Capillary: 106 mg/dL — ABNORMAL HIGH (ref 65–99)
Glucose-Capillary: 107 mg/dL — ABNORMAL HIGH (ref 65–99)
Glucose-Capillary: 110 mg/dL — ABNORMAL HIGH (ref 65–99)
Glucose-Capillary: 98 mg/dL (ref 65–99)

## 2016-06-16 LAB — MAGNESIUM: Magnesium: 1.7 mg/dL (ref 1.7–2.4)

## 2016-06-16 MED ORDER — THIAMINE HCL 100 MG/ML IJ SOLN
200.0000 mg | Freq: Every day | INTRAMUSCULAR | Status: DC
  Administered 2016-06-17 – 2016-06-19 (×3): 200 mg via INTRAVENOUS
  Filled 2016-06-16 (×3): qty 2

## 2016-06-16 MED ORDER — MAGNESIUM SULFATE 2 GM/50ML IV SOLN
2.0000 g | Freq: Four times a day (QID) | INTRAVENOUS | Status: AC
  Administered 2016-06-16 (×2): 2 g via INTRAVENOUS
  Filled 2016-06-16 (×2): qty 50

## 2016-06-16 MED ORDER — POLYVINYL ALCOHOL 1.4 % OP SOLN: 1.0000 [drp] | OPHTHALMIC | Status: DC | PRN

## 2016-06-16 MED ORDER — TRACE MINERALS CR-CU-MN-SE-ZN 10-1000-500-60 MCG/ML IV SOLN
INTRAVENOUS | Status: AC
  Administered 2016-06-16: 17:00:00 via INTRAVENOUS
  Filled 2016-06-16: qty 1560

## 2016-06-16 MED ORDER — INSULIN ASPART 100 UNIT/ML ~~LOC~~ SOLN
0.0000 [IU] | Freq: Four times a day (QID) | SUBCUTANEOUS | Status: DC
  Administered 2016-06-18: 20:00:00 1 [IU] via SUBCUTANEOUS
  Filled 2016-06-16: qty 1

## 2016-06-16 MED ORDER — LACTULOSE 10 GM/15ML PO SOLN: 30.0000 g | Freq: Every day | ORAL | Status: DC | PRN

## 2016-06-16 MED ORDER — FAT EMULSION 20 % IV EMUL
250.0000 mL | INTRAVENOUS | Status: AC
  Administered 2016-06-16: 250 mL via INTRAVENOUS
  Filled 2016-06-16: qty 250

## 2016-06-16 MED ORDER — THIAMINE HCL 100 MG/ML IJ SOLN: 100.0000 mg | Freq: Every day | INTRAMUSCULAR | Status: DC

## 2016-06-16 NOTE — Progress Notes (Signed)
[facility] - [place] [facility]   PATIENT NAME: Willie Baker    MR#:  [id number]  DATE OF BIRTH:  06/13/1960  SUBJECTIVE:   Today very drowsy  REVIEW OF SYSTEMS:   Review of Systems  Unable to perform ROS: Acuity of condition   Tolerating Diet: Poor intake  DRUG ALLERGIES:   Allergies  Allergen Reactions  . Milk-Related Compounds     VITALS:  Blood pressure 97/65, pulse (!) 130, temperature 98.6 F (37 C), temperature source Oral, resp. rate (!) 22, height 6\' 1"  (1.854 m), weight 161 lb (73 kg), SpO2 99 %.  PHYSICAL EXAMINATION:   Physical Exam  GENERAL:  56 y.o.-year-old patient lying In bed Anxious EYES: Pupils equal, round, reactive to light. No scleral icterus.   HEENT: Head atraumatic, normocephalic.  NECK:  Supple, no jugular venous distention. No thyroid enlargement, no tenderness.  LUNGS: Normal breath sounds bilaterally, no wheezing, rales, upper airway rhonchi. No use of accessory muscles of respiration.  CARDIOVASCULAR: S1, S2 normal. No murmurs, rubs, or gallops. tachycardia ABDOMEN: Soft, nontender, non-distended, + BS. No organomegaly or mass.  EXTREMITIES: No cyanosis, clubbing or edema b/l.    NEUROLOGICCurrently sedated  PSYCHIATRIC:  patient sedated SKIN: No obvious rash, lesion, or ulcer.  LABORATORY PANEL:  CBC  Recent Labs Lab 06/15/16 0631  WBC 8.0  HGB 12.5*  HCT 36.7*  PLT 313    Chemistries   Recent Labs Lab 06/15/16 0631 06/16/16 0719  NA 137 133*  K 3.5 3.8  CL 104 101  CO2 24 24  GLUCOSE 97 85  BUN 6 14  CREATININE 0.49* 0.81  CALCIUM 9.2 9.3  MG 1.6* 1.7  AST 38  --   ALT 25  --   ALKPHOS 61  --   BILITOT 1.0  --    Cardiac Enzymes No results for input(s): TROPONINI in the last 168 hours. RADIOLOGY:  No results found. ASSESSMENT AND PLAN:  56 yo male with no past medical history presented to the ED with abdominal pain and non-bloody emesis. Admitted for persistent  tachycardia.  1. Alcohol abuse/withdrawal- he here since February 8.  No significant improved Continue anti axiety medication  2. Poor by mouth intake: Have discussed with the nurse taking care of the patient will try to put NG tube and start tube feeds   3.hypovolemichyponatremia: Sodium normal . 4. Hypokalemia/Hypomagnesemia -continue replace  5. Small bowel  obstruction resolved  6. Fever of unknown origin - remains afebrile  - Pt's prognosis is poor.   Case discussed with Care Management/Social Worker.CODE STATUS: full  DVT Prophylaxis: Ted's & SCD's  TOTAL TIME TAKING CARE OF THIS PATIENT: 25 minutes.    Note: This dictation was prepared with Dragon dictation along with smaller phrase technology. Any transcriptional errors that result from this process are unintentional.  Auburn BilberryPATEL, Jeydan Barner M.D on 06/16/2016 at 12:29 PM  Between 7am to 6pm - Pager - [phone]  After 6pm go to [email or web address] - Social research officer, governmentpassword EPAS [facility]  [facility]  Office  [phone]  CC: Primary care physician; No PCP Per Patient

## 2016-06-16 NOTE — Progress Notes (Signed)
PT Cancellation Note  Patient Details Name: Aris GeorgiaJoe T Spacek III MRN: [id number] DOB: 06/13/1960   Cancelled Treatment:    Reason Eval/Treat Not Completed: Medical issues which prohibited therapy Pt's vitals were unstable much of the day yesterday and this remains the case today.  Will hold PT until pt is more appropriate/stable.  Malachi ProGalen R Nahal Wanless, DPT 06/16/2016, 12:12 PM

## 2016-06-16 NOTE — Progress Notes (Signed)
PHARMACY - ADULT TOTAL PARENTERAL NUTRITION CONSULT NOTE   Pharmacy Consult for TPN monitoring (electrolyte and BG) Indication: ETOH related encephalopathy, malnutrition  Patient Measurements: Height: 6\' 1"  (185.4 cm) Weight: 161 lb (73 kg) IBW/kg (Calculated) : 79.9 TPN AdjBW (KG): 69.5 Body mass index is 21.24 kg/m.  Insulin requirements in the past 24 hours: 3 units   Lytes:  BMP Latest Ref Rng & Units 06/16/2016 06/15/2016 06/14/2016  Glucose 65 - 99 mg/dL 85 97 -  BUN 6 - 20 mg/dL 14 6 -  Creatinine 0.100.61 - 1.24 mg/dL 2.720.81 5.36(U0.49(L) -  Sodium 135 - 145 mmol/L 133(L) 137 -  Potassium 3.5 - 5.1 mmol/L 3.8 3.5 3.3(L)  Chloride 101 - 111 mmol/L 101 104 -  CO2 22 - 32 mmol/L 24 24 -  Calcium 8.9 - 10.3 mg/dL 9.3 9.2 -   Magnesium  Date Value Ref Range Status  06/16/2016 1.7 1.7 - 2.4 mg/dL Final   Phosphorus  Date Value Ref Range Status  06/15/2016 3.9 2.5 - 4.6 mg/dL Final      Best Practices:  TPN Access: PICC TPN start date: 2/28    Plan:   Will continue TPN Clinimix E 5/20 to 65 ml/hr as per dietary recommendation and add lipids 20% at 20 ml/hr x 12 hrs as per dietary recommendations. Also has Folic Acid 1 mg IV daily Thiamine IV TID thru 3/3 then daily   3/1  TG 72  Diet: Has soft diet ordered but poor/no intake d/t Altered mental status.  SSI and blood glucose checks q6h.  SSI /24hrs= 3 unit.  IV fluid regimen: None   Electrolytes:   3/2:  K 3.8  Na 133, Mag 1.7,  Ca 9.3  Alb 3.1   Will give Magnesium 2 gram IV q6h x 2 doses.    F/u labs in am.    Bari MantisKristin Iliani Vejar PharmD Clinical Pharmacist 06/16/2016

## 2016-06-16 NOTE — Progress Notes (Signed)
Pt refused drink ensure also spit medications out, refused to take PO meds

## 2016-06-16 NOTE — Progress Notes (Signed)
Nutrition Follow-up  DOCUMENTATION CODES:   Severe malnutrition in context of acute illness/injury  INTERVENTION:  When NG tube placed and confirmed recommend initiating Osmolite 1.5 @ 25 ml/hr. Advance by 10 ml/hr every 8 hours as tolerated to reach goal regimen of Osmolite 1.5 @ 55 ml/hr. Goal regimen provides 1980 kcal, 83 grams of protein, 1003 ml H2O daily.  Recommend free water flushes of 150 ml every 6 hours.  Unsure at this time if placement of NG tube will be successful. Continue TPN at goal regimen of Clinimix E 5/20 @ 65 ml/hr + 20% ILE @ 20 ml/hr over 12 hours. When patient tolerating goal TF regimen can taper off TPN.   Continue Ensure Enlive po TID, each supplement provides 350 kcal and 20 grams of protein.  NUTRITION DIAGNOSIS:   Malnutrition (Moderate) related to chronic illness, poor appetite (abdominal pain, NV) as evidenced by energy intake < 75% for > or equal to 1 month, moderate depletions of muscle mass, moderate depletion of body fat.  Ongoing - addressing with TPN.  GOAL:   Patient will meet greater than or equal to 90% of their needs  Met with TPN.  MONITOR:   PO intake, Supplement acceptance, Labs, I & O's, Weight trends  REASON FOR ASSESSMENT:   Malnutrition Screening Tool    ASSESSMENT:   56 year old male with no PMHx presented with abdominal pain and non-bloody emesis admitted for persistent tachycardia.   Discussed with RN. Patient continues to have poor PO and is not drinking Ensure Enlive. Plan is to attempt placement of NG tube today to initiate enteral nutrition. Order has been placed for soft restraints and plan is to attempt to place small-bore NG tube. Plan likely to be to allow patient to continue attempting oral intake - will leave Ensure order active.  TPN: Running at goal regimen of Clinimix E 5/20 @ 65 ml/hr + 20% ILE @ 20 ml/hr over 12 hours.  Meal Completion: 0% to bites. Also not drinking Ensure Enlive.  Medications reviewed  and include: Colace, folic acid 1 mg daily, Novolog sliding scale Q6hrs, lactulose 30 grams daily, magnesium sulfate 2 grams 3 times today, Megace 400 mg daily, pantoprazole, thiamine 100 mg IV TID through 3/3 then 100 mg once daily IV afterwards, Haldol 1 mg Q4hrs PRN, Ativan 2 mg Q2hrs PRN.  Labs reviewed: CBG 98-229 past 24 hrs, Magnesium and Potassium WNL.  Phosphorus WNL 3/1. Vitamin B12 WNL on 2/27.  I/O: 3 occurrences unmeasured urine output past 24 hrs, 2 bowel movements past 24 hrs  Diet Order:  DIET SOFT Room service appropriate? Yes; Fluid consistency: Thin .TPN (CLINIMIX-E) Adult  Skin:  Reviewed, no issues  Last BM:  06/16/2016  Height:   Ht Readings from Last 1 Encounters:  06/16/16 6' 1"  (1.854 m)    Weight:   Wt Readings from Last 1 Encounters:  06/16/16 161 lb (73 kg)    Ideal Body Weight:  83.6 kg  BMI:  Body mass index is 21.24 kg/m.  Estimated Nutritional Needs:   Kcal:  1900-2065 (MSJ x 1.2-1.3)  Protein:  70-83 grams (1-1.2 grams/kg)  Fluid:  2 L/day (30 ml/kg)  EDUCATION NEEDS:   No education needs identified at this time  Willey Blade, MS, RD, LDN Pager: [phone] After Hours Pager: [phone]

## 2016-06-16 NOTE — Progress Notes (Signed)
PHARMACY - ADULT TOTAL PARENTERAL NUTRITION CONSULT NOTE   Pharmacy Consult for TPN monitoring (electrolyte and BG) Indication: ETOH related encephalopathy, malnutrition  Patient Measurements: Height: 6\' 1"  (185.4 cm) Weight: 161 lb (73 kg) IBW/kg (Calculated) : 79.9 TPN AdjBW (KG): 73 Body mass index is 21.24 kg/m.  Insulin requirements in the past 24 hours: 3 units   Lytes:  BMP Latest Ref Rng & Units 06/16/2016 06/15/2016 06/14/2016  Glucose 65 - 99 mg/dL 85 97 -  BUN 6 - 20 mg/dL 14 6 -  Creatinine 6.570.61 - 1.24 mg/dL 8.460.81 9.62(X0.49(L) -  Sodium 135 - 145 mmol/L 133(L) 137 -  Potassium 3.5 - 5.1 mmol/L 3.8 3.5 3.3(L)  Chloride 101 - 111 mmol/L 101 104 -  CO2 22 - 32 mmol/L 24 24 -  Calcium 8.9 - 10.3 mg/dL 9.3 9.2 -   Magnesium  Date Value Ref Range Status  06/16/2016 1.7 1.7 - 2.4 mg/dL Final   Phosphorus  Date Value Ref Range Status  06/15/2016 3.9 2.5 - 4.6 mg/dL Final   3/1  TG 72   Best Practices:  TPN Access: PICC TPN start date: 2/28    Plan:   Will continue TPN Clinimix E 5/20 to 65 ml/hr as per dietary recommendation and add lipids 20% at 20 ml/hr x 12 hrs as per dietary recommendations. Also has Folic Acid 1 mg IV daily Thiamine IV TID thru 3/3 then TID po  Diet: Has soft diet ordered but poor/no intake d/t Altered mental status. SSI and blood glucose checks q6h.  SSI /24hrs= 3 unit. IV fluid regimen: None   Electrolytes:   3/2:  K 3.8  Na 133, Mag 1.7,  Ca 9.3  Alb 3.1   Will give Magnesium 2 gram IV q6h x 2 doses.    F/u labs in am.  Hypomagnesemia can exacerbate a Thiamine deficiency.   Goal for Magnesium of 2.0 or >.  NOTE: 3/2: Per MD will attempt to start tube feedings today.    Bari MantisKristin Caidan Hubbert PharmD Clinical Pharmacist 06/16/2016

## 2016-06-16 NOTE — Progress Notes (Signed)
I spoke to patient about placing a feeding tube down his nose to provide him with tube feeds.  Patient's girlfriend in room at time of discussion.  Patient agreed to placement.  I reconfirmed he was okay with placement of feeding tube prior to insertion.  Attempted to place tube and patient told, "No he did not want the tube".  I had approximately 5 inches inserted at time patient refused.  I removed tube, notified MD and dietician.

## 2016-06-16 NOTE — Progress Notes (Signed)
PT Cancellation Note  Patient Details Name: Aris GeorgiaJoe T Castles III MRN: [id number] DOB: 11/22/1960   Cancelled Treatment:    Reason Eval/Treat Not Completed: Medical issues which prohibited therapy Spoke with nurse this afternoon who reports they are trying to get a feeding tube in.  Not appropriate for PT at this time.   Malachi ProGalen R Anokhi Shannon 06/16/2016, 2:24 PM

## 2016-06-17 LAB — BASIC METABOLIC PANEL
Anion gap: 8 (ref 5–15)
BUN: 12 mg/dL (ref 6–20)
CALCIUM: 8.8 mg/dL — AB (ref 8.9–10.3)
CHLORIDE: 101 mmol/L (ref 101–111)
CO2: 24 mmol/L (ref 22–32)
CREATININE: 0.53 mg/dL — AB (ref 0.61–1.24)
GFR calc Af Amer: >60 mL/min (ref >60)
GFR calc non Af Amer: >60 mL/min (ref >60)
Glucose, Bld: 111 mg/dL — ABNORMAL HIGH (ref 65–99)
Potassium: 3.4 mmol/L — ABNORMAL LOW (ref 3.5–5.1)
SODIUM: 133 mmol/L — AB (ref 135–145)

## 2016-06-17 LAB — PHOSPHORUS: PHOSPHORUS: 3.8 mg/dL (ref 2.5–4.6)

## 2016-06-17 LAB — GLUCOSE, CAPILLARY
Glucose-Capillary: 97 mg/dL (ref 65–99)
Glucose-Capillary: 101 mg/dL — ABNORMAL HIGH (ref 65–99)
Glucose-Capillary: 100 mg/dL — ABNORMAL HIGH (ref 65–99)
Glucose-Capillary: 89 mg/dL (ref 65–99)
Glucose-Capillary: 110 mg/dL — ABNORMAL HIGH (ref 65–99)

## 2016-06-17 LAB — MAGNESIUM: Magnesium: 1.7 mg/dL (ref 1.7–2.4)

## 2016-06-17 MED ORDER — M.V.I. ADULT IV INJ
INJECTION | INTRAVENOUS | Status: AC
  Administered 2016-06-17: 18:00:00 via INTRAVENOUS
  Filled 2016-06-17: qty 1560

## 2016-06-17 MED ORDER — SODIUM CHLORIDE 0.9 % IV SOLN
30.0000 meq | Freq: Once | INTRAVENOUS | Status: AC
  Administered 2016-06-17: 30 meq via INTRAVENOUS
  Filled 2016-06-17: qty 15

## 2016-06-17 MED ORDER — MAGNESIUM SULFATE 2 GM/50ML IV SOLN
2.0000 g | Freq: Once | INTRAVENOUS | Status: AC
  Administered 2016-06-17: 2 g via INTRAVENOUS
  Filled 2016-06-17: qty 50

## 2016-06-17 MED ORDER — FAT EMULSION 20 % IV EMUL
250.0000 mL | INTRAVENOUS | Status: AC
  Administered 2016-06-17: 18:00:00 250 mL via INTRAVENOUS
  Filled 2016-06-17: qty 250

## 2016-06-17 NOTE — Progress Notes (Signed)
Lab attempted to draw morning labs, pt would not allow labs to be drawn at this time, lab tech states that she will try again at a later time

## 2016-06-17 NOTE — Plan of Care (Signed)
Problem: Nutrition: Goal: Adequate nutrition will be maintained Outcome: Progressing Patient took a couple bites of a cheeseburger, ate a few home fries, and drank half of an ensure today

## 2016-06-17 NOTE — Progress Notes (Signed)
Pt received haldol per order, pt with episode of increased agitation, verbally aggressive behavior, attempting to get out of bed and unable to redirect after several attempts, pt resting after haldol

## 2016-06-17 NOTE — Progress Notes (Signed)
PHARMACY - ADULT TOTAL PARENTERAL NUTRITION CONSULT NOTE   Pharmacy Consult for TPN monitoring (electrolyte and BG) Indication: ETOH related encephalopathy, malnutrition  Patient Measurements: Height: 6\' 1"  (185.4 cm) Weight: 161 lb (73 kg) IBW/kg (Calculated) : 79.9 TPN AdjBW (KG): 73 Body mass index is 21.24 kg/m.  Insulin requirements in the past 24 hours: none   Lytes:  BMP Latest Ref Rng & Units 06/17/2016 06/16/2016 06/15/2016  Glucose 65 - 99 mg/dL 244(W111(H) 85 97  BUN 6 - 20 mg/dL 12 14 6   Creatinine 0.61 - 1.24 mg/dL 1.02(V0.53(L) 2.530.81 6.64(Q0.49(L)  Sodium 135 - 145 mmol/L 133(L) 133(L) 137  Potassium 3.5 - 5.1 mmol/L 3.4(L) 3.8 3.5  Chloride 101 - 111 mmol/L 101 101 104  CO2 22 - 32 mmol/L 24 24 24   Calcium 8.9 - 10.3 mg/dL 0.3(K8.8(L) 9.3 9.2   Magnesium  Date Value Ref Range Status  06/17/2016 1.7 1.7 - 2.4 mg/dL Final   Phosphorus  Date Value Ref Range Status  06/17/2016 3.8 2.5 - 4.6 mg/dL Final   3/1  TG 72   Best Practices:  TPN Access: PICC TPN start date: 2/28    Plan:   Will continue TPN Clinimix E 5/20 to 65 ml/hr as per dietary recommendation and add lipids 20% at 20 ml/hr x 12 hrs as per dietary recommendations. Also has Folic Acid 1 mg IV daily Thiamine IV TID thru 3/3 then TID po  Diet: Has soft diet ordered but poor/no intake d/t Altered mental status. Tube feeds attempted, but patient refused NG tube SSI and blood glucose checks q6h.  SSI /24hrs= 0. IV fluid regimen: None   Electrolytes:   K: 3.4, Mg 1.7 Patient refusing oral medications. Will give KCl 30mEq IV x 1, Magnesium 2gm IV x 1 to keep Mg > 2.0. Recheck electrolytes with AM labs.  Garlon HatchetJody Ladarious Kresse, PharmD, BCPS Clinical Pharmacist  06/17/2016

## 2016-06-17 NOTE — Progress Notes (Signed)
[facility] - [place] [facility]   PATIENT NAME: Willie Baker    MR#:  [id number]  DATE OF BIRTH:  05/01/1960  SUBJECTIVE:   Patient little more awake today  REVIEW OF SYSTEMS:   Review of Systems  Unable to perform ROS: Acuity of condition   Tolerating Diet: Poor intake  DRUG ALLERGIES:   Allergies  Allergen Reactions  . Milk-Related Compounds     VITALS:  Blood pressure 118/62, pulse (!) 126, temperature 97.9 F (36.6 C), temperature source Oral, resp. rate 18, height 6\' 1"  (1.854 m), weight 161 lb (73 kg), SpO2 99 %.  PHYSICAL EXAMINATION:   Physical Exam  GENERAL:  56 y.o.-year-old patient lying In bed Anxious EYES: Pupils equal, round, reactive to light. No scleral icterus.   HEENT: Head atraumatic, normocephalic.  NECK:  Supple, no jugular venous distention. No thyroid enlargement, no tenderness.  LUNGS: Normal breath sounds bilaterally, no wheezing, rales, upper airway rhonchi. No use of accessory muscles of respiration.  CARDIOVASCULAR: S1, S2 normal. No murmurs, rubs, or gallops. tachycardia ABDOMEN: Soft, nontender, non-distended, + BS. No organomegaly or mass.  EXTREMITIES: No cyanosis, clubbing or edema b/l.    Neurologic Little more awake trying to communicate PSYCHIATRIC:  Little more awake and communicating SKIN: No obvious rash, lesion, or ulcer.  LABORATORY PANEL:  CBC  Recent Labs Lab 06/15/16 0631  WBC 8.0  HGB 12.5*  HCT 36.7*  PLT 313    Chemistries   Recent Labs Lab 06/15/16 0631 06/16/16 0719  NA 137 133*  K 3.5 3.8  CL 104 101  CO2 24 24  GLUCOSE 97 85  BUN 6 14  CREATININE 0.49* 0.81  CALCIUM 9.2 9.3  MG 1.6* 1.7  AST 38  --   ALT 25  --   ALKPHOS 61  --   BILITOT 1.0  --    Cardiac Enzymes No results for input(s): TROPONINI in the last 168 hours. RADIOLOGY:  No results found. ASSESSMENT AND PLAN:  56 yo male with no past medical history presented to the ED with abdominal pain and  non-bloody emesis. Admitted for persistent tachycardia.  1. Alcohol abuse/withdrawal- he here since February 8.  No significant improved Continue anti axiety medication We will try to move patient to another room with brighter lights to see if that may have effect on him  2. Poor by mouth intake: Continue TPN and oral intake unable to put NG tube yesterday  3.hypovolemichyponatremia: Sodium normal . 4. Hypokalemia/Hypomagnesemia -continue replace  5. Small bowel  obstruction resolved  6. Fever of unknown origin - remains afebrile    Case discussed with Care Management/Social Worker.CODE STATUS: full  DVT Prophylaxis: Ted's & SCD's  TOTAL TIME TAKING CARE OF THIS PATIENT: 22 minutes.    Note: This dictation was prepared with Dragon dictation along with smaller phrase technology. Any transcriptional errors that result from this process are unintentional.  Auburn BilberryPATEL, Brenly Trawick M.D on 06/17/2016 at 11:01 AM  Between 7am to 6pm - Pager - [phone]  After 6pm go to [email or web address] - Social research officer, governmentpassword EPAS [facility]  [facility] [place] [facility]  Office  [phone]  CC: Primary care physician; No PCP Per Patient

## 2016-06-17 NOTE — Progress Notes (Signed)
While rounding the unit the Nurse asked the [facility] to visit the Pt. Pt was aggressive and threatening the Nurse. CH met with the Pt and talked to the Pt. Pt told the [facility] that he was cold and unhappy about staying in the [place] appeared distressed but was talkative. CH observes that Pt room was too dark and that changing the Pt's Rm could help the Pt recover faster. Pt was later moved to a brighter Rm 110A. [place] asked the Pt to be nice to the Nurses, which the Pt agreed to do. [place] offered prayers for the Pt to get better and the ministry of presence. Note: Pt would benefit from a CH's follow up visit.     06/17/16 2000  Clinical Encounter Type  Visited With Patient  Visit Type Follow-up;Spiritual support;Social support  Referral From Nurse  Consult/Referral To Chaplain  Spiritual Encounters  Spiritual Needs Prayer;Other (Comment)

## 2016-06-18 LAB — BASIC METABOLIC PANEL
ANION GAP: 6 (ref 5–15)
BUN: 21 mg/dL — ABNORMAL HIGH (ref 6–20)
CALCIUM: 9.2 mg/dL (ref 8.9–10.3)
CO2: 23 mmol/L (ref 22–32)
CREATININE: 0.57 mg/dL — AB (ref 0.61–1.24)
Chloride: 104 mmol/L (ref 101–111)
GFR calc non Af Amer: >60 mL/min (ref >60)
Glucose, Bld: 109 mg/dL — ABNORMAL HIGH (ref 65–99)
Potassium: 3.4 mmol/L — ABNORMAL LOW (ref 3.5–5.1)
SODIUM: 133 mmol/L — AB (ref 135–145)

## 2016-06-18 LAB — GLUCOSE, CAPILLARY
Glucose-Capillary: 116 mg/dL — ABNORMAL HIGH (ref 65–99)
Glucose-Capillary: 91 mg/dL (ref 65–99)
Glucose-Capillary: 94 mg/dL (ref 65–99)
Glucose-Capillary: 138 mg/dL — ABNORMAL HIGH (ref 65–99)

## 2016-06-18 LAB — MAGNESIUM: MAGNESIUM: 2 mg/dL (ref 1.7–2.4)

## 2016-06-18 MED ORDER — POTASSIUM CHLORIDE CRYS ER 10 MEQ PO TBCR
40.0000 meq | EXTENDED_RELEASE_TABLET | Freq: Once | ORAL | Status: AC
Start: 2016-06-18 — End: 2016-06-18
  Administered 2016-06-18: 10:00:00 20 meq via ORAL
  Filled 2016-06-18: qty 4

## 2016-06-18 MED ORDER — SODIUM CHLORIDE 0.9 % IV SOLN
30.0000 meq | Freq: Once | INTRAVENOUS | Status: AC
  Administered 2016-06-18: 30 meq via INTRAVENOUS
  Filled 2016-06-18: qty 15

## 2016-06-18 MED ORDER — FAT EMULSION 20 % IV EMUL
250.0000 mL | INTRAVENOUS | Status: AC
  Administered 2016-06-18: 18:00:00 250 mL via INTRAVENOUS
  Filled 2016-06-18: qty 250

## 2016-06-18 MED ORDER — TRACE MINERALS CR-CU-MN-SE-ZN 10-1000-500-60 MCG/ML IV SOLN
INTRAVENOUS | Status: AC
  Administered 2016-06-18: 18:00:00 via INTRAVENOUS
  Filled 2016-06-18: qty 1560

## 2016-06-18 NOTE — Progress Notes (Signed)
PHARMACY - ADULT TOTAL PARENTERAL NUTRITION CONSULT NOTE   Pharmacy Consult for TPN monitoring (electrolyte and BG) Indication: ETOH related encephalopathy, malnutrition  Patient Measurements: Height: 6\' 1"  (185.4 cm) Weight: 170 lb (77.1 kg) IBW/kg (Calculated) : 79.9 TPN AdjBW (KG): 73 Body mass index is 22.43 kg/m.  Insulin requirements in the past 24 hours: none   Lytes:  BMP Latest Ref Rng & Units 06/18/2016 06/17/2016 06/16/2016  Glucose 65 - 99 mg/dL 010(U109(H) 725(D111(H) 85  BUN 6 - 20 mg/dL 66(Y21(H) 12 14  Creatinine 0.61 - 1.24 mg/dL 4.03(K0.57(L) 7.42(V0.53(L) 9.560.81  Sodium 135 - 145 mmol/L 133(L) 133(L) 133(L)  Potassium 3.5 - 5.1 mmol/L 3.4(L) 3.4(L) 3.8  Chloride 101 - 111 mmol/L 104 101 101  CO2 22 - 32 mmol/L 23 24 24   Calcium 8.9 - 10.3 mg/dL 9.2 3.8(V8.8(L) 9.3   Magnesium  Date Value Ref Range Status  06/18/2016 2.0 1.7 - 2.4 mg/dL Final   Phosphorus  Date Value Ref Range Status  06/17/2016 3.8 2.5 - 4.6 mg/dL Final   3/1  TG 72   Best Practices:  TPN Access: PICC TPN start date: 2/28    Plan:   Will continue TPN Clinimix E 5/20 to 65 ml/hr as per dietary recommendation and add lipids 20% at 20 ml/hr x 12 hrs as per dietary recommendations. Also has Folic Acid 1 mg IV daily Thiamine IV TID thru 3/3 then TID po  Diet: Has soft diet ordered but poor/no intake d/t Altered mental status. Tube feeds attempted, but patient refused NG tube SSI and blood glucose checks q6h.  SSI /24hrs= 0. IV fluid regimen: None   Electrolytes:   K: 3.4 Ordered KCl 40mEq PO, but patient would only take 20mEq. Will given and additional 30mEq IV x 1. Recheck electrolytes with AM labs  Garlon HatchetJody Stephaney Steven, PharmD, BCPS Clinical Pharmacist  06/18/2016

## 2016-06-18 NOTE — Plan of Care (Signed)
Problem: Health Behavior/Discharge Planning: Goal: Ability to manage health-related needs will improve Outcome: Not Progressing Pt remains unable to participate in his ADL's  Problem: Fluid Volume: Goal: Ability to maintain a balanced intake and output will improve Outcome: Not Progressing Pt continues to only eat bites of food with encouragement

## 2016-06-18 NOTE — Progress Notes (Signed)
[facility] [place] at [facility]   PATIENT NAME: Willie Baker    MR#:  [id number]  DATE OF BIRTH:  03/06/1961  SUBJECTIVE:   Pt did better yesterday during the day then became agiated as the sun went down, he still confused  REVIEW OF SYSTEMS:   Review of Systems  Unable to perform ROS: Acuity of condition  Cardiovascular: Negative for leg swelling.   Tolerating Diet: Poor intake on tpn  DRUG ALLERGIES:   Allergies  Allergen Reactions  . Milk-Related Compounds     VITALS:  Blood pressure 127/69, pulse (!) 124, temperature 97.8 F (36.6 C), temperature source Oral, resp. rate 20, height 6\' 1"  (1.854 m), weight 170 lb (77.1 kg), SpO2 100 %.  PHYSICAL EXAMINATION:   Physical Exam  GENERAL:  56 y.o.-year-old patient lying In bed Anxious EYES: Pupils equal, round, reactive to light. No scleral icterus.   HEENT: Head atraumatic, normocephalic.  NECK:  Supple, no jugular venous distention. No thyroid enlargement, no tenderness.  LUNGS: Normal breath sounds bilaterally, no wheezing, rales, upper airway rhonchi. No use of accessory muscles of respiration.  CARDIOVASCULAR: S1, S2 normal. No murmurs, rubs, or gallops. tachycardia ABDOMEN: Soft, nontender, non-distended, + BS. No organomegaly or mass.  EXTREMITIES: No cyanosis, clubbing or edema b/l.    Neurologic Little more awake trying to communicate PSYCHIATRIC:  Little more awake and Communicating making more sense SKIN: No obvious rash, lesion, or ulcer.  LABORATORY PANEL:  CBC  Recent Labs Lab 06/15/16 0631  WBC 8.0  HGB 12.5*  HCT 36.7*  PLT 313    Chemistries   Recent Labs Lab 06/15/16 0631  06/18/16 0514  NA 137  < > 133*  K 3.5  < > 3.4*  CL 104  < > 104  CO2 24  < > 23  GLUCOSE 97  < > 109*  BUN 6  < > 21*  CREATININE 0.49*  < > 0.57*  CALCIUM 9.2  < > 9.2  MG 1.6*  < > 2.0  AST 38  --   --   ALT 25  --   --   ALKPHOS 61  --   --   BILITOT 1.0  --   --   < > =  values in this interval not displayed. Cardiac Enzymes No results for input(s): TROPONINI in the last 168 hours. RADIOLOGY:  No results found. ASSESSMENT AND PLAN:  56 yo male with no past medical history presented to the ED with abdominal pain and non-bloody emesis. Admitted for persistent tachycardia.  1. Alcohol abuse/withdrawal- he here since February 8.  Some improvement compared to earlier this week Continue anti axiety medication Continue supportive care  2. Poor po intake pt on tpn now  3. hyponatremia: Sodium normal . 4. Hypokalemia/Hypomagnesemia -continue replace  5. Small bowel  obstruction resolved  6. Fever of unknown origin - remains afebrile    Case discussed with Care Management/Social Worker.CODE STATUS: full  DVT Prophylaxis: Ted's & SCD's  TOTAL TIME TAKING CARE OF THIS PATIENT: 22 minutes.    Note: This dictation was prepared with Dragon dictation along with smaller phrase technology. Any transcriptional errors that result from this process are unintentional.  Willie BilberryPATEL, Morna Flud M.D on 06/18/2016 at 11:13 AM  Between 7am to 6pm - Pager - [phone]  After 6pm go to [email or web address] - Social research officer, governmentpassword EPAS [facility]  [facility]  Office  [phone]  CC: Primary care physician; No PCP Per Patient

## 2016-06-19 ENCOUNTER — Inpatient Hospital Stay: Payer: Medicaid Other

## 2016-06-19 LAB — DIFFERENTIAL
BASOS ABS: 0 10*3/uL (ref 0–0.1)
BASOS PCT: 0 %
EOS ABS: 0.1 10*3/uL (ref 0–0.7)
Eosinophils Relative: 1 %
Lymphocytes Relative: 26 %
Lymphs Abs: 2.6 10*3/uL (ref 1.0–3.6)
Monocytes Absolute: 0.9 10*3/uL (ref 0.2–1.0)
Monocytes Relative: 9 %
NEUTROS ABS: 6.5 10*3/uL (ref 1.4–6.5)
NEUTROS PCT: 64 %

## 2016-06-19 LAB — MAGNESIUM: MAGNESIUM: 1.7 mg/dL (ref 1.7–2.4)

## 2016-06-19 LAB — TRIGLYCERIDES: Triglycerides: 48 mg/dL (ref <150)

## 2016-06-19 LAB — COMPREHENSIVE METABOLIC PANEL
ALK PHOS: 57 U/L (ref 38–126)
ALT: 24 U/L (ref 17–63)
AST: 42 U/L — ABNORMAL HIGH (ref 15–41)
Albumin: 2.8 g/dL — ABNORMAL LOW (ref 3.5–5.0)
Anion gap: 6 (ref 5–15)
BUN: 24 mg/dL — ABNORMAL HIGH (ref 6–20)
CALCIUM: 9.5 mg/dL (ref 8.9–10.3)
CO2: 22 mmol/L (ref 22–32)
CREATININE: 0.54 mg/dL — AB (ref 0.61–1.24)
Chloride: 105 mmol/L (ref 101–111)
GFR calc Af Amer: >60 mL/min (ref >60)
Glucose, Bld: 124 mg/dL — ABNORMAL HIGH (ref 65–99)
Potassium: 3.8 mmol/L (ref 3.5–5.1)
SODIUM: 133 mmol/L — AB (ref 135–145)
TOTAL PROTEIN: 7 g/dL (ref 6.5–8.1)
Total Bilirubin: 0.6 mg/dL (ref 0.3–1.2)

## 2016-06-19 LAB — CBC
HEMATOCRIT: 29.9 % — AB (ref 40.0–52.0)
Hemoglobin: 10.2 g/dL — ABNORMAL LOW (ref 13.0–18.0)
MCH: 32.6 pg (ref 26.0–34.0)
MCHC: 34.2 g/dL (ref 32.0–36.0)
MCV: 95.5 fL (ref 80.0–100.0)
PLATELETS: 203 10*3/uL (ref 150–440)
RBC: 3.13 MIL/uL — ABNORMAL LOW (ref 4.40–5.90)
RDW: 13.3 % (ref 11.5–14.5)
WBC: 10.1 10*3/uL (ref 3.8–10.6)

## 2016-06-19 LAB — GLUCOSE, CAPILLARY
Glucose-Capillary: 110 mg/dL — ABNORMAL HIGH (ref 65–99)
Glucose-Capillary: 107 mg/dL — ABNORMAL HIGH (ref 65–99)
Glucose-Capillary: 110 mg/dL — ABNORMAL HIGH (ref 65–99)
Glucose-Capillary: 90 mg/dL (ref 65–99)
Glucose-Capillary: 91 mg/dL (ref 65–99)

## 2016-06-19 LAB — PREALBUMIN: PREALBUMIN: 5.6 mg/dL — AB (ref 18–38)

## 2016-06-19 LAB — PHOSPHORUS: PHOSPHORUS: 4.2 mg/dL (ref 2.5–4.6)

## 2016-06-19 MED ORDER — PANTOPRAZOLE SODIUM 40 MG IV SOLR
40.0000 mg | INTRAVENOUS | Status: DC
  Administered 2016-06-19 – 2016-06-21 (×3): 40 mg via INTRAVENOUS
  Filled 2016-06-19 (×3): qty 40

## 2016-06-19 MED ORDER — TRACE MINERALS CR-CU-MN-SE-ZN 10-1000-500-60 MCG/ML IV SOLN
INTRAVENOUS | Status: AC
  Administered 2016-06-19: 18:00:00 via INTRAVENOUS
  Filled 2016-06-19: qty 1560

## 2016-06-19 MED ORDER — MAGNESIUM SULFATE 2 GM/50ML IV SOLN
2.0000 g | Freq: Once | INTRAVENOUS | Status: AC
  Administered 2016-06-19: 2 g via INTRAVENOUS
  Filled 2016-06-19: qty 50

## 2016-06-19 MED ORDER — FAT EMULSION 20 % IV EMUL
250.0000 mL | INTRAVENOUS | Status: AC
  Administered 2016-06-19: 18:00:00 250 mL via INTRAVENOUS
  Filled 2016-06-19: qty 250

## 2016-06-19 NOTE — Progress Notes (Signed)
Pt refusing PO medications. Attempted to educate pt about the importance of medications. Pt verbally abusive and continues to refuse night time scheduled PO medications.

## 2016-06-19 NOTE — Care Management (Signed)
Spoke with Dr. Enedina FinnerSona [name] this morning. Will order MRI. MRI studies were incomplete due to couldn't hold still even with sedation. Negative for infarct. Received Ativan IV x 1. Poor po intake. No po intake today.  Dr. Allena KatzPatel spoke with Percell LocusEnore Burnette, ( significant other). Will order Palliative care consult for goals of care and code status. Psych has signed off. States [place] Psychosis. Tele sitter in place.  TPN infusing.  Did work with physical therapy today. Gwenette GreetBrenda S Antwone Capozzoli RN MSN CCM Care Management

## 2016-06-19 NOTE — Progress Notes (Signed)
[facility] - [place] at [facility]   PATIENT NAME: Willie Baker    MR#:  [id number]  DATE OF BIRTH:  November 17, 1960  SUBJECTIVE:  Pt has been admitted since 05/24/2016. He is not showing much improvement. Thin cachectic, issues with taking po meds, intermittent agitation and now on IV TPN for nutrition. Laying in bed curled up. Does not talk with me when asked simple questions. Per RN did not take his po meds  REVIEW OF SYSTEMS:   Review of Systems  Unable to perform ROS: Dementia   Tolerating Diet:no Tolerating PT: no  DRUG ALLERGIES:   Allergies  Allergen Reactions  . Milk-Related Compounds     VITALS:  Blood pressure (!) 99/53, pulse (!) 120, temperature 98.2 F (36.8 C), temperature source Oral, resp. rate 20, height 6\' 1"  (1.854 m), weight 80.3 kg (177 lb), SpO2 99 %.  PHYSICAL EXAMINATION:   Physical Exam  GENERAL:  56 y.o.-year-old patient lying in the bed with no acute distress. Cachectic and thin, curled up in bed EYES: Pupils equal, round, reactive to light and accommodation. No scleral icterus. Extraocular muscles intact.  HEENT: Head atraumatic, normocephalic. Oropharynx and nasopharynx clear.  NECK:  Supple, no jugular venous distention. No thyroid enlargement, no tenderness.  LUNGS: Normal breath sounds bilaterally, no wheezing, rales, rhonchi. No use of accessory muscles of respiration.  CARDIOVASCULAR: S1, S2 normal. No murmurs, rubs, or gallops. Chronic tachycardia ABDOMEN: Soft, nontender, nondistended. Bowel sounds present. No organomegaly or mass.  EXTREMITIES: No cyanosis, clubbing or edema b/l.    NEUROLOGIC:unable to assess due to severe dementia PSYCHIATRIC:  patient is alert but does not communicate this morning SKIN: No obvious rash, lesion, or ulcer.   LABORATORY PANEL:  CBC  Recent Labs Lab 06/19/16 0519  WBC 10.1  HGB 10.2*  HCT 29.9*  PLT 203    Chemistries   Recent Labs Lab 06/19/16 0519  NA 133*  K 3.8   CL 105  CO2 22  GLUCOSE 124*  BUN 24*  CREATININE 0.54*  CALCIUM 9.5  MG 1.7  AST 42*  ALT 24  ALKPHOS 57  BILITOT 0.6   Cardiac Enzymes No results for input(s): TROPONINI in the last 168 hours. RADIOLOGY:  No results found. ASSESSMENT AND PLAN:  56 yo male with no past medical history presented to the ED with abdominal pain and non-bloody emesis. Admitted for persistent tachycardia.  1. 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 Pt has not shwn much improvement. Inmtermittent agitation per RN, refuses to take po meds, on TPN (which cannot be a long term ) -this likely may be pt's new baseline. -Psych has signed off -MRI brain today (will try to do it if pt cooperates, use ativan to have him stay calm) -?neurology  2. Poor po intake  -pt on tpn now however this is short term only. Will need to d/w girlfriend and other family regarding long term plan  3. hyponatremia: Sodium normal . 4. Hypokalemia/Hypomagnesemia -continue replace  5. Small bowel  obstruction resolved  6. Fever of unknown origin - remains afebrile  7.Chronic tachycardia -prn metoprolol  Overall appears a very poor prognosis given the severity of possible brain damage and dementia due to it. Will get Palliative care evaluation. Pt has no insurance and his d/c options are  very limited  Case discussed with Care Management/Social Worker.  CODE STATUS: FULL DVT Prophylaxis: lovenoc TOTAL TIME TAKING CARE OF THIS PATIENT: 30 minutes.   >50% time spent on counselling and coordination of care    Note: This dictation was prepared with Dragon dictation along with smaller phrase technology. Any transcriptional errors that result from this process are unintentional.  Tracy Kinner M.D on  06/19/2016 at 7:10 AM  Between 7am to 6pm - Pager - [phone]  After 6pm go to [email or web address] - Social research officer, governmentpassword EPAS [facility]  [facility]  Office  [phone]  CC: Primary care physician; No PCP Per Patient

## 2016-06-19 NOTE — Progress Notes (Signed)
PHARMACY - ADULT TOTAL PARENTERAL NUTRITION CONSULT NOTE   Pharmacy Consult for TPN monitoring (electrolyte and BG) Indication: ETOH related encephalopathy, malnutrition  Patient Measurements: Height: 6\' 1"  (185.4 cm) Weight: 177 lb (80.3 kg) IBW/kg (Calculated) : 79.9 TPN AdjBW (KG): 73 Body mass index is 23.35 kg/m.  Insulin requirements in the past 24 hours: none   Lytes:  BMP Latest Ref Rng & Units 06/19/2016 06/18/2016 06/17/2016  Glucose 65 - 99 mg/dL 098(J124(H) 191(Y109(H) 782(N111(H)  BUN 6 - 20 mg/dL 56(O24(H) 13(Y21(H) 12  Creatinine 0.61 - 1.24 mg/dL 8.65(H0.54(L) 8.46(N0.57(L) 6.29(B0.53(L)  Sodium 135 - 145 mmol/L 133(L) 133(L) 133(L)  Potassium 3.5 - 5.1 mmol/L 3.8 3.4(L) 3.4(L)  Chloride 101 - 111 mmol/L 105 104 101  CO2 22 - 32 mmol/L 22 23 24   Calcium 8.9 - 10.3 mg/dL 9.5 9.2 2.8(U8.8(L)   Magnesium  Date Value Ref Range Status  06/19/2016 1.7 1.7 - 2.4 mg/dL Final   Phosphorus  Date Value Ref Range Status  06/19/2016 4.2 2.5 - 4.6 mg/dL Final   3/1  TG 72   Best Practices:  TPN Access: PICC TPN start date: 2/28    Plan:   Will continue TPN Clinimix E 5/20 to 65 ml/hr as per dietary recommendation and add lipids 20% at 20 ml/hr x 12 hrs as per dietary recommendations. Will add thiamine and folic acid to each TPN.   Diet: Has soft diet ordered but poor/no intake d/t Altered mental status. Tube feeds attempted, but patient refused NG tube SSI and blood glucose checks q6h.  SSI /24hrs= 0. IV fluid regimen: None   Electrolytes:   Will order magnesium 2g IV x 1. Unless otherwise clinically indicated, will obtain follow up electrolytes on 3/7.     MLS 06/19/2016

## 2016-06-19 NOTE — Progress Notes (Signed)
No charge note.  Palliative medicine consult received.   Spoke briefly with Enore via phone. Full family meeting arranged for tomorrow 3/6. Gave Enore my contact information and she is to call me with meeting time.  Ocie BobKasie Rabiah Goeser, AGNP-C Palliative Medicine  Please call Palliative Medicine team phone with any questions [phone]. For individual providers please see AMION.

## 2016-06-19 NOTE — Progress Notes (Signed)
Physical Therapy Treatment Patient Details Name: Willie Baker MRN: [id number] DOB: 08-23-1960 Today's Date: 06/19/2016    History of Present Illness 56 y/o male here with abdominal pain, bloody vomiting, admitted with tachycardia.  Pt on CIWA protocol. He has been admitted for an extended time and is currently not making progress. Prognosis is poor due to confusion with limited po intake. Palliative care is being consulted    PT Comments    Pt is able to participate with therapy on this date but with considerable confusion. He requires heavy hand over hand assistance with activity with heavy verbal and tactile cues. He is able to complete some bed exercises but unable to follow commands for others. He requires modA+1 for bed mobility and maxA+1 for sit to stand transfers. Once upright he is only able to remain in standing for brief time due to weakness, poor balance, and confusion. Pt will benefit from skilled PT services to address deficits in strength, balance, and mobility in order to return to full function at home.     Follow Up Recommendations  SNF     Equipment Recommendations  Rolling walker with 5" wheels    Recommendations for Other Services       Precautions / Restrictions Precautions Precautions: Fall Restrictions Weight Bearing Restrictions: No    Mobility  Bed Mobility Overal bed mobility: Needs Assistance Bed Mobility: Supine to Sit;Sit to Supine     Supine to sit: Mod assist Sit to supine: Mod assist   General bed mobility comments: Pt requires heavy verbal and tactile cues. Hand over hand instruction about how to perform bed mobility. +2 required to scoot patient up toward [facility] with bed in Trendelenberg position  Transfers Overall transfer level: Needs assistance Equipment used: Rolling walker (2 wheeled) Transfers: Sit to/from Stand Sit to Stand: Max assist         General transfer comment: Pt able to perform multiple transfers with therapist.  Requires maxA+1 to come to standing and min to modA+1 to remain upright once standing. Poor bilateral knee extension with buckling of hips noted. Pt unsafe/unable to ambulate at this time due to poor balance, weakness, and poor safety awareness. Heavy verbal and tactile cues provided as well as hand over hand assistance for transfers  Ambulation/Gait                 Stairs            Wheelchair Mobility    Modified Rankin (Stroke Patients Only)       Balance Overall balance assessment: Needs assistance Sitting-balance support: Bilateral upper extremity supported Sitting balance-Leahy Scale: Poor Sitting balance - Comments: Pt repeatedly leaning/falling to the right. Requires continual bilateral UE support on bed to remain upright in sitting. Able to tolerate some brief bouts of CGA only for balance but mostly requires min to modA+1   Standing balance support: Bilateral upper extremity supported Standing balance-Leahy Scale: Poor Standing balance comment: Unable to maintain balance in standing without heavy UE support on walker                    Cognition Arousal/Alertness: Lethargic Behavior During Therapy: Impulsive Overall Cognitive Status: Impaired/Different from baseline Area of Impairment: Orientation Orientation Level: Disoriented to;Place;Time;Situation             General Comments: Pt is lethargic. Doesn't provide a great deal of spontaneous communication without prompting. At times very difficulty to understand    Exercises General Exercises - Lower  Extremity Ankle Circles/Pumps: AROM;Both;10 reps;Supine Quad Sets: Strengthening;Both;10 reps;Supine Long Arc Quad: Strengthening;Both;10 reps;Seated Heel Slides: Strengthening;Both;10 reps;Supine Straight Leg Raises: Strengthening;Both;10 reps;Supine Other Exercises Other Exercises: Practiced sitting balance with reaching in a variety of directions and alternating UEs    General Comments         Pertinent Vitals/Pain Pain Assessment: Faces Faces Pain Scale: No hurt Pain Location: Pt reports some L hip pain. Cannot rate and does not demonstrate any grimacing or groaning with movement of LLE Pain Intervention(s): Monitored during session    Home Living                      Prior Function            PT Goals (current goals can now be found in the care plan section) Acute Rehab PT Goals Patient Stated Goal: Go home and get a smoke PT Goal Formulation: Patient unable to participate in goal setting Time For Goal Achievement: 06/22/16 Potential to Achieve Goals: Fair Progress towards PT goals: Not progressing toward goals - comment (Severly limited by confusion at this time)    Frequency    Min 2X/week      PT Plan Current plan remains appropriate    Co-evaluation             End of Session Equipment Utilized During Treatment: Gait belt Activity Tolerance: Patient limited by fatigue;Other (comment) (Limited by confusion) Patient left: in bed;with call bell/phone within reach;with bed alarm set   PT Visit Diagnosis: Muscle weakness (generalized) (M62.81);Difficulty in walking, not elsewhere classified (R26.2);Unsteadiness on feet (R26.81)     Time: [id number] PT Time Calculation (min) (ACUTE ONLY): 25 min  Charges:  $Therapeutic Exercise: 8-22 mins $Therapeutic Activity: 8-22 mins                    G Codes:      Sharalyn InkJason D Supriya Beaston PT, DPT   Latiesha Harada 06/19/2016, 4:28 PM

## 2016-06-19 NOTE — Progress Notes (Signed)
Spoke t length with pt's GF enore burnette. Given overall poor prognosis willhave palliative care see pt fto work with goals of care Pt has a son [name] who will get in touch and hold a family meeting once seen by Palliative care.

## 2016-06-20 DIAGNOSIS — Z7189 Other specified counseling: Secondary | ICD-10-CM

## 2016-06-20 DIAGNOSIS — Z515 Encounter for palliative care: Secondary | ICD-10-CM

## 2016-06-20 DIAGNOSIS — R410 Disorientation, unspecified: Secondary | ICD-10-CM

## 2016-06-20 LAB — GLUCOSE, CAPILLARY
Glucose-Capillary: 95 mg/dL (ref 65–99)
Glucose-Capillary: 80 mg/dL (ref 65–99)
Glucose-Capillary: 101 mg/dL — ABNORMAL HIGH (ref 65–99)
Glucose-Capillary: 115 mg/dL — ABNORMAL HIGH (ref 65–99)

## 2016-06-20 MED ORDER — TRACE MINERALS CR-CU-MN-SE-ZN 10-1000-500-60 MCG/ML IV SOLN
INTRAVENOUS | Status: DC
  Administered 2016-06-20: 18:00:00 via INTRAVENOUS
  Filled 2016-06-20: qty 1560

## 2016-06-20 MED ORDER — FAT EMULSION 20 % IV EMUL
250.0000 mL | INTRAVENOUS | Status: AC
  Administered 2016-06-20: 250 mL via INTRAVENOUS
  Filled 2016-06-20: qty 250

## 2016-06-20 NOTE — Progress Notes (Signed)
PHARMACY - ADULT TOTAL PARENTERAL NUTRITION CONSULT NOTE   Pharmacy Consult for TPN monitoring (electrolyte and BG) Indication: ETOH related encephalopathy, malnutrition  Patient Measurements: Height: 6\' 1"  (185.4 cm) Weight: 177 lb (80.3 kg) IBW/kg (Calculated) : 79.9 TPN AdjBW (KG): 73 Body mass index is 23.35 kg/m.  Insulin requirements in the past 24 hours: none   Lytes:  BMP Latest Ref Rng & Units 06/19/2016 06/18/2016 06/17/2016  Glucose 65 - 99 mg/dL 161(W124(H) 960(A109(H) 540(J111(H)  BUN 6 - 20 mg/dL 81(X24(H) 91(Y21(H) 12  Creatinine 0.61 - 1.24 mg/dL 7.82(N0.54(L) 5.62(Z0.57(L) 3.08(M0.53(L)  Sodium 135 - 145 mmol/L 133(L) 133(L) 133(L)  Potassium 3.5 - 5.1 mmol/L 3.8 3.4(L) 3.4(L)  Chloride 101 - 111 mmol/L 105 104 101  CO2 22 - 32 mmol/L 22 23 24   Calcium 8.9 - 10.3 mg/dL 9.5 9.2 5.7(Q8.8(L)   Magnesium  Date Value Ref Range Status  06/19/2016 1.7 1.7 - 2.4 mg/dL Final   Phosphorus  Date Value Ref Range Status  06/19/2016 4.2 2.5 - 4.6 mg/dL Final   3/1  TG 72   Best Practices:  TPN Access: PICC TPN start date: 2/28    Plan:   Will continue TPN Clinimix E 5/20 to 65 ml/hr as per dietary recommendation and add lipids 20% at 20 ml/hr x 12 hrs as per dietary recommendations. Will add thiamine and folic acid to each TPN.   Diet: Has soft diet ordered but poor/no intake d/t Altered mental status. Tube feeds attempted, but patient refused NG tube SSI and blood glucose checks q6h.  SSI /24hrs= 0. IV fluid regimen: None   Electrolytes:   Will obtain follow up electrolytes on 3/7.     Clovia CuffLisa Nia Nathaniel, PharmD, BCPS 06/20/2016 11:30 AM

## 2016-06-20 NOTE — Progress Notes (Signed)
No charge note:   [facility] Called [name] and informed him of this. [name] accepted news. He asked about patient coming home with Hospice. Reviewed with him home services that Hospice can provide. He wants to talk again with Bertis RuddyEnore and Erie NoeVanessa about home vs. Residential hospice options. Note- during meeting Enore and Erie NoeVanessa expressed they did not feel they could care for patient in home.  [name] requested no pain or anxiety medication until he can discuss with Enore and Erie NoeVanessa (patient's cousin). [name] to call me after talking with them with final decision.   Ocie BobKasie Dez Stauffer, AGNP-C Palliative Medicine  Please call Palliative Medicine team phone with any questions [phone]. For individual providers please see AMION.

## 2016-06-20 NOTE — Consult Note (Addendum)
Consultation Note Date: 06/20/2016   Patient Name: Willie Baker  DOB: [birth year]  MRN: [id number]  Age / Sex: 56 y.o., male  PCP: No Pcp Per Patient Referring Physician: Fritzi Mandes, MD  Reason for Consultation: Establishing goals of care  HPI/Patient Profile: 56 y.o. male  with past medical history of chronic alcoholism admitted on 05/24/2016 with hematemesis, N/V. Workup revealed illeus and likely gastritis d/t alcohol abuse. During admission he experienced severe alcohol withdrawal requiring precedex sedation in ICU. He also became febrile requiring cooling blankets. He has poor nutritional status and is not eating and drinking- has been receiving IV TPN, but no po intake. Prealbumin yesterday was 5.6. Since coming off of sedation his mental status has not improved. He continues to experience hallucinations, moments of agitation and aggression, and is not eating, drinking, or taking po medications. Incontinent of urine and stool. He has been diagnosed with Korsakoff's dementia that has not reversed despite IV replacement of nutrition.   Clinical Assessment and Goals of Care: Met patient's significant other- Willie Baker, patient's son- Willie Baker, cousin- Willie Baker and two other family members. Discussed palliative medicine concepts and GOC. Answered their questions related to patient's diagnosis and prognosis. Prognosis is very poor due advanced dementia state and no po intake. Discussed with them that he appears to be at an end of life process and life prolonging measures are not improving his functional status or quality of life. Answered questions r/t possibility of feeding tube- discussed that feeding tube is not indicated in patients with dementia or irreversible disease processes. Discussed residential Hospice placement and option of comfort care. All agree that this is best for patient. They are concerned that they do  not want him to be in pain or "starve"- educated family re: dying process and role of food/drink as comfort. Discussed code status and all agreed DNR status.  Regarding his agitation- in discussion with my attending- Dr. Hilma Favors- agitation may be pain driven. Patient shows muscle rigidity and keeps his legs pulled to his chest. Chart review shows history of R hip, lower back and lower extremity pain. Recommend treating with low dose scheduled opioid and diazepam as this has shown some efficacy for palliative relief in Korsakoff patients.   Primary Decision Maker NEXT OF KIN - patient's son- Willie Baker with help from patient's sig other- Willie Baker    SUMMARY OF RECOMMENDATIONS -DNR -Residential hospice -Comfort  Measures (continue IV hydration and nutrition until patient is discharged) -Will start scheduled hydromorphone 9m q 4hr and diazepam 535mIV q4 hrs for help with pain and agitation   Addendum: after our conversation patient's son returned requesting to speak. He is questioning decision of comfort care, again asking about a feeding tube- reiterated that feeding tube is not medically indicated for patient and will not improve function or mental status. Son [facility] girlfriend [facility] Pa[facility]osey Prontolso joined conversation and again discussed patient's poor prognosis. KeElie Goodys having a hard time accepting his father's state and prognosis. Dr.  [name] agreed to contact [facility] to inquire about transfer, however, noted that it is unlikely they will agree to transfer. [name] stated if [facility] will not accept patient, then proceed with residential hospice. Gave [name] my contact information and Hard Choices book for review.   2nd adendum: Met again with patient's son Willie Baker and other family members present.  Gave them support as they expressed feelings of frustration regarding patient's d/c planning. Answered what questions I could re: patient's Medicaid  application. They are conflicted in the role of artificial feeding in dementia. At some points in our conversation they say patient would not want to live with feeding tube, incontinent, in nursing home- however, at other times they say as long as he is alive they wish to prolong it no matter what his quality of life is and again start asking about feeding tubes.  After extended amount of discussion, decision was made to continue current level of care and reconsult tomorrow after they have had more time to process the information. This is the first time they've been confronted with information regarding the fact that patient is likely at end of life and they are very emotional.  Palliative medicine team will met with them again tomorrow and assist this family in determining their goals of care for patient.  Time in: 1530 Time out: 1700     Code Status/Advance Care Planning:  DNR   Palliative Prophylaxis:   Delirium Protocol  Prognosis:    < 2 weeks d/t profound dementia- no po intake, significantly decreased functional status.  Discharge Planning: Hospice facility  Primary Diagnoses: Present on Admission: . Tachycardia   I have reviewed the medical record, interviewed the patient and family, and examined the patient. The following aspects are pertinent.  History reviewed. No pertinent past medical history. Social History   Social History  . Marital status: Single    Spouse name: N/A  . Number of children: N/A  . Years of education: N/A   Social History Main Topics  . Smoking status: Current Every Day Smoker  . Smokeless tobacco: Never Used  . Alcohol use Yes  . Drug use: No  . Sexual activity: Not Asked   Other Topics Concern  . None   Social History Narrative  . None   Family History  Problem Relation Age of Onset  . Breast cancer Mother    Scheduled Meds: . docusate sodium  100 mg Oral BID  . enoxaparin (LOVENOX) injection  40 mg Subcutaneous Q24H  .  feeding supplement (ENSURE ENLIVE)  237 mL Oral TID BM  . insulin aspart  0-9 Units Subcutaneous Q6H  . mouth rinse  15 mL Mouth Rinse BID  . megestrol  400 mg Oral Daily  . pantoprazole (PROTONIX) IV  40 mg Intravenous Q24H  . QUEtiapine  50 mg Oral TID  . sodium chloride flush  3 mL Intravenous Q12H  . tiotropium  18 mcg Inhalation Daily  . traZODone  150 mg Oral QHS   Continuous Infusions: . Marland KitchenTPN (CLINIMIX-E) Adult 65 mL/hr at 06/20/16 0617   PRN Meds:.acetaminophen **OR** acetaminophen, haloperidol lactate, lactulose, LORazepam, metoprolol, ondansetron **OR** ondansetron (ZOFRAN) IV Medications Prior to Admission:  Prior to Admission medications   Not on File   Allergies  Allergen Reactions  . Milk-Related Compounds    Review of Systems  Unable to perform ROS   Physical Exam  Constitutional:  cachectic  Cardiovascular:  Tachycardic, irregular  Abdominal: Soft. Bowel sounds are normal.  Musculoskeletal:  Rigidity, legs pulled to chest, follows some commands  Neurological:  Oriented to person only, confabulates, hallucinating  Psychiatric:  Agitated with physical assessment  Nursing note and vitals reviewed.   Vital Signs: BP 128/72 (BP Location: Left Arm)   Pulse (!) 113   Temp 98.3 F (36.8 C)   Resp 20   Ht _0  (1.854 m)   Wt 80.3 kg (177 lb)   SpO2 99%   BMI 23.35 kg/m  Pain Assessment: PAINAD POSS *See Group Information*: 2-Acceptable,Slightly drowsy, easily aroused Pain Score: Asleep   SpO2: SpO2: 99 % O2 Device:SpO2: 99 % O2 Flow Rate: .O2 Flow Rate (L/min): 0 L/min  IO: Intake/output summary:  Intake/Output Summary (Last 24 hours) at 06/20/16 0856 Last data filed at 06/20/16 0617  Gross per 24 hour  Intake          1551.79 ml  Output                0 ml  Net          1551.79 ml    LBM: Last BM Date: 06/18/16 Baseline Weight: Weight: 72.6 kg (160 lb) Most recent weight: Weight: 80.3 kg (177 lb)     Palliative Assessment/Data: PPS:  10%   Flowsheet Rows   Flowsheet Row Most Recent Value  Intake Tab  Referral Department  Hospitalist  Unit at Time of Referral  Oncology Unit  Palliative Care Primary Diagnosis  Neurology  Date Notified  06/19/16  Palliative Care Type  New Palliative care  Reason for referral  Clarify Goals of Care  Date of Admission  05/24/16  # of days IP prior to Palliative referral  26  Clinical Assessment  Psychosocial & Spiritual Assessment  Palliative Care Outcomes      Thank you for this consult. Palliative medicine will continue to follow and assist as needed.   Time in: 0730  Time out: 1030 Time in: 1530 Time out: 1700 Total time: 270 minutes Prolonged services billed: Yes  Greater than 50%  of this time was spent counseling and coordinating care related to the above assessment and plan.  Signed by: Mariana Kaufman, AGNP-C Palliative Medicine    Please contact Palliative Medicine Team phone at [phone] for questions and concerns.  For individual provider: See Shea Evans

## 2016-06-20 NOTE — Progress Notes (Signed)
[facility] - [place] at [facility]   PATIENT NAME: Willie Baker    MR#:  [id number]  DATE OF BIRTH:  12-16-1960  SUBJECTIVE:  Pt has been admitted since 05/24/2016. He is not showing improvement. Thin cachectic, issues with taking po meds, intermittent agitation and now on IV TPN for nutrition. Laying in bed curled up. Does not talk with me when asked simple questions. Per RN did not take his po meds Awake today but does not make sense when having conversation  REVIEW OF SYSTEMS:   Review of Systems  Unable to perform ROS: Dementia   Tolerating Diet:very little Tolerating PT: no  DRUG ALLERGIES:   Allergies  Allergen Reactions  . Milk-Related Compounds     VITALS:  Blood pressure 129/79, pulse 96, temperature 98.2 F (36.8 C), temperature source Axillary, resp. rate 18, height 6\' 1"  (1.854 m), weight 80.3 kg (177 lb), SpO2 100 %.  PHYSICAL EXAMINATION:   Physical Exam  GENERAL:  56 y.o.-year-old patient lying in the bed with no acute distress. Cachectic and thin, curled up in bed EYES: Pupils equal, round, reactive to light and accommodation. No scleral icterus. Extraocular muscles intact.  HEENT: Head atraumatic, normocephalic. Oropharynx and nasopharynx clear.  NECK:  Supple, no jugular venous distention. No thyroid enlargement, no tenderness.  LUNGS: Normal breath sounds bilaterally, no wheezing, rales, rhonchi. No use of accessory muscles of respiration.  CARDIOVASCULAR: S1, S2 normal. No murmurs, rubs, or gallops. Chronic tachycardia ABDOMEN: Soft, nontender, nondistended. Bowel sounds present. No organomegaly or mass.  EXTREMITIES: No cyanosis, clubbing or edema b/l.    NEUROLOGIC:unable to assess due to severe dementia PSYCHIATRIC:  patient is alert but does not communicate this morning SKIN: No obvious rash, lesion, or ulcer.   LABORATORY PANEL:  CBC  Recent Labs Lab 06/19/16 0519  WBC 10.1  HGB 10.2*  HCT 29.9*  PLT 203     Chemistries   Recent Labs Lab 06/19/16 0519  NA 133*  K 3.8  CL 105  CO2 22  GLUCOSE 124*  BUN 24*  CREATININE 0.54*  CALCIUM 9.5  MG 1.7  AST 42*  ALT 24  ALKPHOS 57  BILITOT 0.6   Cardiac Enzymes No results for input(s): TROPONINI in the last 168 hours. RADIOLOGY:  Mr Brain Wo Contrast  Result Date: 06/19/2016 CLINICAL DATA:  Acute encephalopathy. EXAM: MRI HEAD WITHOUT CONTRAST TECHNIQUE: Multiplanar, multiecho pulse sequences of the brain and surrounding structures were obtained without intravenous contrast. COMPARISON:  MRI head 06/09/2016 FINDINGS: Brain: Image quality degraded by motion. The patient was sedated for the study however could not still. A partial study was obtained. Negative for acute infarct. Moderate atrophy. No mass or fluid collection identified. No shift of the midline structures. Vascular: Not adequately evaluated due to motion. Skull and upper cervical spine: Negative Sinuses/Orbits: Negative Other: None IMPRESSION: Incomplete study. The patient was sedated but not able to hold still or complete the study Negative for acute infarct. Electronically Signed   By: Marlan Palau M.D.   On: 06/19/2016 10:32   ASSESSMENT AND PLAN:  56 yo male with no past medical history presented to the ED with abdominal pain and non-bloody emesis. Admitted for persistent tachycardia.  1. 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 Pt has not shown much improvement. Inmtermittent agitation per RN, refuses to take  po meds, on TPN (which cannot be a long term ) -this likely may be pt's new baseline. -Psych has signed off -MRI brain shows nothing acute. Limited due to pt motion  2. Poor po intake  -pt on tpn now however this is short term only. Will need to d/w girlfriend and other family regarding  long term plan  3. hyponatremia: Sodium normal . 4. Hypokalemia/Hypomagnesemia -continue replace  5. Small bowel  obstruction resolved  6. Fever of unknown origin - remains afebrile  7.Chronic tachycardia -prn metoprolol  Overall appears a very poor prognosis given the severity of possible brain damage and dementia due to it. appreciate Palliative care evaluation. Spoke with pt's son [name] and discussed overall very poor long term prognosis. Addressed questions regarding feeding tube, pt's overall severe dementia also. [facility] Spoke with Dr Tresa GarterSasaki-Adams with Internal medicine and pt's [facility] anything to [facility]'s care from what was done here. Son aware of it. Awaiting now for son to make decision regarding d/c planning. Hospice facility vs home with hospice.  Case discussed with Care Management/Social Worker.  CODE STATUS: FULL DVT Prophylaxis: lovenoc TOTAL TIME TAKING CARE OF THIS PATIENT: 30 minutes.   >50% time spent on counselling and coordination of care    Note: This dictation was prepared with Dragon dictation along with smaller phrase technology. Any transcriptional errors that result from this process are unintentional.  Marcques Wrightsman M.D on 06/20/2016 at 5:01 PM  Between 7am to 6pm - Pager - [phone]  After 6pm go to [email or web address] - Social research officer, governmentpassword EPAS [facility]  [facility] [place] [facility]  Office  [phone]  CC: Primary care physician; No PCP Per Patient

## 2016-06-20 NOTE — Progress Notes (Signed)
Nutrition Follow-up  DOCUMENTATION CODES:   Severe malnutrition in context of acute illness/injury  INTERVENTION:  Continue Clinimix E 5/20 @ 65 ml/hr + 20% ILE @ 20 ml/hr over 12 hours.   Per chart family likely to pursue comfort care. Plan is to continue IV fluids and TPN until discharge. When patient discharges recommend tapering TPN to 1/2 rate (~30 ml/hr) for 2 hours before discontinuing TPN. Recommend checking a CBG after discontinuing TPN.  Continue Ensure Enlive po TID, each supplement provides 350 kcal and 20 grams of protein.   NUTRITION DIAGNOSIS:   Malnutrition (Moderate) related to chronic illness, poor appetite (abdominal pain, NV) as evidenced by energy intake < 75% for > or equal to 1 month, moderate depletions of muscle mass, moderate depletion of body fat.  Ongoing - addressing with TPN.   GOAL:   Patient will meet greater than or equal to 90% of their needs  Met with TPN.  MONITOR:   PO intake, Supplement acceptance, Labs, I & O's, Weight trends  REASON FOR ASSESSMENT:   Malnutrition Screening Tool    ASSESSMENT:   56 year old male with no PMHx presented with abdominal pain and non-bloody emesis admitted for persistent tachycardia.   -On 3/2 RN attempted placement of small-bore feeding tube, but after starting to insert NG tube patient changed his mind and then refused placement of NG tube. -PMT team met with patient's family today. Plan is to pursue comfort measures with transfer to hospice home vs home with hospice. Prognosis < 2 weeks. Per chart plan is to continue IV hydration and nutrition until patient is discharged.  TPN: Tolerating goal regimen of Clinimix E 5/20 @ 65 ml/hr + 20% ILE @ 20 ml/hr over 12 hours.  Meal Completion: 0%  Medications reviewed and include: Colace, Novolog sliding scale Q6hrs (none received past 24 hrs), Megace 400 mg daily.  Labs reviewed: CBG 80-110 past 24 hrs, Sodium 133, BUN 24, Creatinine 0.54  Discussed with  RN.   Diet Order:  DIET SOFT Room service appropriate? Yes; Fluid consistency: Thin .TPN (CLINIMIX-E) Adult TPN (CLINIMIX-E) Adult  Skin:  Reviewed, no issues  Last BM:  06/16/2016  Height:   Ht Readings from Last 1 Encounters:  06/16/16 6' 1"  (1.854 m)    Weight:   Wt Readings from Last 1 Encounters:  06/19/16 177 lb (80.3 kg)    Ideal Body Weight:  83.6 kg  BMI:  Body mass index is 23.35 kg/m.  Estimated Nutritional Needs:   Kcal:  1900-2065 (MSJ x 1.2-1.3)  Protein:  70-83 grams (1-1.2 grams/kg)  Fluid:  2 L/day (30 ml/kg)  EDUCATION NEEDS:   No education needs identified at this time  Willey Blade, MS, RD, LDN Pager: [phone] After Hours Pager: [phone]

## 2016-06-21 LAB — BASIC METABOLIC PANEL WITH GFR
Anion gap: 7 (ref 5–15)
BUN: 15 mg/dL (ref 6–20)
CO2: 24 mmol/L (ref 22–32)
Calcium: 9.7 mg/dL (ref 8.9–10.3)
Chloride: 103 mmol/L (ref 101–111)
Creatinine, Ser: 0.46 mg/dL — ABNORMAL LOW (ref 0.61–1.24)
GFR calc Af Amer: >60 mL/min
GFR calc non Af Amer: >60 mL/min
Glucose, Bld: 98 mg/dL (ref 65–99)
Potassium: 3.8 mmol/L (ref 3.5–5.1)
Sodium: 134 mmol/L — ABNORMAL LOW (ref 135–145)

## 2016-06-21 LAB — GLUCOSE, CAPILLARY
Glucose-Capillary: 97 mg/dL (ref 65–99)
Glucose-Capillary: 98 mg/dL (ref 65–99)
Glucose-Capillary: 97 mg/dL (ref 65–99)

## 2016-06-21 MED ORDER — LORAZEPAM 2 MG/ML IJ SOLN
2.0000 mg | Freq: Four times a day (QID) | INTRAMUSCULAR | Status: DC
  Filled 2016-06-21: qty 1

## 2016-06-21 MED ORDER — LORAZEPAM 2 MG/ML IJ SOLN: 2.0000 mg | INTRAMUSCULAR | Status: DC

## 2016-06-21 MED ORDER — HYDROMORPHONE HCL 1 MG/ML IJ SOLN
0.5000 mg | INTRAMUSCULAR | Status: DC
  Administered 2016-06-21: 0.5 mg via INTRAVENOUS
  Filled 2016-06-21 (×2): qty 0.5

## 2016-06-21 MED ORDER — HYDROMORPHONE HCL 1 MG/ML IJ SOLN
0.5000 mg | INTRAMUSCULAR | Status: AC
  Administered 2016-06-21: 10:00:00 0.5 mg via INTRAVENOUS
  Filled 2016-06-21: qty 0.5

## 2016-06-21 MED ORDER — LORAZEPAM 2 MG/ML IJ SOLN
2.0000 mg | Freq: Once | INTRAMUSCULAR | Status: AC
  Administered 2016-06-22: 2 mg via INTRAVENOUS
  Filled 2016-06-21: qty 1

## 2016-06-21 MED ORDER — DIAZEPAM 5 MG/ML IJ SOLN: 5.0000 mg | INTRAMUSCULAR | Status: DC | PRN

## 2016-06-21 MED ORDER — HYDROMORPHONE HCL 1 MG/ML IJ SOLN: 0.5000 mg | INTRAMUSCULAR | Status: DC | PRN

## 2016-06-21 MED ORDER — HYDROMORPHONE HCL 1 MG/ML IJ SOLN
0.5000 mg | INTRAMUSCULAR | Status: DC
  Administered 2016-06-21 – 2016-06-23 (×11): 0.5 mg via INTRAVENOUS
  Filled 2016-06-21 (×11): qty 0.5

## 2016-06-21 MED ORDER — LORAZEPAM 2 MG/ML IJ SOLN
2.0000 mg | INTRAMUSCULAR | Status: DC
  Administered 2016-06-21 – 2016-06-22 (×4): 2 mg via INTRAVENOUS
  Filled 2016-06-21 (×4): qty 1

## 2016-06-21 MED ORDER — DIAZEPAM 5 MG PO TABS: 5.0000 mg | ORAL_TABLET | Freq: Four times a day (QID) | ORAL | Status: DC | PRN

## 2016-06-21 MED ORDER — DIAZEPAM 5 MG/ML PO CONC: 5.0000 mg | Freq: Four times a day (QID) | ORAL | Status: DC | PRN

## 2016-06-21 MED ORDER — TRACE MINERALS CR-CU-MN-SE-ZN 10-1000-500-60 MCG/ML IV SOLN
INTRAVENOUS | Status: DC
  Administered 2016-06-21: 18:00:00 via INTRAVENOUS
  Filled 2016-06-21: qty 1560

## 2016-06-21 MED ORDER — FAT EMULSION 20 % IV EMUL
250.0000 mL | INTRAVENOUS | Status: DC
  Administered 2016-06-21: 19:00:00 250 mL via INTRAVENOUS
  Filled 2016-06-21: qty 250

## 2016-06-21 NOTE — Progress Notes (Signed)
[facility] - [place] at [facility]   PATIENT NAME: Willie Baker    MR#:  [id number]  DATE OF BIRTH:  June 03, 1960  SUBJECTIVE:  Per nursing patient had been very agitated, requiring IV lorazepam and IV hydromorphone REVIEW OF SYSTEMS:   Review of Systems  Unable to perform ROS: Dementia   Tolerating Diet:very little Tolerating PT: no  DRUG ALLERGIES:   Allergies  Allergen Reactions  . Milk-Related Compounds     VITALS:  Blood pressure 136/86, pulse (!) 115, temperature 97.5 F (36.4 C), temperature source Axillary, resp. rate 16, height 6\' 1"  (1.854 m), weight 80 kg (176 lb 5.9 oz), SpO2 100 %. PHYSICAL EXAMINATION:   Physical Exam  GENERAL:  56 y.o.-year-old patient lying in the bed with no acute distress. Cachectic and thin, curled up in bed EYES: Pupils equal, round, reactive to light and accommodation. No scleral icterus. Extraocular muscles intact.  HEENT: Head atraumatic, normocephalic. Oropharynx and nasopharynx clear.  NECK:  Supple, no jugular venous distention. No thyroid enlargement, no tenderness.  LUNGS: Normal breath sounds bilaterally, no wheezing, rales, rhonchi. No use of accessory muscles of respiration.  CARDIOVASCULAR: S1, S2 normal. No murmurs, rubs, or gallops. Chronic tachycardia ABDOMEN: Soft, nontender, nondistended. Bowel sounds present. No organomegaly or mass.  EXTREMITIES: No cyanosis, clubbing or edema b/l.    NEUROLOGIC:unable to assess due to severe dementia PSYCHIATRIC:  patient is sleepy after Ativan SKIN: No obvious rash, lesion, or ulcer.   LABORATORY PANEL:  CBC  Recent Labs Lab 06/19/16 0519  WBC 10.1  HGB 10.2*  HCT 29.9*  PLT 203    Chemistries   Recent Labs Lab 06/19/16 0519 06/21/16 1244  NA 133* 134*  K 3.8 3.8  CL 105 103  CO2 22 24  GLUCOSE 124* 98  BUN 24* 15  CREATININE 0.54* 0.46*  CALCIUM 9.5 9.7  MG 1.7  --   AST 42*  --   ALT 24  --   ALKPHOS 57  --   BILITOT 0.6  --     Cardiac Enzymes No results for input(s): TROPONINI in the last 168 hours. RADIOLOGY:  No results found. ASSESSMENT AND PLAN:  56 yo male with no past medical history presented to the ED with abdominal pain and non-bloody emesis. Admitted for persistent tachycardia.  1. 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 Pt has not shown much improvement. Inmtermittent agitation per RN, refuses to take po meds, on TPN (which cannot be a long term ) -this likely may be pt's new baseline. -Psych has signed off -MRI brain shows nothing acute. Limited due to pt motion  2. Poor po intake  -pt on tpn now however this is short term only. Will need to d/w girlfriend and other family regarding long term plan. - Palliative care tried to talk with family and I was conveyed that the family wants feeding tube which is very unrealistic  -Discussed with palliative care team and have asked him to have further discussion again with the family -I do not think patient is a candidate for feeding tube and/or long-term TPN  3. hyponatremia: Sodium normal . 4. Hypokalemia/Hypomagnesemia -continue replace  5. Small bowel  obstruction resolved  6. Fever of unknown origin - remains afebrile  7.Chronic tachycardia -prn metoprolol  Overall appears a very poor prognosis given the severity  of possible brain damage and dementia due to it. appreciate Palliative care following and having ongoing discussions with family.  Family is unreachable today (Dr Allena KatzPatel spoke with pt's son [name] y'day and discussed overall very poor long term prognosis. Addressed questions regarding feeding tube, pt's overall severe dementia also. [facility] Spoke with Dr Tresa GarterSasaki-Adams with Internal medicine and pt's [facility] was [facility] not going [facility] add anything to pt's care from what was done here. Son aware of it.)  Awaiting now for son to make decision regarding d/c planning. Hospice facility vs home with hospice.  Patient seems very appropriate for residential hospice  Case discussed with Care Management/Social Worker.  CODE STATUS: DNR DVT Prophylaxis: lovenoc TOTAL TIME TAKING CARE OF THIS PATIENT: 30 minutes.   >50% time spent on counselling and coordination of care    Note: This dictation was prepared with Dragon dictation along with smaller phrase technology. Any transcriptional errors that result from this process are unintentional.  Delfino LovettVipul Georgenia Salim M.D on 06/21/2016 at 4:43 PM  Between 7am to 6pm - Pager - [phone]  After 6pm go to [email or web address] - Social research officer, governmentpassword EPAS [facility]  [facility]  Office  [phone]  CC: Primary care physician; No PCP Per Patient

## 2016-06-21 NOTE — Progress Notes (Signed)
Patient intermittently agitated w/ physical and verbal aggression- threatening staff, attempting to kick and hit staff as well.  Patient not taking anything by mouth today from staff- offered at different interval throughout the day. Patient medication regimen changed by Palliative NP due to agitation- Unable to obtain CBG's and Blood pressure, however HCP made aware and attempts to collect vitals made when patient calm. Patient still incontinent and attempting to get out of bed numerous times today. Unable to complete full head to toe assessment due to patient participation, cooperation and agitation. Family/ Friends/Visitors in to see patient this evening. Patient drank approx 600ml of fluid from girlfriend during her visit this evening but was still noted to be very agitated and verbally aggressive during visit. MD, Sherryll BurgerShah updated on patient status and Palliative NP Mahan aware of patient status through the day as well. Report given to oncoming night RN, Heloise Ochoarusilla, RN

## 2016-06-21 NOTE — Progress Notes (Addendum)
PHARMACY - ADULT TOTAL PARENTERAL NUTRITION CONSULT NOTE   Pharmacy Consult for TPN monitoring (electrolyte and BG) Indication: ETOH related encephalopathy, malnutrition  Patient Measurements: Height: 6\' 1"  (185.4 cm) Weight: 176 lb 5.9 oz (80 kg) IBW/kg (Calculated) : 79.9 TPN AdjBW (KG): 73 Body mass index is 23.27 kg/m.  Insulin requirements in the past 24 hours: none   Lytes:  BMP Latest Ref Rng & Units 06/19/2016 06/18/2016 06/17/2016  Glucose 65 - 99 mg/dL 161(W124(H) 960(A109(H) 540(J111(H)  BUN 6 - 20 mg/dL 81(X24(H) 91(Y21(H) 12  Creatinine 0.61 - 1.24 mg/dL 7.82(N0.54(L) 5.62(Z0.57(L) 3.08(M0.53(L)  Sodium 135 - 145 mmol/L 133(L) 133(L) 133(L)  Potassium 3.5 - 5.1 mmol/L 3.8 3.4(L) 3.4(L)  Chloride 101 - 111 mmol/L 105 104 101  CO2 22 - 32 mmol/L 22 23 24   Calcium 8.9 - 10.3 mg/dL 9.5 9.2 5.7(Q8.8(L)   Magnesium  Date Value Ref Range Status  06/19/2016 1.7 1.7 - 2.4 mg/dL Final   Phosphorus  Date Value Ref Range Status  06/19/2016 4.2 2.5 - 4.6 mg/dL Final   3/1  TG 72   Best Practices:  TPN Access: PICC TPN start date: 2/28    Plan:   Will continue TPN Clinimix E 5/20 to 65 ml/hr as per dietary recommendation and add lipids 20% at 20 ml/hr x 12 hrs as per dietary recommendations. Will add thiamine and folic acid to each TPN.   Diet: Has soft diet ordered but poor/no intake d/t Altered mental status. Tube feeds attempted, but patient refused NG tube SSI and blood glucose checks q6h.  SSI /24hrs= 0. IV fluid regimen: None   Electrolytes:   Will obtain follow up electrolytes on 3/12.     MLS 06/21/2016 12:34 PM

## 2016-06-21 NOTE — Clinical Social Work Note (Signed)
CSW reconsulted for residential hospice placement. Palliative Care is following as well. Per Palliative Care, family has not made a decision on whether or not to move forward with residential hospice placement. CSW staffed this case with Asst. Director, and if family chooses to continue TPN, then pt will need a Difficult to Place bed, due to TRN and LOG needs.   At time to of note, pt still has telesitter and has been agitated this morning and required medication. CSW will continue to follow.   Dede QuerySarah Kazuko Clemence, MSW, LCSW  Clinical Social Worker  [phone]

## 2016-06-21 NOTE — Progress Notes (Signed)
PT Cancellation Note  Patient Details Name: Willie Baker MRN: [id number] DOB: 02/11/1961   Cancelled Treatment:    Reason Eval/Treat Not Completed: Medical issues which prohibited therapy. Chart reviewed. Pt has demonstrated some aggression toward staff this AM. Per palliative NP note, please hold any interventions that cause agitation. Pt currently not appropriate for PT treatment. It appears that he continues to decline and prognosis remains very poor. PMT continuing to define plan of care with vamily. If PT is no longer indicated please discontinue current order.   Sharalyn InkJason D Mayling Aber PT, DPT   Gesenia Bantz 06/21/2016, 9:44 AM

## 2016-06-21 NOTE — Progress Notes (Addendum)
Daily Progress Note   Patient Name: Willie Baker       Date: 06/21/2016 DOB: 08-07-1960  Age: 56 y.o. MRN#: [id number] Attending Physician: Delfino Lovett, MD Primary Care Physician: No PCP Per Patient Admit Date: 05/24/2016  Reason for Consultation/Follow-up: Establishing goals of care and Non pain symptom management  Subjective: Patient agitated and aggressive again this morning despite having a good day yesterday requiring no PRN's until late last night. This morning he is threatening staff and showing some physical aggression. Will not take po meds and will not allow any care interventions. Family not at bedside. Patient observed lying in bed on his side, legs pulled up to his chest. Will not keep clothes on. When I ask him questions he responds with aggression. He has received lorazepam 2mg  IV about an hour prior and per RN report he is calmer now than he was, but still agitated.Observed him attempting to hit staff while they tried to check his vital signs. Would try IV diazepam, however, IV diazepam is on backorder. Will schedule IV lorazepam. I do have concern that he is in pain as he continues to lie with furrowed brow, legs pulled to his chest. Will order .5 hydromorphone IV now and consider scheduling if this provides him with some relief.    Addendum: Returned to bedside to evaluate patient after receiving hydromorphone. He appeared to be more comfortable than I have seen him this entire admission. Will schedule hydromorphone and change lorazepam to prn.  Review of Systems  All other systems reviewed and are negative.   Length of Stay: 26  Current Medications: Scheduled Meds:  . docusate sodium  100 mg Oral BID  . enoxaparin (LOVENOX) injection  40 mg Subcutaneous Q24H  . feeding  supplement (ENSURE ENLIVE)  237 mL Oral TID BM  .  HYDROmorphone (DILAUDID) injection  0.5 mg Intravenous NOW  . insulin aspart  0-9 Units Subcutaneous Q6H  . LORazepam  2 mg Intravenous Q4H  . mouth rinse  15 mL Mouth Rinse BID  . megestrol  400 mg Oral Daily  . pantoprazole (PROTONIX) IV  40 mg Intravenous Q24H  . QUEtiapine  50 mg Oral TID  . sodium chloride flush  3 mL Intravenous Q12H  . tiotropium  18 mcg Inhalation Daily  . traZODone  150 mg  Oral QHS    Continuous Infusions: . Marland Kitchen.TPN (CLINIMIX-E) Adult 65 mL/hr at 06/20/16 1756    PRN Meds: acetaminophen **OR** acetaminophen, diazepam, haloperidol lactate, HYDROmorphone (DILAUDID) injection, lactulose, metoprolol, ondansetron **OR** ondansetron (ZOFRAN) IV  Physical Exam  Constitutional: No distress.  cachectic  HENT:  Head: Normocephalic and atraumatic.  Pulmonary/Chest: Effort normal.  Neurological: He is alert.  Not oriented  Skin: Skin is warm and dry. There is pallor.  Psychiatric:  Agitated, agressive  Nursing note and vitals reviewed.         Patient refuses physical assessment.  Vital Signs: BP 136/86 (BP Location: Left Arm)   Pulse (!) 116   Temp 97.9 F (36.6 C) (Oral)   Resp 19   Ht 6\' 1"  (1.854 m)   Wt 80 kg (176 lb 5.9 oz)   SpO2 91%   BMI 23.27 kg/m  SpO2: SpO2: 91 % O2 Device: O2 Device: Not Delivered O2 Flow Rate: O2 Flow Rate (L/min): 0 L/min  Intake/output summary:  Intake/Output Summary (Last 24 hours) at 06/21/16 [id number] Last data filed at 06/21/16 0900  Gross per 24 hour  Intake          1912.26 ml  Output                0 ml  Net          1912.26 ml   LBM: Last BM Date: 06/18/16 Baseline Weight: Weight: 72.6 kg (160 lb) Most recent weight: Weight: 80 kg (176 lb 5.9 oz)       Palliative Assessment/Data: PPS: 10%   Flowsheet Rows   Flowsheet Row Most Recent Value  Intake Tab  Referral Department  Hospitalist  Unit at Time of Referral  Oncology Unit  Palliative Care Primary  Diagnosis  Neurology  Date Notified  06/19/16  Palliative Care Type  New Palliative care  Reason for referral  Clarify Goals of Care  Date of Admission  05/24/16  # of days IP prior to Palliative referral  26  Clinical Assessment  Psychosocial & Spiritual Assessment  Palliative Care Outcomes      Patient Active Problem List   Diagnosis Date Noted  . Palliative care by specialist   . Goals of care, counseling/discussion   . Confusion and disorientation   . Advance care planning   . Protein-calorie malnutrition, severe 06/14/2016  . 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 Alcohol abuse 06/08/2016  . Abdominal pain of unknown etiology   . Hyponatremia   . SBO (small bowel obstruction)   . Ileus (HCC)   . Tachycardia 05/25/2016    Palliative Care Assessment & Plan   Patient Profile:  56 y.o. male  with past medical history of chronic alcoholism admitted on 05/24/2016 with hematemesis, N/V. Workup revealed illeus and likely gastritis d/t alcohol abuse. During admission he experienced severe alcohol withdrawal requiring precedex sedation in ICU. He also became febrile requiring cooling blankets. He has poor nutritional status and is not eating and drinking- has been receiving IV TPN, but no po intake. Prealbumin yesterday was 5.6. Since coming off of sedation his mental status has not improved. He continues to experience hallucinations, moments of agitation and aggression, and is not eating, drinking, or taking po medications. Incontinent of urine and stool. He has been diagnosed with Korsakoff's dementia that has not reversed despite IV replacement of nutrition. He remains agitated, showing aggression towards staff, refusing medical interventions.  Assessment/Recommendations/Plan   Lorazepam 2mg  IV q4hr  Hydromorphone .5mg  IV  now- will schedule if this helps  Delirium precautions  Hold any interventions that cause agitation including CBG's and vitals  PMT will  reconsult with family for GOC  Goals of Care and Additional Recommendations:  Limitations on Scope of Treatment: Minimize Medications  Code Status:  DNR  Prognosis:   Unable to determine  Discharge Planning:  To Be Determined  Care plan was discussed with Dr. Angelique Blonder, RN  Thank you for allowing the Palliative Medicine Team to assist in the care of this patient.  Time In: 0800  Time Out: 0900  Greater than 50%  of this time was spent counseling and coordinating care related to the above assessment and plan.  Ocie Bob, AGNP-C Palliative Medicine   Please contact Palliative Medicine Team phone at [phone] for questions and concerns.

## 2016-06-22 DIAGNOSIS — F99 Mental disorder, not otherwise specified: Secondary | ICD-10-CM

## 2016-06-22 DIAGNOSIS — E43 Unspecified severe protein-calorie malnutrition: Secondary | ICD-10-CM

## 2016-06-22 LAB — GLUCOSE, CAPILLARY
Glucose-Capillary: 99 mg/dL (ref 65–99)
Glucose-Capillary: 91 mg/dL (ref 65–99)
Glucose-Capillary: 103 mg/dL — ABNORMAL HIGH (ref 65–99)

## 2016-06-22 MED ORDER — FAT EMULSION 20 % IV EMUL
250.0000 mL | INTRAVENOUS | Status: DC
  Filled 2016-06-22: qty 250

## 2016-06-22 MED ORDER — SODIUM CHLORIDE 0.9% FLUSH: 3.0000 mL | INTRAVENOUS | Status: DC | PRN

## 2016-06-22 MED ORDER — GLYCOPYRROLATE 1 MG PO TABS: 1.0000 mg | ORAL_TABLET | ORAL | Status: DC | PRN

## 2016-06-22 MED ORDER — GLYCOPYRROLATE 0.2 MG/ML IJ SOLN: 0.2000 mg | INTRAMUSCULAR | Status: DC | PRN

## 2016-06-22 MED ORDER — SODIUM CHLORIDE 0.9% FLUSH
3.0000 mL | Freq: Two times a day (BID) | INTRAVENOUS | Status: DC
  Administered 2016-06-22 – 2016-06-23 (×2): 3 mL via INTRAVENOUS

## 2016-06-22 MED ORDER — TRACE MINERALS CR-CU-MN-SE-ZN 10-1000-500-60 MCG/ML IV SOLN
INTRAVENOUS | Status: DC
  Filled 2016-06-22: qty 1560

## 2016-06-22 MED ORDER — SODIUM CHLORIDE 0.9 % IV SOLN: 250.0000 mL | INTRAVENOUS | Status: DC | PRN

## 2016-06-22 MED ORDER — LORAZEPAM 2 MG/ML IJ SOLN
2.0000 mg | INTRAMUSCULAR | Status: DC
  Administered 2016-06-22 – 2016-06-23 (×6): 2 mg via INTRAVENOUS
  Filled 2016-06-22 (×7): qty 1

## 2016-06-22 NOTE — Plan of Care (Signed)
Problem: Education: Goal: Knowledge of [place] [facility] Education information/materials will improve Outcome: Not Progressing Pt shows no signs of learning  Problem: Safety: Goal: Ability to remain free from injury will improve Outcome: Progressing Pt has telesitter   Problem: Activity: Goal: Risk for activity intolerance will decrease Outcome: Not Progressing Pt unable to ambulate  Problem: Nutrition: Goal: Adequate nutrition will be maintained Outcome: Not Progressing Pt on TPN at 3165ml/hr but little to no by mouth intake.  Problem: Education: Goal: Knowledge of disease or condition will improve Outcome: Not Progressing Pt shows no signs of learning

## 2016-06-22 NOTE — Progress Notes (Signed)
[facility] - [place] at [facility]   PATIENT NAME: Willie Baker    MR#:  [id number]  DATE OF BIRTH:  15-Feb-1961  SUBJECTIVE:  Same, family unable to be reached REVIEW OF SYSTEMS:   Review of Systems  Unable to perform ROS: Dementia   Tolerating Diet:very little Tolerating PT: no DRUG ALLERGIES:   Allergies  Allergen Reactions  . Milk-Related Compounds    VITALS:  Blood pressure 109/60, pulse (!) 110, temperature 98 F (36.7 C), temperature source Oral, resp. rate 20, height 6\' 1"  (1.854 m), weight 73.9 kg (163 lb), SpO2 99 %. PHYSICAL EXAMINATION:   Physical Exam  GENERAL:  56 y.o.-year-old patient lying in the bed with no acute distress. Cachectic and thin, curled up in bed EYES: Pupils equal, round, reactive to light and accommodation. No scleral icterus. Extraocular muscles intact.  HEENT: Head atraumatic, normocephalic. Oropharynx and nasopharynx clear.  NECK:  Supple, no jugular venous distention. No thyroid enlargement, no tenderness.  LUNGS: Normal breath sounds bilaterally, no wheezing, rales, rhonchi. No use of accessory muscles of respiration.  CARDIOVASCULAR: S1, S2 normal. No murmurs, rubs, or gallops. Chronic tachycardia ABDOMEN: Soft, nontender, nondistended. Bowel sounds present. No organomegaly or mass.  EXTREMITIES: No cyanosis, clubbing or edema b/l.    NEUROLOGIC:unable to assess due to severe dementia PSYCHIATRIC:  patient is sleepy after Ativan SKIN: No obvious rash, lesion, or ulcer.  LABORATORY PANEL:  CBC  Recent Labs Lab 06/19/16 0519  WBC 10.1  HGB 10.2*  HCT 29.9*  PLT 203    Chemistries   Recent Labs Lab 06/19/16 0519 06/21/16 1244  NA 133* 134*  K 3.8 3.8  CL 105 103  CO2 22 24  GLUCOSE 124* 98  BUN 24* 15  CREATININE 0.54* 0.46*  CALCIUM 9.5 9.7  MG 1.7  --   AST 42*  --   ALT 24  --   ALKPHOS 57  --   BILITOT 0.6  --    Cardiac Enzymes No results for input(s): TROPONINI in the last 168  hours. RADIOLOGY:  No results found. ASSESSMENT AND PLAN:  56 yo male with no past medical history presented to the ED with abdominal pain and non-bloody emesis. Admitted for persistent tachycardia.  1. 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 Pt has not shown much improvement. Inmtermittent agitation per RN, refuses to take po meds, on TPN (which cannot be a long term ) -this likely may be pt's new baseline. -Psych has signed off -MRI brain shows nothing acute. Limited due to pt motion - Patient intermittently agitated w/ physical and verbal aggression- threatening staff, attempting to kick and hit staff as well.  Patient not taking anything by mouth today from staff  2. Poor po intake  -pt on tpn now however this is short term only. Will need to d/w girlfriend and other family regarding long term plan. - Palliative care tried to talk with family and I was conveyed that the family wants feeding tube which is very unrealistic  -Discussed with palliative care team and have asked to continue trying to have discussion with son (rest of the family seemed to be in agreement for hospice except son who unfortunately has healthcare power of attorney) -I do not think patient is a candidate for feeding tube and/or long-term TPN  3. hyponatremia: Sodium normal .  4. Hypokalemia/Hypomagnesemia -continue replace  5. Small bowel  obstruction resolved  6. Fever of unknown origin - remains afebrile  7.Chronic tachycardia -prn metoprolol    Overall appears a very poor prognosis given the severity of possible brain damage and dementia due to it. appreciate Palliative care following and having ongoing discussions with family.  Family is unreachable.   Awaiting now for son to make decision regarding d/c planning.  Hospice facility vs home with hospice.  Patient seems very appropriate for residential hospice  Case discussed with Care Management/Social Worker.  CODE STATUS: DNR DVT Prophylaxis: lovenox TOTAL TIME TAKING CARE OF THIS PATIENT: 20 minutes.   >50% time spent on counselling and coordination of care    Note: This dictation was prepared with Dragon dictation along with smaller phrase technology. Any transcriptional errors that result from this process are unintentional.  Delfino LovettVipul Stellar Gensel M.D on 06/22/2016 at 10:27 AM  Between 7am to 6pm - Pager - [phone]  After 6pm go to [email or web address] - Social research officer, governmentpassword [email or web address]  [facility] [place] [facility]  Office  [phone]  CC: Primary care physician; No PCP Per Patient

## 2016-06-22 NOTE — Plan of Care (Signed)
Problem: Nutrition: Goal: Adequate nutrition will be maintained Outcome: Not Progressing Pt still on TPN at 65 ml/hr.

## 2016-06-22 NOTE — Clinical Social Work Note (Signed)
CSW, Palliative Care NP, and Attending MD participated in a family meeting. Discussed with pt's son regarding goals of care and prognosis at great length. The was made transition to comfort care and then to residential Hospice. [facility]. [facility], who will meet with family. [facility] and it has been reserved. CSW will continue to follow.   Dede QuerySarah Rhapsody Wolven, MSW, LCSW  Clinical Social Worker  [phone]

## 2016-06-22 NOTE — Progress Notes (Signed)
PT Cancellation Note  Patient Details Name: Willie Baker MRN: [id number] DOB: 09/11/1960   Cancelled Treatment:    Reason Eval/Treat Not Completed: Medical issues which prohibited therapy. Chart reviewed. Spoke with PMT who reported that pt is still confused and has been agitated. Per PMT he is not currently appropriate for PT treatment. Will attempt PT treatment on later date when pt is appropriate.    Sharalyn InkJason D Huprich PT, DPT  Huprich,Jason 06/22/2016, 12:10 PM

## 2016-06-22 NOTE — Progress Notes (Signed)
PHARMACY - ADULT TOTAL PARENTERAL NUTRITION CONSULT NOTE   Pharmacy Consult for TPN monitoring (electrolyte and BG) Indication: ETOH related encephalopathy, malnutrition  Patient Measurements: Height: 6\' 1"  (185.4 cm) Weight: 163 lb (73.9 kg) (Simultaneous filing. User may not have seen previous data.) IBW/kg (Calculated) : 79.9 TPN AdjBW (KG): 73 Body mass index is 21.51 kg/m.  Insulin requirements in the past 24 hours: none   Lytes:  BMP Latest Ref Rng & Units 06/21/2016 06/19/2016 06/18/2016  Glucose 65 - 99 mg/dL 98 161(W124(H) 960(A109(H)  BUN 6 - 20 mg/dL 15 54(U24(H) 98(J21(H)  Creatinine 0.61 - 1.24 mg/dL 1.91(Y0.46(L) 7.82(N0.54(L) 5.62(Z0.57(L)  Sodium 135 - 145 mmol/L 134(L) 133(L) 133(L)  Potassium 3.5 - 5.1 mmol/L 3.8 3.8 3.4(L)  Chloride 101 - 111 mmol/L 103 105 104  CO2 22 - 32 mmol/L 24 22 23   Calcium 8.9 - 10.3 mg/dL 9.7 9.5 9.2   Magnesium  Date Value Ref Range Status  06/19/2016 1.7 1.7 - 2.4 mg/dL Final   Phosphorus  Date Value Ref Range Status  06/19/2016 4.2 2.5 - 4.6 mg/dL Final   3/5  TG 48   Best Practices:  TPN Access: PICC TPN start date: 2/28    Plan:   Will continue TPN Clinimix E 5/20 to 65 ml/hr as per dietary recommendation and add lipids 20% at 20 ml/hr x 12 hrs as per dietary recommendations. Will add thiamine and folic acid to each TPN.   Diet: Has soft diet ordered but poor/no intake d/t Altered mental status. Tube feeds attempted, but patient refused NG tube SSI and blood glucose checks q6h.  SSI /24hrs= 0. IV fluid regimen: None  Pt not a good candidate for long term TPN or tube feeds per IM- palliative care [facility]  Electrolytes:   Will obtain follow up electrolytes on 3/12.     Olene FlossMelissa D Jeral Zick, Pharm.D, BCPS Clinical Pharmacist  06/22/2016 1:42 PM

## 2016-06-22 NOTE — Progress Notes (Addendum)
Daily Progress Note   Patient Name: Willie Baker                                                                                 Date: 06/22/2016 DOB: 07/23/1960  Age: 56 y.o. MRN#: [id number] Attending Physician: Delfino LovettVipul Shah, MD Primary Care Physician: No PCP Per Patient Admit Date: 05/24/2016  Reason for Consultation/Follow-up: Establishing goals of care and Non pain symptom management  HPI: 56 y.o.malewith past medical history of chronic alcoholism admitted on 2/7/2018with hematemesis, N/V. Workup revealed illeus andlikely gastritis d/t alcohol abuse. During admission he experienced severe alcohol withdrawal requiring precedex sedation in ICU. He also became febrile requiring cooling blankets. He has poor nutritional status and is not eating and drinking- has been receiving IV TPN, but no po intake. Prealbumin yesterday was 5.6. Since coming off of sedation his mental status has not improved. He continues to experience hallucinations, moments of agitation and aggression, and is not eating, drinking, or taking po medications. Incontinent of urine and stool.He has been diagnosed with Korsakoff's dementia that has not reversed despite IV replacement of nutrition. He remains agitated, showing aggression towards staff, refusing medical interventions.  Subjective:  Patient agitation better controlled with scheduled lorazepam. Appears more comfortable, able to flex legs. No longer lying in fetal position. Continues to refuse po intake. Family has been difficult to reach.   0820-0900 (40 minutes):   Received no callback from family yesterday.  This morning called [name]- son- his voicemail is full.  Spoke via phone with Enore- after again explaining patient's state of dementia (lost ability to create memory second to second, creating a  constant state of fear and anxiety unless he has someone there that is familiar to him prior to the encephalopathy) she is agreeable to Hospice. However, Enore is not a Horticulturist, commerciallegal decision maker.   Spoke via phone with patient's cousin, Cristal FordVanessa- Vanessa visited last evening. She also is aware of reality of situation and believes Hospice is in best interest of patient. She recommends continue to try and [facility] and meet with him alone with MD present as well.   Addendum 10:17: Spoke with GrenadaBrittany- patient's son's girlfriend- [phone]- Per GrenadaBrittany- [name] is at home sleeping. Informed Gren[place]Brittany that medical team had been [facility] over the last 24 hours with no response. GrenadaBrittany stated she planned to bring [name] to hospital at 4:30. I requested [name]'s address so that we may contact him sooner. She declined to give it to me, but said she would send someone to wake him up and arrange transportation to the hospital today. Gave my GrenadaBrittany my contact information and requested she call me with time that [name] will be here and also requested that [name] call me.  Addendum 1051: [name] will be here at 1500 for meeting with Palliative, SW, and MD.  Addendum 1500-1630 (90 minutes): Extended time family  meeting with patient's son, Dr. Cora Daniels, LCSW and other family members present. Patient's diagnosis and prognosis, attempted interventions and recommendations discussed at length. Final decisions made to transition patient to comfort care and and residential Hospice. Patient requires significant symptom management due to extreme agitation when not sedated d/t Korsakoff's dementia.  Recommendations/Plan: -DNR -Referral to Care Management for Residential Hospice -Continue IV hydromorphone and lorazepam as ordered -D/C IV TPN -Comfort Measures -Delirium Precautions  Prognosis:   <2wks d/t severe Korsokoff dementia, no po intake, transition to comfort measures  only  Discharge Planning  Residential Hospice  Greater than 50%  of this time was spent counseling and coordinating care related to the above assessment and plan.  Time in: 0820 Time Out: 0900 Time In: 1500 Time Out: 1630  Total time: 130 minutes Prolonged Services Billed: Yes  Please call Palliative Medicine team phone with any questions [phone]. For individual providers please see AMION.  Ocie Bob, AGNP-C Palliative Medicine

## 2016-06-22 NOTE — Progress Notes (Signed)
New hospice home referral received from [facility]. Writer met with patient's son Elie Goody, and other family members involved in his care to initiate education regarding hospice services, philosophy and team approach to care with understanding voiced. Consents signed. Writer to follow up with hospital care team and family tomorrow 3/9 regarding transfer to the hospice home. Thank you. Flo Shanks RN, BSN, [facility] [place], hospital Liaison [phone] c

## 2016-06-22 NOTE — Progress Notes (Signed)
Family Meeting Note  Advance Directive:no  Today a meeting took place with the Patient and Son, Step-mother and other family members at bedside and in family room.  Patient is unable to participate due LO:VFIEPPto:Lacked capacity Sedated   The following clinical team members were present during this meeting:MD, Social worker and Palliative care   The following were discussed:Patient's diagnosis: , Patient's progosis: < 6 months and Goals for treatment: DNR  Additional follow-up to be provided: Palliative care - Strongly consider Hospice Home  Time spent during discussion:20 minutes  Willie LovettVipul Trudy Kory, MD

## 2016-06-23 MED ORDER — MORPHINE SULFATE 20 MG/5ML PO SOLN: 5.0000 mg | ORAL | 0 refills | Status: DC | PRN

## 2016-06-23 MED ORDER — HYDROMORPHONE HCL 1 MG/ML IJ SOLN: 0.5000 mg | INTRAMUSCULAR | 0 refills | Status: AC

## 2016-06-23 MED ORDER — GLYCOPYRROLATE 0.2 MG/ML IJ SOLN: 0.2000 mg | INTRAMUSCULAR | 0 refills | Status: DC | PRN

## 2016-06-23 MED ORDER — HALOPERIDOL LACTATE 5 MG/ML IJ SOLN: 1.0000 mg | INTRAMUSCULAR | 0 refills | Status: DC | PRN

## 2016-06-23 MED ORDER — GLYCOPYRROLATE 0.2 MG/ML IJ SOLN: 0.2000 mg | INTRAMUSCULAR | 0 refills | Status: AC | PRN

## 2016-06-23 MED ORDER — LORAZEPAM 2 MG/ML IJ SOLN: 2.0000 mg | INTRAMUSCULAR | 0 refills | Status: AC

## 2016-06-23 MED ORDER — HALOPERIDOL LACTATE 5 MG/ML IJ SOLN: 1.0000 mg | INTRAMUSCULAR | 0 refills | Status: AC | PRN

## 2016-06-23 NOTE — Clinical Social Work Note (Signed)
Pt is ready for discharge today and will go to [facility]. Hospital Liaison has made arrangements for pt to transfer. CSW prepared discharge packet. CSW is signing off as no further needs identified.   Dede QuerySarah Calla Wedekind, MSW, LCSW Clinical Social Worker  [phone]

## 2016-06-23 NOTE — Discharge Summary (Addendum)
[employer] [facility] [place] [facility]   PATIENT NAME: Willie Baker    MR#:  [id number]  DATE OF BIRTH:  07/08/1960  DATE OF ADMISSION:  05/24/2016   ADMITTING PHYSICIAN: Arnaldo NatalMichael S Diamond, MD  DATE OF DISCHARGE: 06/23/2016  PRIMARY CARE PHYSICIAN: No PCP Per Patient   ADMISSION DIAGNOSIS:  Hyponatremia [E87.1] Nausea and vomiting, intractability of vomiting not specified, unspecified vomiting type [R11.2] DISCHARGE DIAGNOSIS:  Principal Problem:   Korsakoff's psychosis, alcohol related (HCC) Active Problems:   Tachycardia   Ileus (HCC)   Abdominal pain of unknown etiology   Hyponatremia   SBO (small bowel obstruction)   Alcohol abuse   Protein-calorie malnutrition, severe   Palliative care by specialist   Goals of care, counseling/discussion   Confusion and disorientation   Advance care planning  SECONDARY DIAGNOSIS:  History reviewed. No pertinent past medical history. HOSPITAL COURSE:  56 yo male with no past medical history presented to the ED with abdominal pain and non-bloody emesis. Admitted for persistent tachycardia.  1. Alcohol abuse/withdrawal 2. 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 Pt has had no improvement.  3. Poor po intake  4. hyponatremia 5. Hypokalemia/Hypomagnesemia 6. Small bowel obstruction  7. Fever of unknown origin  8.Chronic tachycardia 9.  Severe protein calorie malnutrition DISCHARGE CONDITIONS:  poor CONSULTS OBTAINED:   DRUG ALLERGIES:   Allergies  Allergen Reactions  . Milk-Related Compounds    DISCHARGE MEDICATIONS:   Allergies as of 06/23/2016      Reactions   Milk-related Compounds       Medication List    TAKE these medications   glycopyrrolate 0.2 MG/ML injection Commonly known as:  ROBINUL Inject 1 mL (0.2 mg total) into the vein every 4 (four) hours as needed (excessive secretions).   haloperidol lactate 5 MG/ML injection Commonly known as:  HALDOL Inject 0.2-0.4  mLs (1-2 mg total) into the vein every 4 (four) hours as needed.   HYDROmorphone 1 MG/ML injection Commonly known as:  DILAUDID Inject 0.5 mLs (0.5 mg total) into the vein every 4 (four) hours.   LORazepam 2 MG/ML injection Commonly known as:  ATIVAN Inject 1 mL (2 mg total) into the vein every 4 (four) hours.      DISCHARGE INSTRUCTIONS:   DIET:  Pleasure feeds DISCHARGE CONDITION:  Critical ACTIVITY:  Bedrest OXYGEN:  Home Oxygen: No.  Oxygen Delivery: room air DISCHARGE LOCATION:  Hospice home  If you experience worsening of your admission symptoms, develop shortness of breath, life threatening emergency, suicidal or homicidal thoughts you must seek medical attention immediately by calling 911 or calling your MD immediately  if symptoms less severe.  You Must read complete instructions/literature along with all the possible adverse reactions/side effects for all the Medicines you take and that have been prescribed to you. Take any new Medicines after you have completely understood and accpet all the possible adverse reactions/side effects.   Please note  You were cared for by a hospitalist during your hospital stay. If you have any questions about your discharge medications or the care you received while you were in the hospital after you are discharged, you can call the unit and asked to speak with the hospitalist on call if the hospitalist that took care of you is not available. Once you are discharged, your primary care physician will handle any further medical issues. Please note that NO REFILLS for any discharge medications will be authorized once you are discharged, as it  is imperative that you return to your primary care physician (or establish a relationship with a primary care physician if you do not have one) for your aftercare needs so that they can reassess your need for medications and monitor your lab values.    On the day of Discharge:  VITAL SIGNS:  Blood  pressure 136/75, pulse (!) 106, temperature 98 F (36.7 C), temperature source Oral, resp. rate 16, height 6\' 1"  (1.854 m), weight 73.9 kg (163 lb), SpO2 100 %. PHYSICAL EXAMINATION:  GENERAL:  56 y.o.-year-old patient lying in the bed with no acute distress.  He looks cachectic and severely malnourished EYES: Pupils equal, round, reactive to light and accommodation. No scleral icterus. Extraocular muscles intact.  HEENT: Head atraumatic, normocephalic. Oropharynx and nasopharynx clear.  NECK:  Supple, no jugular venous distention. No thyroid enlargement, no tenderness.  LUNGS: Normal breath sounds bilaterally, no wheezing, rales,rhonchi or crepitation. No use of accessory muscles of respiration.  CARDIOVASCULAR: S1, S2 normal. No murmurs, rubs, or gallops.  ABDOMEN: Soft, non-tender, non-distended. Bowel sounds present. No organomegaly or mass.  EXTREMITIES: No pedal edema, cyanosis, or clubbing.  NEUROLOGIC: Nonfocal PSYCHIATRIC: The patient is sleepy SKIN: No obvious rash, lesion, or ulcer.  DATA REVIEW:   CBC  Recent Labs Lab 06/19/16 0519  WBC 10.1  HGB 10.2*  HCT 29.9*  PLT 203    Chemistries   Recent Labs Lab 06/19/16 0519 06/21/16 1244  NA 133* 134*  K 3.8 3.8  CL 105 103  CO2 22 24  GLUCOSE 124* 98  BUN 24* 15  CREATININE 0.54* 0.46*  CALCIUM 9.5 9.7  MG 1.7  --   AST 42*  --   ALT 24  --   ALKPHOS 57  --   BILITOT 0.6  --     Follow-up Information    [facility]. Go in 1 day(s).   Specialty:  Hospice and Palliative Medicine Contact information: [address] [place] [address] [phone]          He has extremely poor prognosis he is being discharged to hospice home.  Management plans discussed with the patient, family and they are in agreement.  CODE STATUS: DNR -comfort care  TOTAL TIME TAKING CARE OF THIS PATIENT: 45 minutes.    Delfino Lovett M.D on 06/23/2016 at 10:36 AM  Between 7am to 6pm - Pager -  [phone]  After 6pm go to [email or web address] - Scientist, research (life sciences) [facility]  Office  [phone]  CC: Primary care physician; No PCP Per Patient   Note: This dictation was prepared with Dragon dictation along with smaller phrase technology. Any transcriptional errors that result from this process are unintentional.

## 2016-06-23 NOTE — Progress Notes (Signed)
Follow up visit made to new hospice home referral. Mr. Willie Baker is a 56 year old man [facility] for treatment of nausea/vomting, he was found to have a small bowel obstruction, no surgery performed. He has had a very complicated hospital course related to ETOH withdrawal requiring IV drip sedation. 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 TPN was started, again with no improvement. Palliative Medicine has been involved for several days and after a family meeting yesterday afternoon the decision made to focus on his comfort at the hospice home. He is currently requiring IV dilaudid 1 mg q 4 hours as well as IV ativan 2 mg Q 4 hours and this morning required a PRN dose of Haldol 1 mg IV for agitation. Discharge summary faxed to referral. Report called to the hospice home.EMS notified for transport. Willie Baker, patient's girlfriend present in the room, notified of EMS call, unable to reach patient's son Willie Baker, unable to leave a message. Hospital care team all aware. Signed portable DNR in place in patient's discharge packet. Thank you. Willie BarkerKaren Robertson RN, BSN, [facility] [place] swell, [facility] [phone] c

## 2016-06-23 NOTE — Discharge Instructions (Signed)
End-of-Life Care What is end-of-life care? End-of-life care is the physical, emotional, mental, and spiritual care you receive during the days, weeks, or months while you are dying. Your end-of-life care team may include:  Health care providers.  A Education officer, museum.  A spiritual adviser. The goal of end-of-life care is to give you the highest quality of life possible at the end of your life. What are the different types of end-of-life care? There are several different kinds of care options. Palliative care  This type of care does not treat or cure a disease. The goal is to manage your symptoms. These may include:  Pain.  Constipation.  Nausea. Palliative care teams often also include support for family members and loved ones. You might need palliative care for months or years. Hospice care  This is a kind of palliative care ordered and provided by your health care providers. A hospice care team can also help and support your loved ones. Comfort care  This type of care is for meeting your basic needs and maintaining your overall comfort at the end of your life. This includes caring for your:  Skin.  Breathing.  Nutrition.  Rest.  Temperature. A plan for comfort care can also address the mental, emotional, and spiritual issues that may come up at the end of your life. Where does end-of-life care take place? End-of-life care can take place wherever you are living, as long as you get the care you need. End-of-life care can happen:  At your home.  In a nursing home.  In a hospital.  In a critical care unit. You and your loved ones might be able to decide where end-of-life care takes place. This decision depends on:  Your comfort.  Your wishes.  The medical equipment you need. How do I know when it is time for end-of-life care? Your health care provider might tell you that there are no more treatments left to try or that treatment can no longer control your illness. Or,  you may decide that you do not want to undergo the treatments that are available. Talk to your health care provider and your loved ones about your end-of-life care options. If possible, have this conversation before you need this type of care. Discuss:  How much medical treatment you want during end-of-life care.  Where you would like to live while you are dying.  What kinds of treatments you would accept to keep you comfortable.  Which treatments you would refuse.  Your faith or spiritual needs at the end of your life.  Who will handle practical details, such as wills and finances. You can create legal documents (advance directives) to let your loved ones know your wishes for end-of-life care. Talk to your health care provider or a lawyer about making a living will that explains your medical wishes. You can also have a medical power of attorney. This designates a person to make health decisions for you if you cannot make them yourself. This information is not intended to replace advice given to you by your health care provider. Make sure you discuss any questions you have with your health care provider. Document Released: 10/29/2013 Document Revised: 03/17/2016 Document Reviewed: 06/27/2013 Elsevier Interactive Patient Education  2015-06-08 [place].    Death and Dying When a person's health care team determines that a terminal illness can no longer be controlled, medical testing and treatment often stop. But the person's care continues. The care focuses on making the person comfortable. The person  receives medicines and treatments to control pain and other symptoms, such as constipation, nausea, and shortness of breath. Some people remain at home during this time, while others enter a hospice or other facility. Either way, services are available to help individuals and their families with the medical, psychological, and spiritual issues surrounding dying. A hospice team often provides such  services. The time at the end of life is different for each person. Individuals and their families have unique needs for information and support. Family members often want to know how long their loved one is expected to live. This is a hard question to answer. Factors such as where the disease is located and whether the person has other illnesses can affect how long a person is expected to live. Questions and concerns about the end of life should be discussed with the health care team as they arise. When to ask for assistance When caring for your loved one at home, there may be times when you need assistance from your loved one's health care team. You can contact the health care team for help in any of the following situations:  Your loved one is in pain that is not relieved by the prescribed dose of pain medicine.  Your loved one shows discomfort, such as grimacing or moaning.  Your loved one is having trouble breathing and seems upset.  Your loved one is unable to urinate or empty the bowels.  Your loved one has fallen.  Your loved one is very depressed or talking about committing suicide.  You have difficulty giving medicine to your loved one.  You are overwhelmed by caring for your loved one or are too grieved or afraid to be with your loved one.  Any time you do not know how to handle a situation. Providing care and comfort Managing pain   Contact the health care provider if the prescribed pain-relieving medicine dose does not seem to help. With the help of the health care team, you can also explore methods such as massage and relaxation techniques to help with pain.  Reposition your loved ones body from one side to the back to the other side every few hours to prevent bed sores. Try to minimize pressure under heels and elbows by placing a pillow or foam pads where needed. Giving comfort   Keep your loved one as clean, dry, and comfortable as possible. Place disposable pads on the  bed beneath the person and remove them when they become soiled.  Blankets can be used to warm your loved one. Although your loved one's skin may be cool, he or she is usually not aware of feeling cold. You should avoid warming your loved one with electric blankets or heating pads. These can cause burns.  Breathing may be easier if you turn your loved one's body to the side and place pillows beneath the head and behind the back. Although labored breathing can sound very distressing to you, gurgling and rattling sounds do not cause discomfort to your loved one. An external source of oxygen may benefit some individuals. If your loved one is able to swallow, ice chips also may help. In addition, a cool mist humidifier may help make your loved one's breathing more comfortable. You may also use a fan to circulate the air.  Allow your loved one to choose if and when to eat or drink. Ice chips, water, or juice may be refreshing if the person can swallow. Keep the person's mouth and lips moist with  products such as glycerin swabs and lip balm. Emotional support Everyone has different needs, but some emotions are common to most individuals who are dying. These include fear of abandonment and fear of being a burden. They also have concerns about loss of dignity and loss of control. Some ways you can provide comfort are as follows:  Keep your loved one company. Talk, watch movies, read, or just be with him or her.  Allow your loved one to express fears and concerns about dying, such as leaving family and friends behind. Be prepared to listen.  Be willing to reminisce about your loved one's life.  Avoid withholding difficult information. Most people prefer to be included in discussions about issues that concern them.  Reassure your loved one that you will honor advance directives, such as living wills.  Ask if there is anything you can do.  Respect your loved one's need for privacy. Communicating with a  dying loved one  Plan visits and activities for times when your loved one is alert. It is important to speak directly to your loved one and talk as if he or she can hear, even if there is no response. Most people are still able to hear after they are no longer able to speak. Your loved one should not be shaken if he or she does not respond.  Gently remind your loved one of the time, date, and people who are present. If your loved one is agitated, do not attempt to restrain him or her. Be calm and reassuring. Speaking calmly may help to re-orient your loved one. What to expect when death is near Certain signs and symptoms can help you anticipate when death is near. They are described below, along with suggestions for managing them. It is important to remember that not every person experiences each of the signs and symptoms. Having one or more of these symptoms does not always indicate that your loved one is close to death. A member of your loved one's health care team can give you information about what to expect.  Drowsiness, increased sleep, or unresponsiveness. This is caused by changes in the person's metabolism.  Confusion about time, place, or identity of family and friends; restlessness; visions of people and places that are not present; pulling at bed linens or clothing (caused in part by changes in your loved ones metabolism).  Withdrawal and decreased socialization. This may be caused by decreased oxygen to the brain, decreased blood flow, and mental preparation for dying.  Decreased need for food and fluids, and loss of appetite. This is caused by the body's need to conserve energy and its decreasing ability to use food and fluids properly.  Loss of bladder or bowel control. This is caused by the relaxing of muscles in the pelvic area. You can talk to your loved one's health care team about the possibility of inserting a catheter. A member of the health care team can teach you how to take  care of the catheter, if one is needed.  Skin becomes cool to the touch, particularly the hands and feet. Skin may become bluish in color, especially on the underside of the body. This is caused by decreased circulation to the extremities.  Rattling or gurgling sounds while breathing that may be loud; breathing that is irregular and shallow; decreased number of breaths per minute; breathing that alternates between rapid and slow. This is caused by congestion from fluid, a buildup of waste products in the body, and a decrease  in circulation to the organs.  Turning the head toward a light source. This is caused by decreasing vision. Leave soft, indirect lights on in the room.  Increased difficulty controlling pain. This is caused by progression of the disease. It is important to provide pain medicines as your loved one's health care provider has prescribed.  Involuntary movements, changes in heart rate, and loss of reflexes in the legs and arms are also signs that the end of life is near. What are the signs that the person has died?  There is no breathing or pulse.  The eyes do not move or blink, and the pupils are enlarged (dilated) and do not change with light. The eyelids may be slightly open.  The jaw is relaxed, and the mouth is slightly open.  The body releases the bowel and bladder contents.  The person does not respond to being touched or spoken to. What should happen after the person has died? After the person has passed away, there is no need to hurry with arrangements. Family members and close friends may wish to sit with the person, talk, or pray. When the family is ready, the following steps can be taken: 1. Place your loved one on his or her back with one pillow under the head. If necessary, you may wish to put your loved ones dentures or other artificial parts in place. 2. If your loved one is in a hospice program, follow the guidelines provided by the program. You can request  a hospice nurse to verify the person's death. 3. Contact the appropriate authorities in accordance with local regulations. If your loved one has requested not to be resuscitated through a Do Not Resuscitate (DNR) order or other mechanism, do not call 911. 4. Contact your loved one's health care provider and funeral home. 5. Contact other family members, friends, and clergy who may not be aware of your loved one's passing. 6. Obtain emotional support to cope with the loss of your loved one. Questions to ask about death and dying  How long is the person expected to live? Where to find more information:  [facility] and Palliative Care Organization: http://www.brown-buchanan.com/  National Institute on Aging: http://kim-miller.com/ This information is not intended to replace advice given to you by your health care provider. Make sure you discuss any questions you have with your health care provider. Document Released: 01/11/2008 Document Revised: 09/09/2015 Document Reviewed: 09/22/2012 Elsevier Interactive Patient Education  2017 [place] is a service that is designed to provide people who are terminally ill and their families with medical, spiritual, and psychological support. Its aim is to improve your quality of life by keeping you as alert and comfortable as possible. Who will be my providers when I begin hospice care? Hospice teams often include:  A nurse.  A doctor. The hospice doctor will be available for your care, but you can bring your regular doctor or nurse practitioner.  Social workers.  Religious leaders (such as a Clinical biochemist).  Trained volunteers. What roles will providers play in my care? Hospice is performed by a team of health care professionals and volunteers who:  Help keep you comfortable:  Hospice can be provided in your home or in a homelike setting.  The hospice staff works with your family and friends to help meet your needs.  You will  enjoy the support of loved ones by receiving much of your basic care from family and friends.  Provide pain relief and  manage your symptoms. The staff supply all necessary medicines and equipment.  Provide companionship when you are alone.  Allow you and your family to rest. They may do light housekeeping, prepare meals, and run errands.  Provide counseling. They will make sure your emotional, spiritual, and social needs and those of your family are being met.  Provide spiritual care:  Spiritual care will be individualized to meet your needs and your family's needs.  Spiritual care may involve:  Helping you look at what death means to you.  Helping you say goodbye to your family and friends.  Performing a specific religious ceremony or ritual. When should hospice care begin? Most people who use hospice are believed to have fewer than 6 months to live.  Your family and health care providers can help you decide when hospice services should begin.  If your condition improves, you may discontinue the program. What should I consider before selecting a program? Most hospice programs are run by nonprofit, independent organizations. Some are affiliated with hospitals, nursing homes, or home health care agencies. Hospice programs can take place in the home or at a hospice center, hospital, or skilled nursing facility. When choosing a hospice program, ask the following questions:  What services are available to me?  What services will be offered to my loved ones?  How involved will my loved ones be?  How involved will my health care provider be?  Who makes up the hospice care team? How are they trained or screened?  How will my pain and symptoms be managed?  If my circumstances change, can the services be provided in a different setting, such as my home or in the hospital?  Is the program reviewed and licensed by the state or certified in some other way? Where can I learn more  about hospice? You can learn about existing hospice programs in your area from your health care providers. You can also read more about hospice online. The websites of the following organizations contain helpful information:  The [facility] and Palliative Care Organization [facility]).  The Hospice Association of America ([place]).  The [employer].  The American Cancer Society (ACS).  Hospice Net. This information is not intended to replace advice given to you by your health care provider. Make sure you discuss any questions you have with your health care provider. Document Released: 07/21/2003 Document Revised: 11/18/2015 Document Reviewed: 02/11/2013 Elsevier Interactive Patient Education  2017 [employer].

## 2016-07-16 DEATH — deceased

## 2017-05-02 IMAGING — MR MR HEAD W/O CM
8 of 10 series · 33 of 48 positions shown · non-contrast
Comparison: Head CT 09/10/2015

CLINICAL DATA: Altered mental status and confusion. Alcohol
withdrawal.

EXAM:
MRI HEAD WITHOUT CONTRAST
TECHNIQUE: Multiplanar, multiecho pulse sequences of the brain and surrounding
structures were obtained without intravenous contrast.

[Series 3: DWI · axial · 4.0mm · 0.94mm/px · z∈[-102,+73]mm · 5 of 45 slices shown (1 of 2)]
[im 1/45]
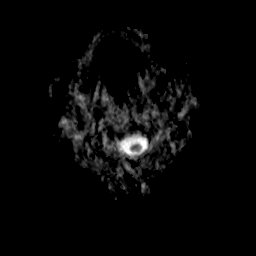
[im 12/45]
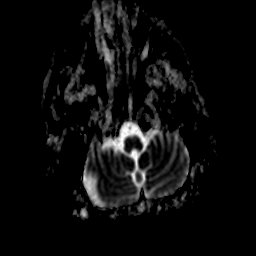
[im 23/45]
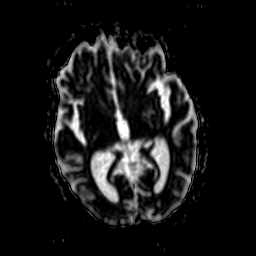
[im 34/45]
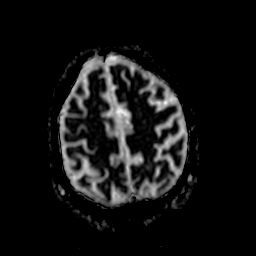
[im 45/45]
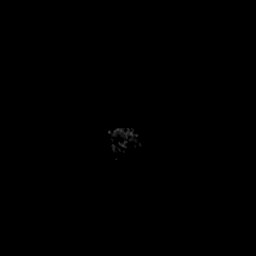

[Series 5: DWI · coronal · 5.0mm · 1.80mm/px · 5 of 42 slices shown (2 of 2)]
[im 1/42]
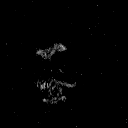
[im 11/42]
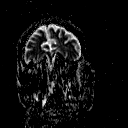
[im 21/42]
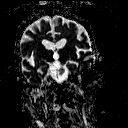
[im 31/42]
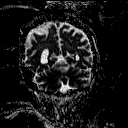
[im 42/42]
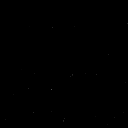

[Series 6: FLAIR · axial · 5.0mm · 0.90mm/px · z∈[-95,+73]mm · 4 of 27 slices shown]
[im 1/27]
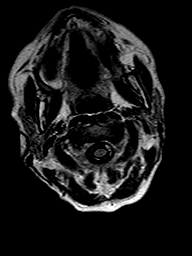
[im 9/27]
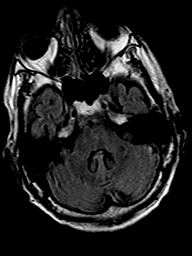
[im 18/27]
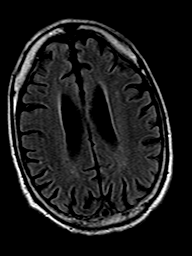
[im 27/27]
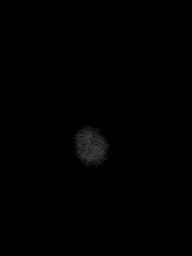

[Series 7: T1 · sagittal · 5.0mm · 0.45mm/px · 4 of 29 slices shown (1 of 2)]
[im 1/29]
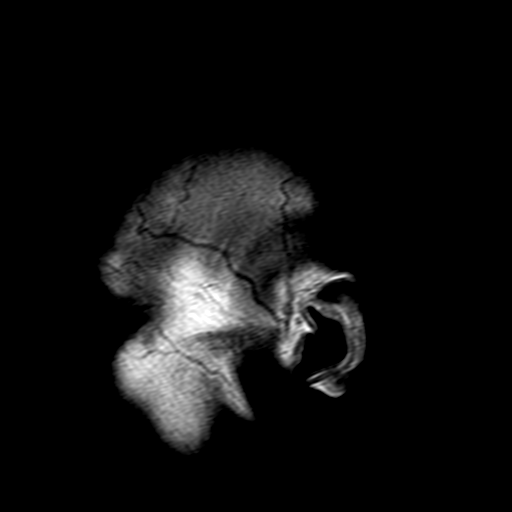
[im 10/29]
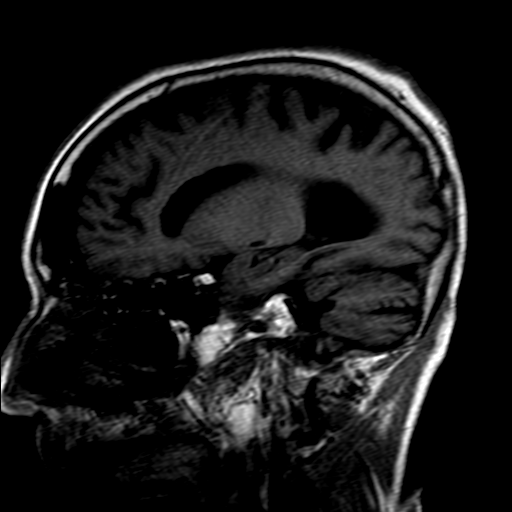
[im 19/29]
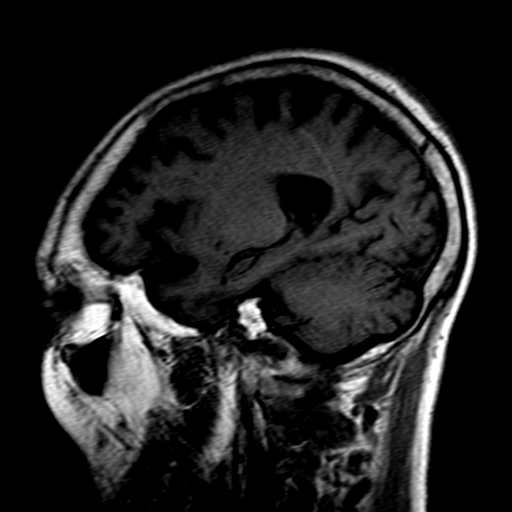
[im 29/29]
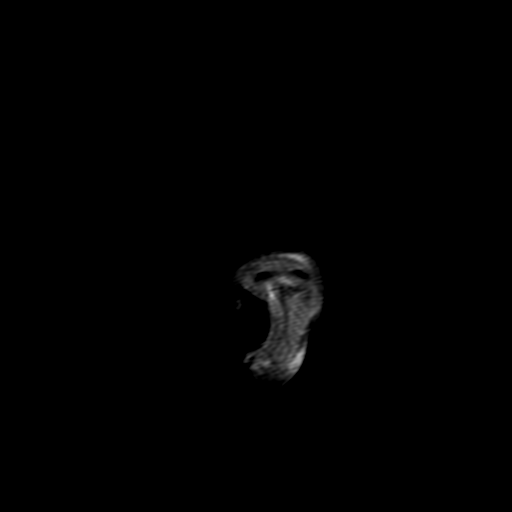

[Series 9: T2 · axial · 5.0mm · 0.45mm/px · z∈[-96,+72]mm · 4 of 27 slices shown (1 of 3)]
[im 1/27]
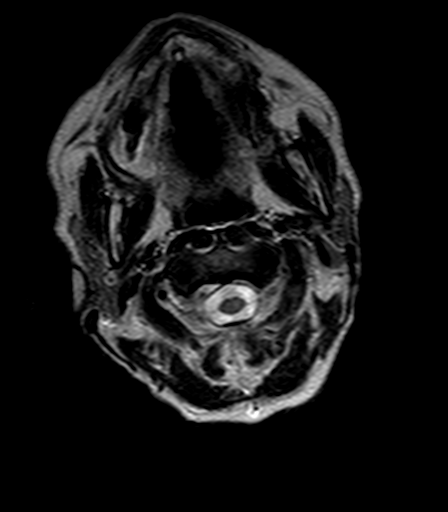
[im 9/27]
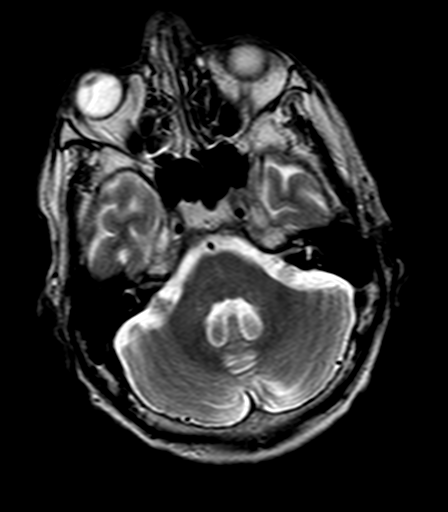
[im 18/27]
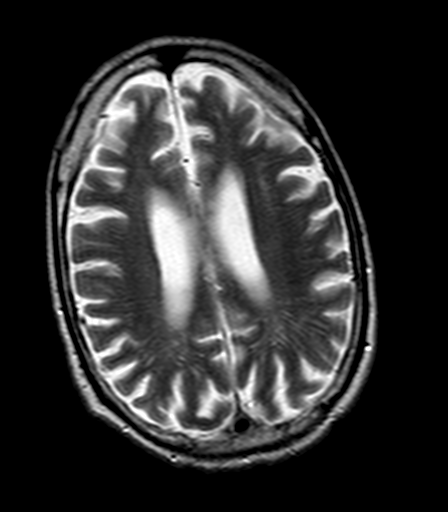
[im 27/27]
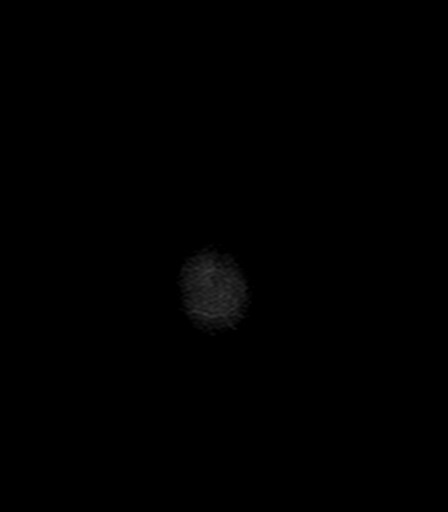

[Series 11: T2 · axial · 5.0mm · 0.45mm/px · z∈[-96,+72]mm · 4 of 27 slices shown (2 of 3)]
[im 1/27]
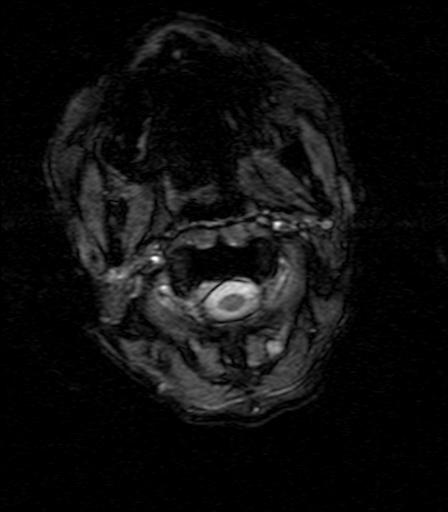
[im 9/27]
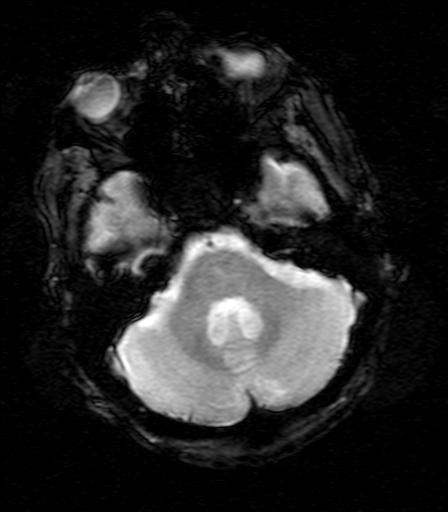
[im 18/27]
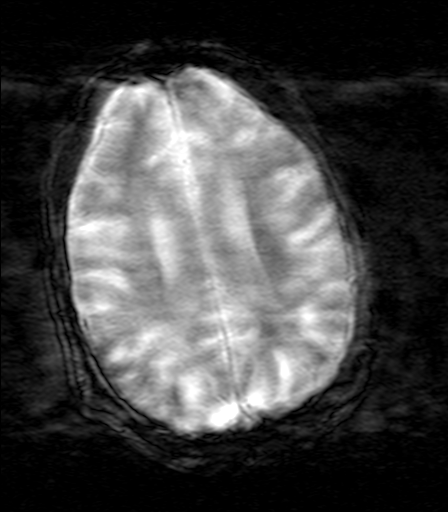
[im 27/27]
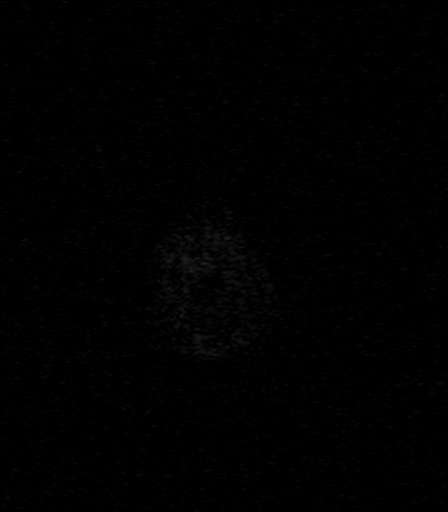

[Series 12: T1 · axial · 3.0mm · 0.45mm/px · z∈[-94,-40]mm · 3 of 56 slices shown (2 of 2)]
[im 1/56]
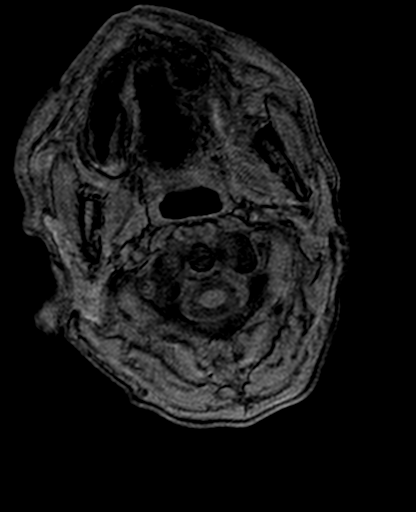
[im 10/56]
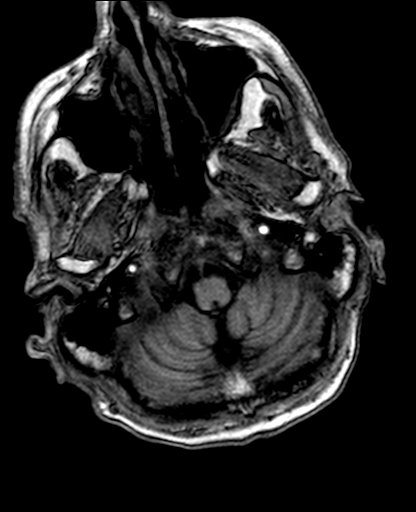
[im 19/56]
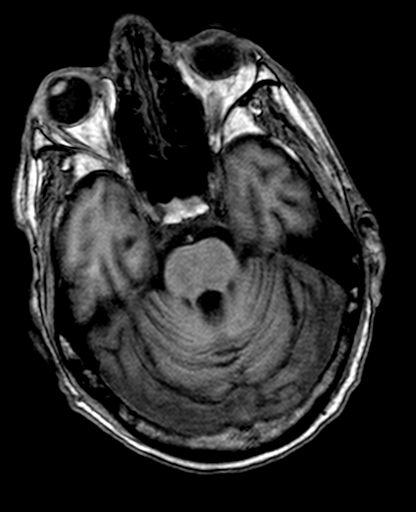

[Series 13: T2 · coronal · 5.0mm · 0.45mm/px · 4 of 33 slices shown (3 of 3)]
[im 1/33]
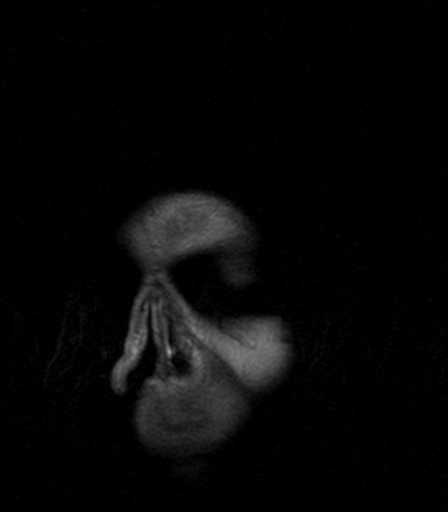
[im 11/33]
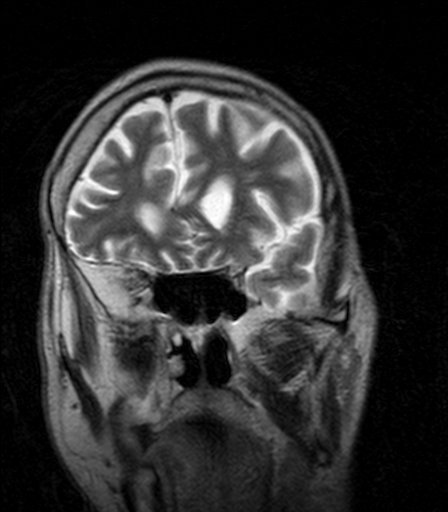
[im 22/33]
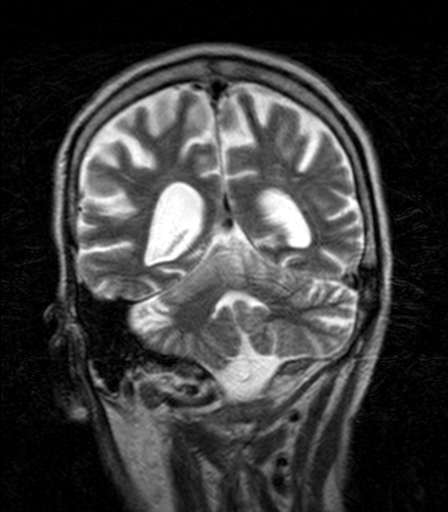
[im 33/33]
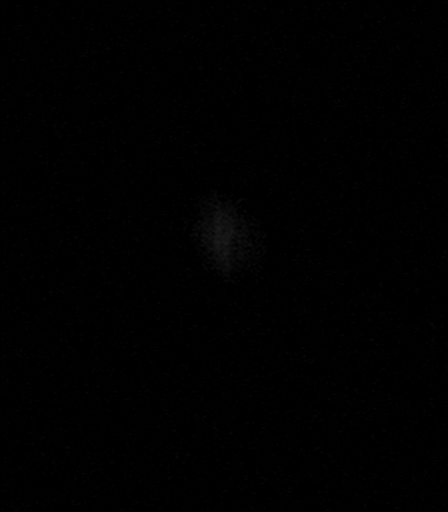

[33 of 48 positions shown; findings below may reference images not displayed]

FINDINGS: Brain: Intermittently motion degraded exam. No acute infarct,
hemorrhage, hydrocephalus, or mass. FLAIR signal at the level of the
cerebral aqua duct is likely artifactual. No signal abnormality
noted in the deep gray nuclei to increase concern for Safiyullah.
The mamillary bodies are not well covered. No signs of osmotic
demyelination. Generalized atrophy that is age advanced. No chronic
blood products noted.

Vascular: Preserved flow voids

Skull and upper cervical spine: No marrow lesion noted.

Sinuses/Orbits: No acute finding
IMPRESSION: 1. No acute finding.
2. Age advanced atrophy.
